# Patient Record
Sex: Male | Born: 1990 | Race: White | Hispanic: No | Marital: Single | Smoking: Never smoker
Health system: Southern US, Community
[De-identification: ages and names within clinical notes are randomized; demographics above are authoritative.]

## PROBLEM LIST (undated history)

## (undated) DIAGNOSIS — Q2381 Bicuspid aortic valve: Secondary | ICD-10-CM

## (undated) DIAGNOSIS — I77810 Thoracic aortic ectasia: Secondary | ICD-10-CM

## (undated) DIAGNOSIS — Q059 Spina bifida, unspecified: Secondary | ICD-10-CM

## (undated) HISTORY — DX: Bicuspid aortic valve: Q23.81

## (undated) HISTORY — DX: Thoracic aortic ectasia: I77.810

---
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
## 2009-02-14 ENCOUNTER — Emergency Department (HOSPITAL_COMMUNITY): Admission: EM | Admit: 2009-02-14 | Discharge: 2009-02-14 | Payer: Self-pay | Admitting: Emergency Medicine

## 2009-10-25 DIAGNOSIS — N319 Neuromuscular dysfunction of bladder, unspecified: Secondary | ICD-10-CM | POA: Insufficient documentation

## 2009-10-25 DIAGNOSIS — G911 Obstructive hydrocephalus: Secondary | ICD-10-CM | POA: Insufficient documentation

## 2009-10-25 DIAGNOSIS — Q051 Thoracic spina bifida with hydrocephalus: Secondary | ICD-10-CM | POA: Insufficient documentation

## 2009-10-25 DIAGNOSIS — K592 Neurogenic bowel, not elsewhere classified: Secondary | ICD-10-CM | POA: Insufficient documentation

## 2009-10-25 DIAGNOSIS — M4120 Other idiopathic scoliosis, site unspecified: Secondary | ICD-10-CM | POA: Insufficient documentation

## 2009-10-25 DIAGNOSIS — J309 Allergic rhinitis, unspecified: Secondary | ICD-10-CM | POA: Insufficient documentation

## 2010-08-17 ENCOUNTER — Ambulatory Visit: Payer: 59 | Attending: Family Medicine | Admitting: Physical Therapy

## 2010-08-17 DIAGNOSIS — R293 Abnormal posture: Secondary | ICD-10-CM | POA: Insufficient documentation

## 2010-08-17 DIAGNOSIS — IMO0001 Reserved for inherently not codable concepts without codable children: Secondary | ICD-10-CM | POA: Insufficient documentation

## 2010-08-17 DIAGNOSIS — R262 Difficulty in walking, not elsewhere classified: Secondary | ICD-10-CM | POA: Insufficient documentation

## 2010-09-24 LAB — BASIC METABOLIC PANEL
Calcium: 9.4 mg/dL (ref 8.4–10.5)
GFR calc Af Amer: >60 mL/min (ref >60)
GFR calc non Af Amer: >60 mL/min (ref >60)
Glucose, Bld: 89 mg/dL (ref 70–99)
Potassium: 3.5 mEq/L (ref 3.5–5.1)
Sodium: 138 mEq/L (ref 135–145)

## 2010-09-24 LAB — CBC
HCT: 45.4 % (ref 39.0–52.0)
Hemoglobin: 15.4 g/dL (ref 13.0–17.0)
RBC: 4.97 MIL/uL (ref 4.22–5.81)
RDW: 13.1 % (ref 11.5–15.5)

## 2010-09-24 LAB — URINALYSIS, ROUTINE W REFLEX MICROSCOPIC
Bilirubin Urine: NEGATIVE
Hgb urine dipstick: NEGATIVE
Nitrite: NEGATIVE
Specific Gravity, Urine: 1.017 (ref 1.005–1.030)
pH: 6.5 (ref 5.0–8.0)

## 2010-09-24 LAB — DIFFERENTIAL
Eosinophils Relative: 0 % (ref 0–5)
Lymphocytes Relative: 12 % (ref 12–46)
Lymphs Abs: 1.5 10*3/uL (ref 0.7–4.0)
Monocytes Absolute: 0.9 10*3/uL (ref 0.1–1.0)
Monocytes Relative: 7 % (ref 3–12)
Neutro Abs: 10.3 10*3/uL — ABNORMAL HIGH (ref 1.7–7.7)

## 2012-04-25 DIAGNOSIS — G919 Hydrocephalus, unspecified: Secondary | ICD-10-CM | POA: Insufficient documentation

## 2012-05-10 DIAGNOSIS — M216X9 Other acquired deformities of unspecified foot: Secondary | ICD-10-CM | POA: Insufficient documentation

## 2014-03-05 ENCOUNTER — Ambulatory Visit: Payer: 59 | Attending: Family Medicine | Admitting: Physical Therapy

## 2014-03-05 DIAGNOSIS — IMO0001 Reserved for inherently not codable concepts without codable children: Secondary | ICD-10-CM | POA: Diagnosis present

## 2014-03-05 DIAGNOSIS — M6281 Muscle weakness (generalized): Secondary | ICD-10-CM | POA: Diagnosis not present

## 2014-03-20 DIAGNOSIS — H5015 Alternating exotropia: Secondary | ICD-10-CM | POA: Insufficient documentation

## 2015-01-06 DIAGNOSIS — Z9889 Other specified postprocedural states: Secondary | ICD-10-CM | POA: Insufficient documentation

## 2019-02-18 DIAGNOSIS — N200 Calculus of kidney: Secondary | ICD-10-CM | POA: Insufficient documentation

## 2019-07-31 ENCOUNTER — Ambulatory Visit: Payer: Medicaid Other | Admitting: Physical Therapy

## 2019-09-02 ENCOUNTER — Other Ambulatory Visit: Payer: Self-pay

## 2019-09-02 ENCOUNTER — Ambulatory Visit: Payer: Medicaid Other | Attending: Family Medicine | Admitting: Physical Therapy

## 2019-09-02 DIAGNOSIS — R2689 Other abnormalities of gait and mobility: Secondary | ICD-10-CM | POA: Insufficient documentation

## 2019-09-02 DIAGNOSIS — M6281 Muscle weakness (generalized): Secondary | ICD-10-CM | POA: Insufficient documentation

## 2019-09-03 ENCOUNTER — Encounter: Payer: Self-pay | Admitting: Physical Therapy

## 2019-09-03 NOTE — Therapy (Signed)
[facility] [address] [place], [place], [address] Phone: [phone]   Fax:  [phone]  Physical Therapy Evaluation  Patient Details  Name: Samuel Becker MRN: [id number] Date of Birth: July 10, 1990 Referring Provider (PT): Suzanna Obey, MD   Encounter Date: 09/02/2019  PT End of Session - 09/03/19 1903    Visit Number  1    PT Start Time  8887    PT Stop Time  5797    PT Time Calculation (min)  72 min    Activity Tolerance  Patient tolerated treatment well    Behavior During Therapy  [facility] for tasks assessed/performed       History reviewed. No pertinent past medical history.  History reviewed. No pertinent surgical history.  There were no vitals filed for this visit.   Subjective Assessment - 09/03/19 1900    Subjective  Pt presents for manual wheelchair eval accompanied by his father - K5 ultralightweight manual wheelchair recommended ([employer])    Patient is accompained by:  Family member   father   Pertinent History  spina bifida    Patient Stated Goals  obtain new manual wheelchair    Currently in Pain?  No/denies         [facility] PT Assessment - 09/03/19 0001      Assessment   Medical Diagnosis  Spina Bifida    Referring Provider (PT)  Suzanna Obey, MD    Onset Date/Surgical Date  --   Congenital     Precautions   Precautions  Fall      Balance Screen   Has the patient fallen in the past 6 months  No    Has the patient had a decrease in activity level because of a fear of falling?   No    Is the patient reluctant to leave their home because of a fear of falling?   No                Objective measurements completed on examination: See above findings.         Mobility/Seating Evaluation    PATIENT INFORMATION: Name: Samuel Becker DOB: May 24, 1991  Sex: M Date seen: 09-02-19 Time: 1230  Address:  [address]                 [place], [place] [address] Physician: Suzanna Obey, MD This evaluation/justification form will serve as the LMN for the following suppliers: __________________________ Supplier: [employer] Contact Person: Deberah Pelton, PT Phone:  [phone]   Seating Therapist: Guido Sander, PT Phone:   [phone]   Phone: [phone]    Spouse/Parent/Caregiver name: Samuel Becker   Phone number: [phone] Insurance/Payer: Medicaid      Reason for Referral: manual wheelchair evaluation    Patient/Caregiver Goals: obtain new manual wheelchair for independence with mobility  Patient was seen for face-to-face evaluation for new manual wheelchair.  Also present was Deberah Pelton, ATP with [employer] to discuss recommendations and wheelchair options.  Further paperwork was completed and sent to vendor.  Patient appears to qualify for manual mobility device at this time per objective findings.   MEDICAL HISTORY: Diagnosis: Primary Diagnosis: Spina Bifida Onset: Congenital Diagnosis: ?????   _0 Progressive Disease Relevant past and future surgeries: ?????   Height: 5'4" Weight: 100# Explain recent changes or trends in weight: ?????   History including Falls: Pt reports no falls within past 6 months; pt is using manual wheelchair for independence with mobility  HOME ENVIRONMENT: _0 House  _1 Condo/town home  _2 Apartment  _3 Assisted Living    _4 Lives Alone _5  Lives with Others                                                                                          Hours with caregiver: 18+  _6 Home is accessible to patient           Stairs      _7 Yes _8  No     Ramp _9 Yes _10 No Comments:  pt lives in basement apartment of family home   COMMUNITY ADL: TRANSPORTATION: _11 Car    _12 Van    <GPQDIYMEBRAXENMM>_7<\/WKGSUPJSRPRXYVOP>_92 Public Transportation    _14 Adapted w/c Lift    _15 Ambulance    _16 Other:       _17 Sits in wheelchair during transport  Employment/School: previously volunteered at [employer]  Specific requirements pertaining to mobility ?????  Other: pt previously rode SCAT   prior to Covid - sits in wheelchair during transport when using SCAT      FUNCTIONAL/SENSORY PROCESSING SKILLS:  Handedness:   _18 Right     _19 Left    _20 NA  Comments:  pt uses bil. UE's to propel manual wheelchair  Functional Processing Skills for Wheeled Mobility _21 Processing Skills are adequate for safe wheelchair operation  Areas of concern than may interfere with safe operation of wheelchair Description of problem   _22  Attention to environment      _23 Judgment      _24  Hearing  _25  Vision or visual processing      _26 Motor Planning  _27  Fluctuations in Behavior  ?????    VERBAL COMMUNICATION: _28 WFL receptive _29  WFL expressive _30 Understandable  _31 Difficult to understand  _32 non-communicative _33  Uses an augmented communication device  CURRENT SEATING / MOBILITY: Current Mobility Base:  _34 None _35 Dependent _36 Manual _37 Scooter _38 Power  Type of Control: ?????  Manufacturer:  Leticia Penna TiLiteSize:  16 x 16 Age: 35 yrs  Current Condition of Mobility Base:  in disrepair   Current Wheelchair components:  ?????  Describe posture in present seating system:  ?????      SENSATION and SKIN ISSUES: Sensation _39 Intact  _40 Impaired _41 Absent  Level of sensation: impaired sensation in bil. feet per pt report Pressure Relief: Able to perform effective pressure relief :    _42 Yes  _43  No Method: ???? If not, Why?: ?????  Skin Issues/Skin Integrity Current Skin Issues  _44 Yes _45 No _46 Intact _47  Red area_48  Open Area  _49 Scar Tissue _50 At risk from prolonged sitting Where  ?????  History of Skin Issues  _51 Yes _52 No Where  ????? When  ?????  Hx of skin flap surgeries  _53 Yes _54 No Where  ????? When  ?????  Limited sitting tolerance _55 Yes _56 No Hours spent sitting in wheelchair daily: 15+  Complaint of Pain:  Please describe: None   Swelling/Edema: none   ADL STATUS (in reference to wheelchair use):  Indep Assist Unable Indep with Equip Not assessed Comments  Dressing ????? ????? ????? X ?????  performs from wheelchair  Eating X ????? ????? ????? ????? ?????  Toileting ????? X ????? ????? ????? has a Pristine unit; caregiver pumps system up & then unhooks it  Bathing ????? ????? ?????  X ????? has a roll in shower; has a rolling shower chair   Grooming/Hygiene ????? ????? ????? X ????? performs from wheelchair  Meal Prep ????? X ????? X ????? performs from wheelchair for light meal prep  IADLS ????? X ????? ????? ????? supervision for safety  Bowel Management: _0 Continent  _1 Incontinent  _2 Accidents Comments:  ?????  Bladder Management: _3 Continent  _4 Incontinent  _5 Accidents Comments:  ?????     WHEELCHAIR SKILLS: Manual w/c Propulsion: _6 UE or LE strength and endurance sufficient to participate in ADLs using manual wheelchair Arm : _7 left _8 right   _9 Both      Distance: 100'+  Foot:  _10 left _11 right   _12 Both  Operate Scooter: _13  Strength, hand grip, balance and transfer appropriate for use _14 Living environment is accessible for use of scooter  Operate Power w/c:  _15  Std. Joystick   _16  Alternative Controls Indep _17  Assist _18  Dependent/unable _19  N/A _20   _21 Safe          _22  Functional      Distance: ?????  Bed confined without wheelchair _23  Yes _24  No   STRENGTH/RANGE OF MOTION:  Active  Range of Motion Strength  Shoulder bil. shoulder flexion and abdct. WNL's Bil. shoulder flexors 4+/5;  Rt shoulder abdct = 3+/5:  Lt = 4/5  Elbow bil. elbow AROM WNL's 5/5 bil. elbow flexors & extensors  Wrist/Hand bil. wrist/hand AROM WNL's WNL's bil. wrist and hand musc.  Hip PROM WFL's - AROM is decreased due to weakness bil. hip flexors 2+/5  Knee Rt passive knee extension -30 degrees:  Lt knee passive knee ext. -38 degrees; min. active knee extension and flexion due to weakness bil. quads 2 - 2+/5  Ankle No active ankle ROM; passive dorsiflexion to neutral 1/5 bil. ankle musc.     MOBILITY/BALANCE:  _25  Patient is totally dependent for mobility  ?????    Balance Transfers  Ambulation  Sitting Balance: Standing Balance: _26  Independent _27  Independent/Modified Independent  _28  WFL     _29  WFL _30  Supervision _31  Supervision  _32  Uses UE for balance  _33  Supervision _34  Min Assist _35  Ambulates with Assist  ?????    _36  Min Assist _37  Min assist _38  Mod Assist _39  Ambulates with Device:      _40  RW  _41  StW  _42  Cane  _43  ?????  _44  Mod Assist _45  Mod assist _46  Max assist   _47  Max Assist _48  Max assist _49  Dependent _50  Indep. Short Distance Only  _51  Unable _52  Unable _53  Lift / Sling Required Distance (in feet)  ?????   _54  Sliding board _55  Unable to Ambulate (see explanation below)  Cardio Status:  _56 Intact  _57  Impaired   _58  NA     ?????  Respiratory Status:  _59 Intact   _60 Impaired   _61 NA     ?????  Orthotics/Prosthetics: pt wearing bil. AFO's  Comments (Address manual vs power w/c vs scooter): Pt requires a K5 ultra lightweight manual wheelchair for independence and safety with mobility and ADL's in his home environment.  Pt requires an ultra lightweight wheelchair for ease with propulsion on all surfaces and terrains.  An ultra lightweight manual wheelchair is also needed for axle adjustability for increased stability for postural control for Mr. Breighner due to his decreased trunk control and trunk musculature weakness.           Anterior / Posterior Obliquity Rotation-Pelvis ?????  PELVIS    _62  _63  _64   Neutral Posterior Anterior  _65  _66  _67   WFL Rt  elev Lt elev  _0  _1  _2   WFL Right Left                      Anterior    Anterior     _3  Fixed _4  Other _5  Partly Flexible _6  Flexible   _7  Fixed _8  Other _9  Partly Flexible  _10  Flexible  _11  Fixed _12  Other _13  Partly Flexible  _14  Flexible   TRUNK  _15  _16  _17   WFL ? Thoracic ? Lumbar  Kyphosis Lordosis  _18  _19  _20   WFL Convex Convex  Right Left _21 c-curve _22 s-curve _23 multiple  _24  Neutral _25  Left-anterior _26  Right-anterior     _27  Fixed _28  Flexible _29  Partly Flexible _30  Other  _31  Fixed _32  Flexible _33  Partly  Flexible _34  Other  _35  Fixed             _36  Flexible _37  Partly Flexible _38  Other    Position Windswept  LLE shorter than RLE  HIPS          _39            _40               _41    Neutral       Abduct        ADduct         _42           _43            _44   Neutral Right           Left      _45  Fixed _46  Subluxed _47  Partly Flexible _48  Dislocated _49  Flexible  _50  Fixed _51  Other _52  Partly Flexible  _53  Flexible                 Foot Positioning Knee Positioning  Rt foot is externally rotated - (pt in seated position)    _54  WFL  _55 Lt _56 Rt _57  WFL  _58 Lt _59 Rt    KNEES ROM concerns: ROM concerns:    & Dorsi-Flexed _60 Lt _61 Rt ?????    FEET Plantar Flexed _62 Lt _63 Rt      Inversion                 _64 Lt _65 Rt      Eversion                 _66 Lt _67 Rt     HEAD _68  Functional _69  Good Head Control  ?????  & _70  Flexed         _71  Extended _72  Adequate Head Control    NECK _73  Rotated  Lt  _74  Lat Flexed Lt _75  Rotated  Rt _76  Lat Flexed Rt _77  Limited Head Control     _78  Cervical Hyperextension _79  Absent  Head Control     SHOULDERS ELBOWS WRIST& HAND ?????      Left     Right    Left     Right    Left     Right   U/E _80 Functional           _81 Functional WNL's WNL's _82 Fisting             _83 Fisting      _84 elev   _85 dep      _86 elev   _87 dep       _88 pro -_89 retract     _90 pro  _91 retract _92 subluxed             _93 subluxed  Goals for Wheelchair Mobility  _0  Independence with mobility in the home with motor related ADLs (MRADLs)  _1  Independence with MRADLs in the community _2  Provide dependent mobility  _3  Provide recline     _4 Provide tilt   Goals for Seating system _5  Optimize pressure distribution _6  Provide support needed to facilitate function or safety _7  Provide corrective forces to assist with maintaining or improving posture _8  Accommodate client's posture:   current seated postures and positions are not flexible or will not tolerate corrective forces _9  Client to be independent with  relieving pressure in the wheelchair _10 Enhance physiological function such as breathing, swallowing, digestion  Simulation ideas/Equipment trials:????? State why other equipment was unsuccessful:?????   MOBILITY BASE RECOMMENDATIONS and JUSTIFICATION: MOBILITY COMPONENT JUSTIFICATION  Manufacturer: TiLiteModel: Aero X    Size: Width 14Seat Depth 15 _11 provide transport from point A to B      _12 promote Indep mobility  _13 is not a safe, functional ambulator _14 walker or cane inadequate _15 non-standard width/depth necessary to accommodate anatomical measurement _16  ?????  _17 Manual Mobility Base _18 non-functional ambulator    _19 Scooter/POV  _20 can safely operate  _21 can safely transfer   _22 has adequate trunk stability  _23 cannot functionally propel manual w/c  _24 Power Mobility Base  _25 non-ambulatory  _26 cannot functionally propel manual wheelchair  _27  cannot functionally and safely operate scooter/POV _28 can safely operate and willing to  _29 Stroller Base _30 infant/child  _31 unable to propel manual wheelchair _32 allows for growth _33 non-functional ambulator _34 non-functional UE _35 Indep mobility is not a goal at this time  _36 Tilt  _37 Forward _38 Backward _39 Powered tilt  _40 Manual tilt  _41 change position against gravitational force on head and shoulders  _42 change position for pressure relief/cannot weight shift _43 transfers  _44 management of tone _45 rest periods _46 control edema _47 facilitate postural control  _48  ?????  _49 Recline  _50 Power recline on power base _51 Manual recline on manual base  _52 accommodate femur to back angle  _53 bring to full recline for ADL care  _54 change position for pressure relief/cannot weight shift _55 rest periods _56 repositioning for transfers or clothing/diaper /catheter changes _57 head positioning  _58 Lighter weight required _59 self- propulsion  _60 lifting _61  ?????  _62 Heavy Duty required _63 user weight greater than 250# _64 extreme tone/ over active movement _65 broken frame on  previous chair _66  ?????  _67  Back  _68  Angle Adjustable _69  Custom molded Acta Relief by [place] - 14"x16"  _70 postural control _71 control of tone/spasticity _72 accommodation of range of motion _73 UE functional control _74 accommodation for seating system _75  ????? _76 provide lateral trunk support _77 accommodate deformity _78 provide posterior trunk support _79 provide lumbar/sacral support _80 support trunk in midline _81 Pressure relief over spinal processes  _82  Seat Cushion 14"x 14" Jay Fusion with air bladder _83 impaired sensation  _84 decubitus ulcers present _85 history of pressure ulceration _86 prevent pelvic extension _87 low maintenance  _88 stabilize pelvis  _89 accommodate obliquity _90 accommodate multiple deformity _91 neutralize lower extremity position _92 increase pressure distribution _93  at risk with prolonged sitting  _94  Pelvic/thigh support  _95  Lateral thigh guide _96  Distal medial pad  _97  Distal lateral pad _98  pelvis in neutral _99 accommodate pelvis _100  position upper legs _101  alignment _102  accommodate ROM _103  decr adduction _104 accommodate tone _105 removable for transfers _106 decr abduction  _107  Lateral trunk Supports _108  Lt     _109  Rt _110 decrease lateral trunk leaning _111 control tone _112 contour for increased contact _113 safety  _114 accommodate asymmetry _115  ?????  _116  Mounting hardware  _117 lateral trunk supports  _118 back   _119 seat _120 headrest      _121  thigh support _122 fixed   _123 swing away _124 attach seat platform/cushion to w/c frame _125 attach back cushion to w/c frame _126 mount postural  supports _0 mount headrest  _1 swing medial thigh support away _2 swing lateral supports away for transfers  _3  ?????    Armrests  _4 fixed _5 adjustable height _6 removable   _7 swing away  _8 flip back   _9 reclining _10 full length pads _11 desk    _12 pads tubular  _13 provide support with elbow at 90   _14 provide support for w/c tray _15 change of height/angles for variable activities  _16 remove for transfers _17 allow to come closer to table top _18 remove for access to tables _19  ?????  Hangers/ Leg rests  _20 60 _21 70 _22 90 _23 elevating _24 heavy duty  _25 articulating _26 fixed _27 lift off _28 swing away     _29 power _30 provide LE support  _31 accommodate to hamstring tightness _32 elevate legs during recline   _33 provide change in position for Legs _34 Maintain placement of feet on footplate _35 durability _36 enable transfers _37 decrease edema _38 Accommodate lower leg length _39  ?????  Foot support Footplate    <ALPFXTKWIOXBDZHG>_9<\/JMEQASTMHDQQIWLN>_98 Lt  _41  Rt  _42  Center mount _43 flip up     _44 depth/angle adjustable _45 Amputee adapter    _46  Lt     _47  Rt _48 provide foot support _49 accommodate to ankle ROM _50 transfers _51 Provide support for residual extremity _52  allow foot to go under wheelchair base _53  decrease tone  _54  Calf strap for lower leg positioning and for safety   _55  Ankle strap/heel loops _56 support foot on foot support _57 decrease extraneous movement _58 provide input to heel  _59 protect foot  Tires: _60 pneumatic  _61 flat free inserts  _62 solid  _63 decrease maintenance  _64 prevent frequent flats _65 increase shock absorbency _66 decrease pain from road shock _67 decrease spasms from road shock _68  ?????  _69  Headrest  _70 provide posterior head support _71 provide posterior neck support _72 provide lateral head support _73 provide anterior head support _74 support during tilt and recline _75 improve feeding   _76 improve respiration _77 placement of switches _78 safety  _79 accommodate ROM  _80 accommodate tone _81 improve visual orientation  _82  Anterior chest strap _83  Vest _84  Shoulder retractors  _85 decrease forward movement of shoulder _86 accommodation of TLSO _87 decrease forward movement of trunk _88 decrease shoulder elevation _89 added abdominal support _90 alignment _91 assistance with shoulder control  _92  ?????  Pelvic Positioner _93 Belt _94 SubASIS bar _95 Dual Pull _96 stabilize tone _97 decrease falling out of chair/ **will  not Decr potential for sliding due to pelvic tilting _98 prevent excessive rotation _99 pad for protection over boney prominence _100 prominence comfort _101 special pull angle to control rotation _102  ?????  Upper Extremity Support _103 L   _104  R _105 Arm trough    _106 hand support _107  tray       _108 full tray _109 swivel mount _110 decrease edema      _111 decrease subluxation   _112 control tone   _113 placement for AAC/Computer/EADL _114 decrease gravitational pull on shoulders _115 provide midline positioning _116 provide support to increase UE function _117 provide hand support in natural position _118 provide work surface   POWER WHEELCHAIR CONTROLS  _119 Proportional  _120 Non-Proportional Type ????? _121 Left  _122 Right _123 provides access for controlling wheelchair   _124 lacks motor control to operate proportional drive control <XQJJHERDEYCXKGYJ>_8<\/HUDJSHFWYOVZCHYI>_502 unable to understand proportional controls  Actuator Control Module  _126 Single  _127 Multiple   _128 Allow the client to operate the power seat function(s) through the joystick control   _129 Safety Reset Switches _130 Used to change modes and stop the wheelchair when driving in latch mode    _131 Upgraded Electronics   _132 programming for accurate control _133 progressive Disease/changing condition _134 non-proportional drive control needed _135 Needed in order to operate power seat functions through joystick control   _136 Display box _137 Allows user to see in which mode and drive the wheelchair is set  _138 necessary for alternate controls    _139 Digital interface electronics _140 Allows w/c to operate when  using alternative drive controls  <YIRSWNIOEVOJJKKX>_3<\/GHWEXHBZJIRCVELF>_8 ASL Head Array _1 Allows client to operate wheelchair  through switches placed in tri-panel headrest  _2 Sip and puff with tubing kit _3 needed to operate sip and puff drive controls  <BOFBPZWCHENIDPOE>_4<\/MPNTIRWERXVQMGQQ>_7 Upgraded tracking electronics _5 increase safety when driving <YPPJKDTOIZTIWPYK>_9<\/XIPJASNKNLZJQBHA>_1 correct tracking when on uneven surfaces  _7 [facility] for switches or joystick _8 Attaches switches to w/c  _9 Swing away for access or transfers _10 midline for optimal  placement _11 provides for consistent access  _12 Attendant controlled joystick plus mount _13 safety _14 long distance driving <PFXTKWIOXBDZHGDJ>_2<\/EQASTMHDQQIWLNLG>_92 operation of seat functions _16 compliance with transportation regulations _17  ?????    Rear wheel placement/Axle adjustability _18 None _19 semi adjustable _20 fully adjustable  _21 improved UE access to wheels _22 improved stability _23 changing angle in space for improvement of postural stability _24 1-arm drive access <JJHERDEYCXKGYJEH>_6<\/DJSHFWYOVZCHYIFO>_27 amputee pad placement _26  ?????  Wheel rims/ hand rims  _27 metal  _28 plastic coated _29 oblique projections _30 vertical projections _31 Provide ability to propel manual wheelchair  _32  Increase self-propulsion with hand weakness/decreased grasp  Push handles _33 extended  _34 angle adjustable  _35 standard _36 caregiver access _37 caregiver assist _38 allows "hooking" to enable increased ability to perform ADLs or maintain balance  One armed device  _39 Lt   _40 Rt _41 enable propulsion of manual wheelchair with one arm   _42  ?????   Brake/wheel lock extension _43  Lt   _44  Rt _45 increase indep in applying wheel locks   _46 Side guards _47 prevent clothing getting caught in wheel or becoming soiled _48  prevent skin tears/abrasions  Battery: ????? _49 to power wheelchair ?????  Other: Anti-tippers             6" x 1.5" casters x 2            Transit Tie Downs To prevent wheelchair from tipping backward during negotiation of curbs, inclines, etc.  For easier maneuverability of wheelchair on uneven surfaces/terrains  For safety - to allow wheelchair to be secured in vehicle during transit   ?????  The above equipment has a life- long use expectancy. Growth and changes in medical and/or functional conditions would be the exceptions. This is to certify that the therapist has no financial relationship with durable medical provider or manufacturer. The therapist will not receive remuneration of any kind for the equipment recommended in this evaluation.   Patient has mobility limitation  that significantly impairs safe, timely participation in one or more mobility related ADL's.  (bathing, toileting, feeding, dressing, grooming, moving from room to room)                                                             _50  Yes _51  No Will mobility device sufficiently improve ability to participate and/or be aided in participation of MRADL's?         _52  Yes _53  No Can limitation be compensated for with use of a cane or walker?                                                                                _54  Yes _55  No Does patient or caregiver demonstrate ability/potential ability &  willingness to safely use the mobility device?   _0  Yes _1  No Does patient's home environment support use of recommended mobility device?                                                    _2  Yes _3  No Does patient have sufficient upper extremity function necessary to functionally propel a manual wheelchair?    _4  Yes _5  No Does patient have sufficient strength and trunk stability to safely operate a POV (scooter)?                                  _6  Yes _7  No Does patient need additional features/benefits provided by a power wheelchair for MRADL's in the home?       _8  Yes _9  No Does the patient demonstrate the ability to safely use a power wheelchair?                                                              _10  Yes _11  No  Therapist Name Printed: Guido Sander, PT Date: 09-02-19  Therapist's Signature:   Date:   Supplier's Name Printed: Mamm[employer] Date: 09-02-19  Supplier's Signature:   Date:  Patient/Caregiver Signature:   Date:     This is to certify that I have read this evaluation and do agree with the content within:      Physician's Name Printed: Suzanna Obey, MD  28 Signature:  Date:     This is to certify that I, the above signed therapist have the following affiliations: _12  This DME provider _13  Manufacturer of recommended equipment _14  Patient's long term care  facility _15  None of the above                        Plan - 09/03/19 1903    Clinical Impression Statement  Pt is a 29 yr old gentleman with spina bifida, seen for manual wheelchair eval with Deberah Pelton, ATP with [employer] present for consult with eval.  A K5 ultra lightweight manual wheelchair (Aero X TiLite) is recommended.    Personal Factors and Comorbidities  Comorbidity 1    Comorbidities  spina bifida    Examination-Activity Limitations  Locomotion Level    Examination-Participation Restrictions  Community Activity;Interpersonal Relationship;Volunteer;Meal Prep;Laundry;Cleaning    Stability/Clinical Decision Making  Stable/Uncomplicated    Clinical Decision Making  Low    PT Frequency  One time visit    PT Treatment/Interventions  Other (comment)   w/c evaluation   Recommended Other Services  [employer] - manual wheelchair eval - Brandon Sisk, ATP    Consulted and Agree with Plan of Care  Patient;Family member/caregiver    Family Member Consulted  father       Patient will benefit from skilled therapeutic intervention in order to improve the following deficits and impairments:  Decreased mobility, Decreased balance, Decreased strength  Visit Diagnosis: Muscle weakness (generalized) - Plan: PT plan of care cert/re-cert  Other abnormalities of gait and mobility - Plan: PT plan of care  cert/re-cert     Problem List There are no problems to display for this patient.   Alda Lea, PT 09/03/2019, 7:16 PM  [facility] [address] [place], [place], [address] Phone: [phone]   Fax:  [phone]  Name: Samuel Becker MRN: [id number] Date of Birth: [birth year]

## 2021-05-26 ENCOUNTER — Emergency Department (HOSPITAL_BASED_OUTPATIENT_CLINIC_OR_DEPARTMENT_OTHER)
Admission: EM | Admit: 2021-05-26 | Discharge: 2021-05-27 | Disposition: A | Discharge status: Home / Self Care | Payer: Medicaid Other | Attending: Emergency Medicine | Admitting: Emergency Medicine

## 2021-05-26 ENCOUNTER — Emergency Department (HOSPITAL_BASED_OUTPATIENT_CLINIC_OR_DEPARTMENT_OTHER): Payer: Medicaid Other | Admitting: Radiology

## 2021-05-26 ENCOUNTER — Emergency Department (HOSPITAL_BASED_OUTPATIENT_CLINIC_OR_DEPARTMENT_OTHER): Payer: Medicaid Other

## 2021-05-26 ENCOUNTER — Other Ambulatory Visit: Payer: Self-pay

## 2021-05-26 ENCOUNTER — Encounter (HOSPITAL_BASED_OUTPATIENT_CLINIC_OR_DEPARTMENT_OTHER): Payer: Self-pay

## 2021-05-26 DIAGNOSIS — Z20822 Contact with and (suspected) exposure to covid-19: Secondary | ICD-10-CM | POA: Insufficient documentation

## 2021-05-26 DIAGNOSIS — R000 Tachycardia, unspecified: Secondary | ICD-10-CM | POA: Insufficient documentation

## 2021-05-26 DIAGNOSIS — R0602 Shortness of breath: Secondary | ICD-10-CM | POA: Insufficient documentation

## 2021-05-26 DIAGNOSIS — Z91040 Latex allergy status: Secondary | ICD-10-CM | POA: Diagnosis not present

## 2021-05-26 DIAGNOSIS — R109 Unspecified abdominal pain: Secondary | ICD-10-CM | POA: Diagnosis present

## 2021-05-26 HISTORY — DX: Spina bifida, unspecified: Q05.9

## 2021-05-26 LAB — CBC WITH DIFFERENTIAL/PLATELET
Abs Immature Granulocytes: 0.02 10*3/uL (ref 0.00–0.07)
Basophils Absolute: 0 10*3/uL (ref 0.0–0.1)
Basophils Relative: 0 %
Eosinophils Absolute: 0.1 10*3/uL (ref 0.0–0.5)
Eosinophils Relative: 1 %
HCT: 45.6 % (ref 39.0–52.0)
Hemoglobin: 15.3 g/dL (ref 13.0–17.0)
Immature Granulocytes: 0 %
Lymphocytes Relative: 13 %
Lymphs Abs: 0.8 10*3/uL (ref 0.7–4.0)
MCH: 30.5 pg (ref 26.0–34.0)
MCHC: 33.6 g/dL (ref 30.0–36.0)
MCV: 90.8 fL (ref 80.0–100.0)
Monocytes Absolute: 0.7 10*3/uL (ref 0.1–1.0)
Monocytes Relative: 12 %
Neutro Abs: 4.5 10*3/uL (ref 1.7–7.7)
Neutrophils Relative %: 74 %
Platelets: 165 10*3/uL (ref 150–400)
RBC: 5.02 MIL/uL (ref 4.22–5.81)
RDW: 11.9 % (ref 11.5–15.5)
WBC: 6.1 10*3/uL (ref 4.0–10.5)
nRBC: 0 % (ref 0.0–0.2)

## 2021-05-26 MED ORDER — IPRATROPIUM-ALBUTEROL 0.5-2.5 (3) MG/3ML IN SOLN
RESPIRATORY_TRACT | Status: AC
  Administered 2021-05-26: 3 mL via RESPIRATORY_TRACT
  Filled 2021-05-26: qty 3

## 2021-05-26 MED ORDER — SODIUM CHLORIDE 0.9 % IV BOLUS
1000.0000 mL | Freq: Once | INTRAVENOUS | Status: AC
  Administered 2021-05-27: 1000 mL via INTRAVENOUS

## 2021-05-26 MED ORDER — IPRATROPIUM-ALBUTEROL 0.5-2.5 (3) MG/3ML IN SOLN: 3.0000 mL | Freq: Once | RESPIRATORY_TRACT | Status: AC

## 2021-05-26 NOTE — ED Provider Notes (Signed)
[facility] EMERGENCY DEPT Provider Note   CSN: [id number] Arrival date & time: 05/26/21  2328     History Chief Complaint  Patient presents with   Respiratory Distress    Samuel Becker is a 30 y.o. male.  Patient is a 30 year old male with past medical history of spina bifida with recurrent urinary tract infections and performs home self caths.  Patient presenting today for evaluation of pain in his left side/flank for the past 3 days, then developed shortness of breath this evening after returning home from a movie with his parents.  He denies to me that he is having any fevers, chills, or cough.  He denies any ill contacts.  The history is provided by the patient.      Past Medical History:  Diagnosis Date   Spina bifida (HCC)     There are no problems to display for this patient.   No past surgical history on file.     No family history on file.     Home Medications Prior to Admission medications   Not on File    Allergies    Latex and Septra [sulfamethoxazole-trimethoprim]  Review of Systems   Review of Systems  All other systems reviewed and are negative.  Physical Exam Updated Vital Signs BP (!) 145/97 (BP Location: Left Arm)   Pulse (!) 130   Temp 99.7 F (37.6 C) (Oral)   Resp (!) 29   Ht 5\' 4"  (1.626 m)   Wt 4.99 kg   SpO2 99%   BMI 1.89 kg/m   Physical Exam Vitals and nursing note reviewed.  Constitutional:      General: He is not in acute distress.    Appearance: He is well-developed. He is not diaphoretic.  HENT:     Head: Normocephalic and atraumatic.  Cardiovascular:     Rate and Rhythm: Regular rhythm. Tachycardia present.     Heart sounds: No murmur heard.   No friction rub.  Pulmonary:     Effort: Pulmonary effort is normal. No respiratory distress.     Breath sounds: Normal breath sounds. No wheezing or rales.  Abdominal:     General: Bowel sounds are normal. There is no distension.     Palpations:  Abdomen is soft.     Tenderness: There is no abdominal tenderness.  Musculoskeletal:        General: Normal range of motion.     Cervical back: Normal range of motion and neck supple.  Skin:    General: Skin is warm and dry.  Neurological:     Mental Status: He is alert and oriented to person, place, and time.     Coordination: Coordination normal.    ED Results / Procedures / Treatments   Labs (all labs ordered are listed, but only abnormal results are displayed) Labs Reviewed  RESP PANEL BY RT-PCR (FLU A&B, COVID) [id number]  COMPREHENSIVE METABOLIC PANEL  CBC WITH DIFFERENTIAL/PLATELET  LACTIC ACID, PLASMA  LACTIC ACID, PLASMA  BRAIN NATRIURETIC PEPTIDE  TROPONIN I (HIGH SENSITIVITY)    EKG None  Radiology No results found.  Procedures Procedures   Medications Ordered in ED Medications  ipratropium-albuterol (DUONEB) 0.5-2.5 (3) MG/3ML nebulizer solution 3 mL (3 mLs Nebulization Given 05/26/21 2350)    ED Course  I have reviewed the triage vital signs and the nursing notes.  Pertinent labs & imaging results that were available during my care of the patient were reviewed by me and considered in my medical decision  making (see chart for details).    MDM Rules/Calculators/A&P  Patient with history of spina bifida presenting with complaints of left flank pain that started acutely this evening after returning home from a movie.  Patient in some discomfort and hyperventilating upon presentation and was placed on oxygen for comfort.  To my exam and work-up, I suspect this is musculoskeletal in nature.  He has no evidence of urinary tract infection, CT negative for renal calculus or other acute process, and laboratory studies are essentially unremarkable.  At this point, I feel as though patient can safely be discharged.  He is feeling better after receiving Toradol.  I highly suspect a musculoskeletal etiology.  Final Clinical Impression(s) / ED Diagnoses Final diagnoses:   None    Rx / DC Orders ED Discharge Orders     None        Geoffery Lyons, MD 05/27/21 [phone]

## 2021-05-26 NOTE — ED Notes (Signed)
RT placed pt on simple mask 6 Lpm for increased WOB and O2 sat of 93%. Pt respiratory status stable w/moderate respiratory distress at this time. Pt received Duo breathing treatment for SOB. Pt verbalized it helped his breathing. Pt sats on simple mask 98% w/some respiratory distress. RT will continue to monitor.

## 2021-05-26 NOTE — ED Triage Notes (Signed)
Two day history of shortness of breath that became worse app an hour PTA.

## 2021-05-27 ENCOUNTER — Emergency Department (HOSPITAL_BASED_OUTPATIENT_CLINIC_OR_DEPARTMENT_OTHER): Payer: Medicaid Other

## 2021-05-27 LAB — COMPREHENSIVE METABOLIC PANEL
ALT: 18 U/L (ref 0–44)
AST: 20 U/L (ref 15–41)
Albumin: 4.4 g/dL (ref 3.5–5.0)
Alkaline Phosphatase: 42 U/L (ref 38–126)
Anion gap: 10 (ref 5–15)
BUN: 11 mg/dL (ref 6–20)
CO2: 27 mmol/L (ref 22–32)
Calcium: 9.1 mg/dL (ref 8.9–10.3)
Chloride: 97 mmol/L — ABNORMAL LOW (ref 98–111)
Creatinine, Ser: 0.78 mg/dL (ref 0.61–1.24)
GFR, Estimated: >60 mL/min (ref >60)
Glucose, Bld: 102 mg/dL — ABNORMAL HIGH (ref 70–99)
Potassium: 3.7 mmol/L (ref 3.5–5.1)
Sodium: 134 mmol/L — ABNORMAL LOW (ref 135–145)
Total Bilirubin: 0.5 mg/dL (ref 0.3–1.2)
Total Protein: 6.8 g/dL (ref 6.5–8.1)

## 2021-05-27 LAB — URINALYSIS, ROUTINE W REFLEX MICROSCOPIC
Bilirubin Urine: NEGATIVE
Glucose, UA: NEGATIVE mg/dL
Ketones, ur: NEGATIVE mg/dL
Leukocytes,Ua: NEGATIVE
Nitrite: NEGATIVE
Protein, ur: NEGATIVE mg/dL
Specific Gravity, Urine: <1.005 — ABNORMAL LOW (ref 1.005–1.030)
pH: 5.5 (ref 5.0–8.0)

## 2021-05-27 LAB — RESP PANEL BY RT-PCR (FLU A&B, COVID) ARPGX2
Influenza A by PCR: NEGATIVE
Influenza B by PCR: NEGATIVE
SARS Coronavirus 2 by RT PCR: NEGATIVE

## 2021-05-27 LAB — LACTIC ACID, PLASMA: Lactic Acid, Venous: 1.8 mmol/L (ref 0.5–1.9)

## 2021-05-27 LAB — TROPONIN I (HIGH SENSITIVITY): Troponin I (High Sensitivity): 3 ng/L (ref <18)

## 2021-05-27 LAB — BRAIN NATRIURETIC PEPTIDE: B Natriuretic Peptide: 18.6 pg/mL (ref 0.0–100.0)

## 2021-05-27 MED ORDER — KETOROLAC TROMETHAMINE 30 MG/ML IJ SOLN
30.0000 mg | Freq: Once | INTRAMUSCULAR | Status: AC
  Administered 2021-05-27: 30 mg via INTRAVENOUS
  Filled 2021-05-27: qty 1

## 2021-05-27 NOTE — Discharge Instructions (Signed)
Take ibuprofen 400 mg every 6 hours as needed for pain.  Return to the emergency department if you develop worsening pain, difficulty breathing, high fever, bloody stool or vomit, or other new and concerning symptoms.

## 2021-05-28 ENCOUNTER — Other Ambulatory Visit: Payer: Self-pay

## 2021-05-28 ENCOUNTER — Encounter (HOSPITAL_BASED_OUTPATIENT_CLINIC_OR_DEPARTMENT_OTHER): Payer: Self-pay

## 2021-05-28 ENCOUNTER — Emergency Department (HOSPITAL_BASED_OUTPATIENT_CLINIC_OR_DEPARTMENT_OTHER)
Admission: EM | Admit: 2021-05-28 | Discharge: 2021-05-29 | Disposition: A | Discharge status: Home / Self Care | Payer: Medicaid Other | Attending: Emergency Medicine | Admitting: Emergency Medicine

## 2021-05-28 DIAGNOSIS — G8220 Paraplegia, unspecified: Secondary | ICD-10-CM | POA: Diagnosis not present

## 2021-05-28 DIAGNOSIS — R109 Unspecified abdominal pain: Secondary | ICD-10-CM | POA: Insufficient documentation

## 2021-05-28 DIAGNOSIS — K5900 Constipation, unspecified: Secondary | ICD-10-CM | POA: Diagnosis not present

## 2021-05-28 DIAGNOSIS — R0602 Shortness of breath: Secondary | ICD-10-CM | POA: Insufficient documentation

## 2021-05-28 DIAGNOSIS — R140 Abdominal distension (gaseous): Secondary | ICD-10-CM | POA: Insufficient documentation

## 2021-05-28 MED ORDER — IPRATROPIUM-ALBUTEROL 0.5-2.5 (3) MG/3ML IN SOLN
RESPIRATORY_TRACT | Status: AC
  Administered 2021-05-28: 3 mL via RESPIRATORY_TRACT
  Filled 2021-05-28: qty 3

## 2021-05-28 MED ORDER — IPRATROPIUM-ALBUTEROL 0.5-2.5 (3) MG/3ML IN SOLN: 3.0000 mL | Freq: Once | RESPIRATORY_TRACT | Status: AC

## 2021-05-28 NOTE — ED Triage Notes (Addendum)
Pt presents with ongoing SOB x2 days. Audible upper airway wheezing noted in triage. SOB worsens w/exertion. Pt seen here 2 days ago for the same. Had a tele visit w/PCP. Pt had some liquid diarrhea yesterday. No solid BM was Thursday. Pt has had 2 IBS pills and 2 enemas for abd distention and bloating. Pt is passing gas   Mom also reports mucus in his urine when he self cath

## 2021-05-28 NOTE — ED Notes (Signed)
RT gave pt duo nebulizer for wheezing and placed him on Simple mask for WOB and sats of 91% on RA. Pt states mask helps him breathe better. Pts respiratory status stable w/no distress noted w/mild increased WOB. RT will continue to monitor.

## 2021-05-29 ENCOUNTER — Emergency Department (HOSPITAL_BASED_OUTPATIENT_CLINIC_OR_DEPARTMENT_OTHER): Payer: Medicaid Other

## 2021-05-29 LAB — URINALYSIS, ROUTINE W REFLEX MICROSCOPIC
Bilirubin Urine: NEGATIVE
Glucose, UA: NEGATIVE mg/dL
Ketones, ur: 40 mg/dL — AB
Nitrite: NEGATIVE
Specific Gravity, Urine: 1.045 — ABNORMAL HIGH (ref 1.005–1.030)
pH: 6 (ref 5.0–8.0)

## 2021-05-29 LAB — CBC
HCT: 40.8 % (ref 39.0–52.0)
Hemoglobin: 13.7 g/dL (ref 13.0–17.0)
MCH: 30.6 pg (ref 26.0–34.0)
MCHC: 33.6 g/dL (ref 30.0–36.0)
MCV: 91.3 fL (ref 80.0–100.0)
Platelets: 169 10*3/uL (ref 150–400)
RBC: 4.47 MIL/uL (ref 4.22–5.81)
RDW: 12.1 % (ref 11.5–15.5)
WBC: 5.3 10*3/uL (ref 4.0–10.5)
nRBC: 0 % (ref 0.0–0.2)

## 2021-05-29 LAB — COMPREHENSIVE METABOLIC PANEL
ALT: 15 U/L (ref 0–44)
AST: 15 U/L (ref 15–41)
Albumin: 3.9 g/dL (ref 3.5–5.0)
Alkaline Phosphatase: 33 U/L — ABNORMAL LOW (ref 38–126)
Anion gap: 8 (ref 5–15)
BUN: 12 mg/dL (ref 6–20)
CO2: 27 mmol/L (ref 22–32)
Calcium: 9 mg/dL (ref 8.9–10.3)
Chloride: 104 mmol/L (ref 98–111)
Creatinine, Ser: 0.64 mg/dL (ref 0.61–1.24)
GFR, Estimated: >60 mL/min (ref >60)
Glucose, Bld: 84 mg/dL (ref 70–99)
Potassium: 3.8 mmol/L (ref 3.5–5.1)
Sodium: 139 mmol/L (ref 135–145)
Total Bilirubin: 0.4 mg/dL (ref 0.3–1.2)
Total Protein: 6.2 g/dL — ABNORMAL LOW (ref 6.5–8.1)

## 2021-05-29 LAB — LIPASE, BLOOD: Lipase: 24 U/L (ref 11–51)

## 2021-05-29 MED ORDER — BISACODYL 5 MG PO TBEC
5.0000 mg | DELAYED_RELEASE_TABLET | Freq: Once | ORAL | Status: AC
  Administered 2021-05-29: 5 mg via ORAL
  Filled 2021-05-29: qty 1

## 2021-05-29 MED ORDER — IOHEXOL 300 MG/ML  SOLN
100.0000 mL | Freq: Once | INTRAMUSCULAR | Status: AC | PRN
  Administered 2021-05-29: 70 mL via INTRAVENOUS

## 2021-05-29 NOTE — ED Provider Notes (Signed)
[facility] EMERGENCY DEPT Provider Note   CSN: [id number] Arrival date & time: 05/28/21  2309     History Chief Complaint  Patient presents with   Abdominal Pain   Constipation   Shortness of Breath    Samuel Becker is a 30 y.o. male.  The history is provided by the patient and a parent.  Abdominal Pain Associated symptoms: constipation and shortness of breath   Constipation Associated symptoms: abdominal pain   Shortness of Breath Associated symptoms: abdominal pain   He has history of spina bifida with paraplegia and comes in complaining of abdominal pain and distention which has been present throughout the day today.  At its worst, pain was rated at 10/10.  He tried giving himself an enema but was unsuccessful.  His father tried to assist with an enema and stated it felt like there was pressure preventing insertion of the enema tube, but he was successful in giving 2 enemas.  Patient does normally require an aggressive bowel regimen to prevent constipation.  He denies fever or chills.  There has been some nausea but no vomiting.   Past Medical History:  Diagnosis Date   Spina bifida (HCC)     There are no problems to display for this patient.   History reviewed. No pertinent surgical history.     History reviewed. No pertinent family history.  Social History   Tobacco Use   Smoking status: Never   Smokeless tobacco: Never    Home Medications Prior to Admission medications   Not on File    Allergies    Latex and Septra [sulfamethoxazole-trimethoprim]  Review of Systems   Review of Systems  Respiratory:  Positive for shortness of breath.   Gastrointestinal:  Positive for abdominal pain and constipation.  All other systems reviewed and are negative.  Physical Exam Updated Vital Signs BP 122/85   Pulse 94   Temp 97.9 F (36.6 C) (Oral)   Resp (!) 21   Wt 47.4 kg   SpO2 100%   BMI 17.94 kg/m   Physical Exam Vitals and  nursing note reviewed.  30 year old male, resting comfortably and in no acute distress. Vital signs are significant for borderline elevated respiratory rate. Oxygen saturation is 100%, which is normal. Head is normocephalic and atraumatic. PERRLA, EOMI. Oropharynx is clear. Neck is nontender and supple without adenopathy or JVD. Back is nontender and there is no CVA tenderness. Lungs are clear without rales, wheezes, or rhonchi. Chest is nontender. Heart has regular rate and rhythm without murmur. Abdomen is soft, slightly distended, nontender without masses or hepatosplenomegaly and peristalsis is hypoactive.. Rectal: No impaction, no stool present in the rectal ampulla. Extremities have no cyanosis or edema. Skin is warm and dry without rash. Neurologic: Mental status is normal, cranial nerves are intact, paraplegia present.  ED Results / Procedures / Treatments   Labs (all labs ordered are listed, but only abnormal results are displayed) Labs Reviewed  COMPREHENSIVE METABOLIC PANEL - Abnormal; Notable for the following components:      Result Value   Total Protein 6.2 (*)    Alkaline Phosphatase 33 (*)    All other components within normal limits  URINALYSIS, ROUTINE W REFLEX MICROSCOPIC - Abnormal; Notable for the following components:   Specific Gravity, Urine 1.045 (*)    Hgb urine dipstick TRACE (*)    Ketones, ur 40 (*)    Protein, ur TRACE (*)    Leukocytes,Ua SMALL (*)    All  other components within normal limits  URINE CULTURE  LIPASE, BLOOD  CBC   Radiology CT ABDOMEN PELVIS W CONTRAST  Result Date: 05/29/2021 CLINICAL DATA:  Concern for bowel obstruction. EXAM: CT ABDOMEN AND PELVIS WITH CONTRAST TECHNIQUE: Multidetector CT imaging of the abdomen and pelvis was performed using the standard protocol following bolus administration of intravenous contrast. CONTRAST:  70mL OMNIPAQUE IOHEXOL 300 MG/ML  SOLN COMPARISON:  CT abdomen pelvis dated 05/27/2021. FINDINGS:  Evaluation is very limited due to scoliosis and streak artifact caused by spinal hardware. Lower chest: The visualized lung bases are clear. There is cardiomegaly. No intra-abdominal free air or free fluid. Hepatobiliary: No focal liver abnormality is seen. No gallstones, gallbladder wall thickening, or biliary dilatation. Pancreas: Unremarkable. No pancreatic ductal dilatation or surrounding inflammatory changes. Spleen: Normal in size without focal abnormality. Adrenals/Urinary Tract: The kidneys are unremarkable. The urinary bladder is unremarkable. Stomach/Bowel: The stomach is distended. There is no bowel obstruction. The appendix is normal. Vascular/Lymphatic: The abdominal aorta and IVC unremarkable. No portal venous gas. There is no adenopathy. Reproductive: The prostate and seminal vesicles are grossly unremarkable. Other: Partially visualized VP shunt with tip in the pelvis. Musculoskeletal: Severe scoliosis and posterior fusion hardware. There is non fusion of the posterior elements consistent with spina bifida. No acute osseous pathology. Chronic subluxation of the right hip. IMPRESSION: No acute intra-abdominal or pelvic pathology. No bowel obstruction. Normal appendix. Electronically Signed   By: Elgie Collard M.D.   On: 05/29/2021 01:54    Procedures Procedures   Medications Ordered in ED Medications  iohexol (OMNIPAQUE) 300 MG/ML solution 100 mL (has no administration in time range)  ipratropium-albuterol (DUONEB) 0.5-2.5 (3) MG/3ML nebulizer solution 3 mL (3 mLs Nebulization Given 05/28/21 2338)    ED Course  I have reviewed the triage vital signs and the nursing notes.  Pertinent labs & imaging results that were available during my care of the patient were reviewed by me and considered in my medical decision making (see chart for details).   MDM Rules/Calculators/A&P                         Abdominal pain and distention of uncertain cause.  Patient was concerned that he may  have been constipated, no evidence of that on examination.  Old records are reviewed showing he had been in the ED 2 days ago with flank pain and had a renal stone protocol CT scan which was unremarkable.  Today's symptoms are distinctly different.  I am concerned about possible ileus or obstruction, so we will need to repeat CT scan.  CT scan today will need to be done with oral and IV contrast.  Labs did show a slight drop in hemoglobin compared with 2 days ago, but he has no evidence of any blood loss.  Urinalysis does have 21-50 WBCs and has been sent for culture.  CT scan showed no evidence of obstruction or ileus.  I reviewed the images and there is some significant gaseous distention of the colon.  No evidence of impaction.  He is given a dose of bisacodyl and is advised to continue his home bowel regimen.  Return precautions discussed.  Final Clinical Impression(s) / ED Diagnoses Final diagnoses:  Abdominal pain, unspecified abdominal location  Abdominal distension  Paraplegia [facility])    Rx / DC Orders ED Discharge Orders     None        Dione Booze, MD 05/29/21 [phone]

## 2021-05-29 NOTE — Discharge Instructions (Signed)
Your blood tests and CT scan did not show any sign of any serious problem.  Please continue to work on having regular bowel movements.  Consider using fiber supplements and/or polyethylene glycol (MiraLAX) as needed.  Continue to work with your specialists.  If you continue to have problems with your bowels, consider requesting a referral to a gastroenterologist.  Return to the emergency department if you have any new or concerning symptoms.

## 2021-05-31 LAB — URINE CULTURE: Culture: 90000 — AB

## 2021-06-01 ENCOUNTER — Telehealth: Payer: Self-pay | Admitting: *Deleted

## 2021-06-01 NOTE — Telephone Encounter (Signed)
Post ED Visit - Positive Culture Follow-up  Culture report reviewed by antimicrobial stewardship pharmacist: [facility] []  , Pharm.D. []  Enzo Bi, Pharm.D., BCPS AQ-ID []  , Pharm.D., BCPS []  Celedonio Miyamoto, Pharm.D., BCPS []  [place], Garvin Fila.D., BCPS, AAHIVP []  , Pharm.D., BCPS, AAHIVP []  Georgina Pillion, PharmD, BCPS []  , PharmD, BCPS []  [place], PharmD, BCPS []  [address], PharmD []  , PharmD, BCPS []  Estella Husk, PharmD  Pharmacy Team []  Lysle Pearl, PharmD []  , PharmD []  Phillips Climes, PharmD []  , Rph []  Agapito Games) , PharmD []  Verlan Friends, PharmD []  , PharmD []  Mervyn Gay, PharmD []  , PharmD []  Vinnie Level, PharmD []  [facility], PharmD []  , PharmD []  Len Childs, PharmD   Positive urine culture No antibiotics indicated at this time and no further patient follow-up is required at this time.  , PharmD  Greer Pickerel Talley 06/01/2021, 8:18 AM

## 2021-12-13 ENCOUNTER — Other Ambulatory Visit (HOSPITAL_BASED_OUTPATIENT_CLINIC_OR_DEPARTMENT_OTHER): Payer: Self-pay | Admitting: Family Medicine

## 2021-12-13 ENCOUNTER — Other Ambulatory Visit (HOSPITAL_BASED_OUTPATIENT_CLINIC_OR_DEPARTMENT_OTHER): Payer: Medicare Other

## 2021-12-13 ENCOUNTER — Ambulatory Visit (HOSPITAL_BASED_OUTPATIENT_CLINIC_OR_DEPARTMENT_OTHER)
Admission: RE | Admit: 2021-12-13 | Discharge: 2021-12-13 | Disposition: A | Discharge status: Home / Self Care | Payer: Medicare Other | Source: Ambulatory Visit | Attending: Family Medicine | Admitting: Family Medicine

## 2021-12-13 DIAGNOSIS — J209 Acute bronchitis, unspecified: Secondary | ICD-10-CM

## 2021-12-13 DIAGNOSIS — J069 Acute upper respiratory infection, unspecified: Secondary | ICD-10-CM | POA: Diagnosis present

## 2022-04-29 IMAGING — CT CT RENAL STONE PROTOCOL
3 of 4 series · 7 of 46 positions shown, 13 images · non-contrast
Comparison: None.

CLINICAL DATA: Flank pain, nephrolithiasis

EXAM:
CT ABDOMEN AND PELVIS WITHOUT CONTRAST
TECHNIQUE: Multidetector CT imaging of the abdomen and pelvis was performed
following the standard protocol without IV contrast.

[Series 4: lungs · axial · 0.71mm/px · z∈[+1181,+1216]mm · 3 of 15 slices shown, 7 images]
[im 4/15  soft-tissue]
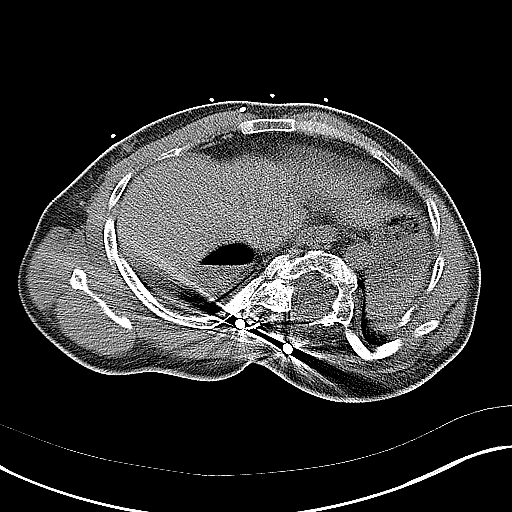
[im 4/15  lung]
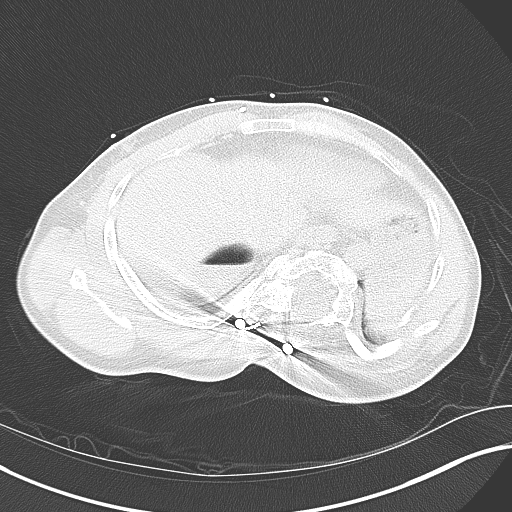
[im 4/15  bone]
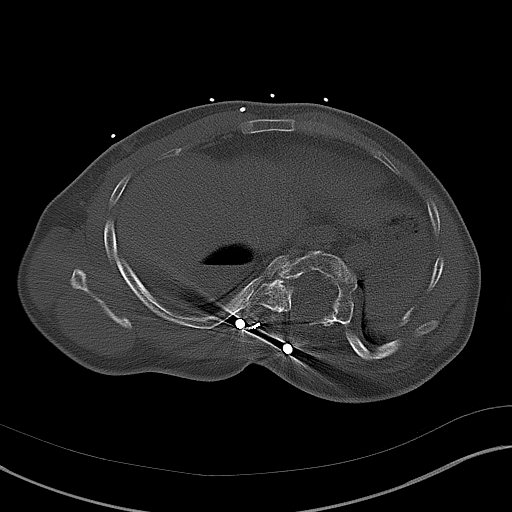
[im 8/15  soft-tissue]
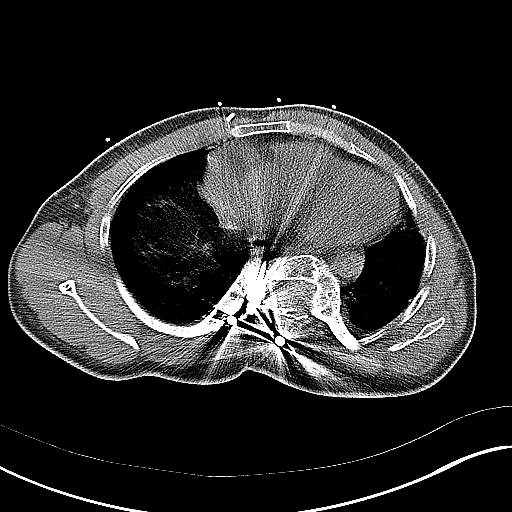
[im 8/15  lung]
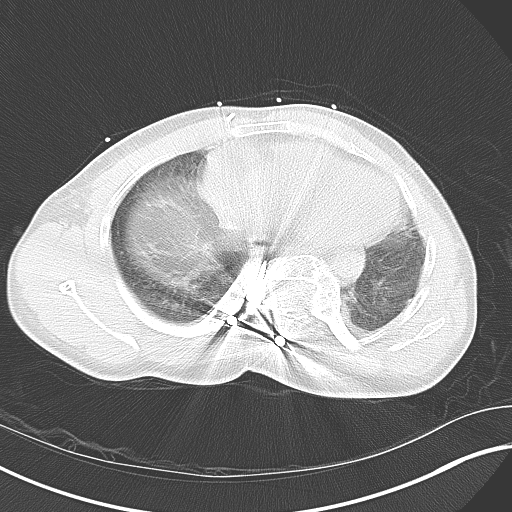
[im 11/15  soft-tissue]
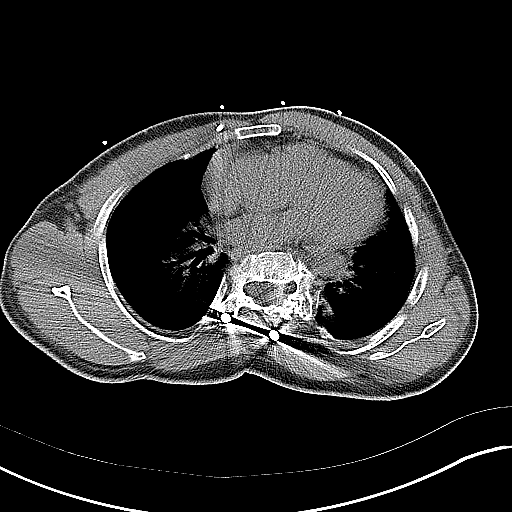
[im 11/15  lung]
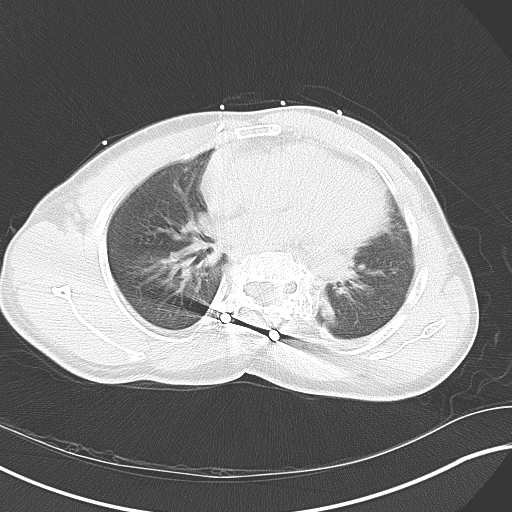

[Series 5: coronal · coronal · 0.71mm/px · 3 of 99 slices shown, 4 images]
[im 33/99  soft-tissue]
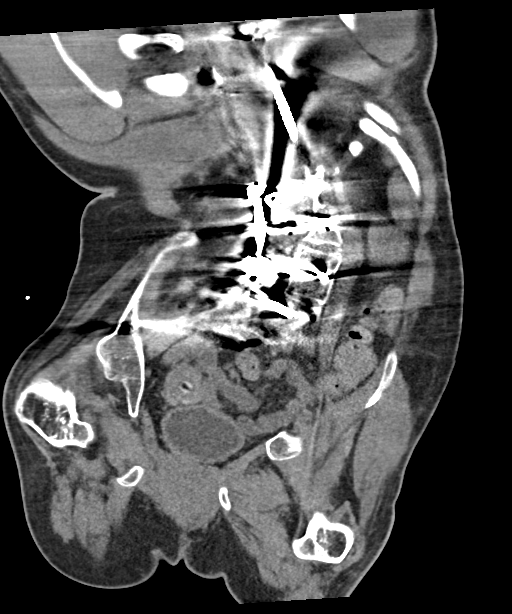
[im 44/99  soft-tissue]
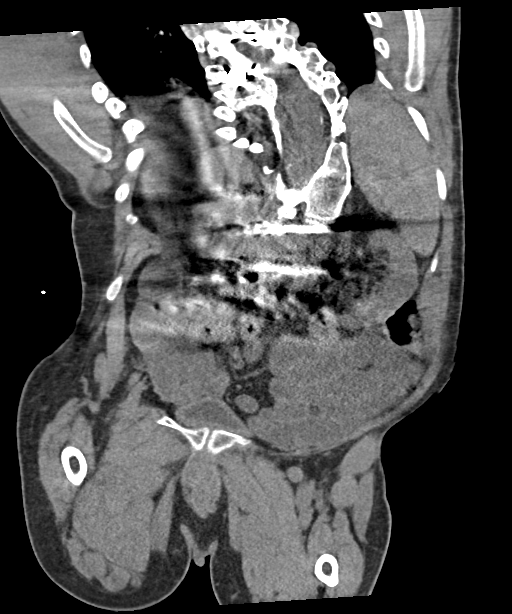
[im 44/99  bone]
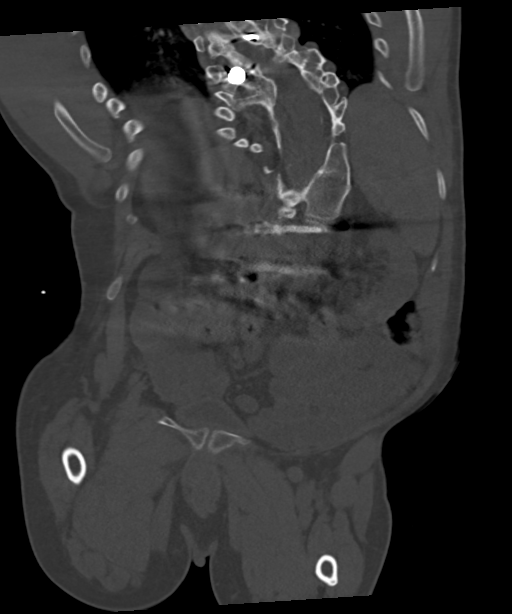
[im 55/99  soft-tissue]
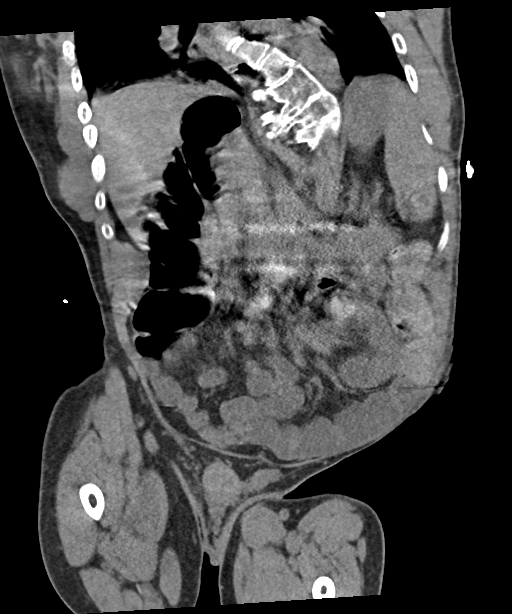

[Series 6: sagittal · sagittal · 0.64mm/px · 1 of 110 slices shown, 2 images]
[im 37/110  soft-tissue]
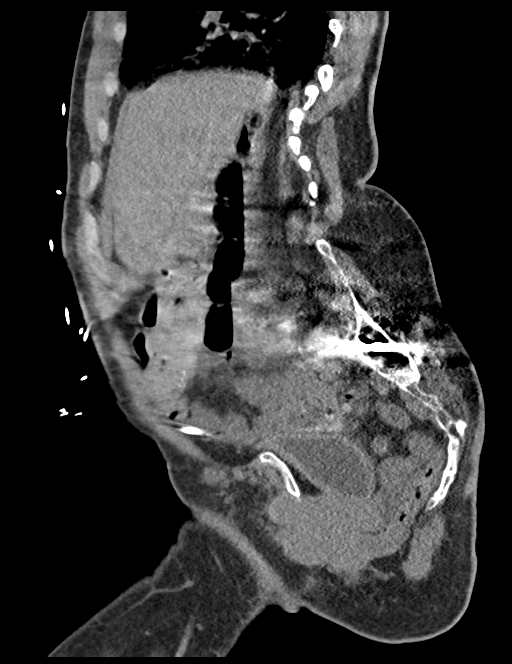
[im 37/110  bone]
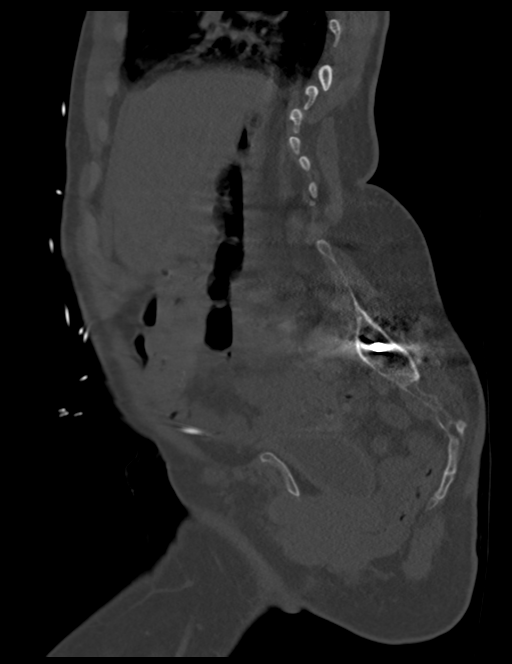

[7 of 46 positions shown; findings below may reference images not displayed]

FINDINGS: Lower chest: The visualized lung bases are clear. Mild cardiomegaly.

Hepatobiliary: No focal liver abnormality is seen. No gallstones,
gallbladder wall thickening, or biliary dilatation.

Pancreas: Unremarkable

Spleen: Unremarkable

Adrenals/Urinary Tract: The adrenal glands are unremarkable. The
kidneys are slightly obscured by streak artifact from extensive
spinal hardware, however, appear unremarkable. There is no
hydronephrosis. No intrarenal or ureteral calculi are 8 identified.
The bladder is unremarkable.

Stomach/Bowel: Ventriculoperitoneal shunt catheter tubing terminates
within the a right lower quadrant and no pericatheter loculated
fluid collection is identified. The stomach, small bowel, and large
bowel are unremarkable. The appendix is not clearly identified
though no secondary signs of appendicitis are seen within the right
lower quadrant.

Vascular/Lymphatic: The abdominal vasculature is partially obscured
by streak artifact from extensive spinal hardware. The visualized
suprarenal aorta and inferior vena cava and peripheral iliac
vasculature appears unremarkable on this noncontrast examination. No
pathologic adenopathy within the abdomen and pelvis.

Reproductive: Prostate is unremarkable.

Other: None

Musculoskeletal: Spina bifida is noted with marked thoracolumbar
scoliosis. Spinal fusion hardware is in place. The pelvis appears
dysmorphic. There is congenital dysplasia of the right hip.
IMPRESSION: No acute intra-abdominal pathology identified. No definite
radiographic explanation for the patient's reported symptoms.

Numerous congenital abnormalities as outlined above.

Mild cardiomegaly

## 2022-05-01 IMAGING — CT CT ABD-PELV W/ CM
2 of 4 series · 16 of 46 positions shown, 18 images · IV contrast (APPLIED)
Comparison: CT abdomen pelvis dated 05/27/2021.

CLINICAL DATA: Concern for bowel obstruction.

EXAM:
CT ABDOMEN AND PELVIS WITH CONTRAST
TECHNIQUE: Multidetector CT imaging of the abdomen and pelvis was performed
using the standard protocol following bolus administration of
intravenous contrast.
CONTRAST:  70mL OMNIPAQUE IOHEXOL 300 MG/ML  SOLN

[Series 2: abd pel w · axial · 0.72mm/px · z∈[+787,+1147]mm · 13 of 78 slices shown, 15 images]
[im 3/78  soft-tissue]
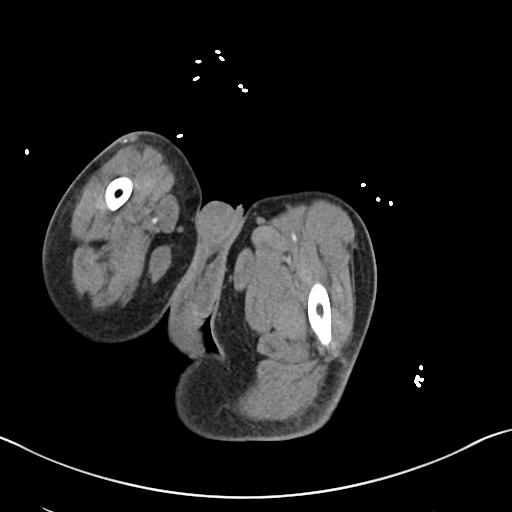
[im 3/78  bone]
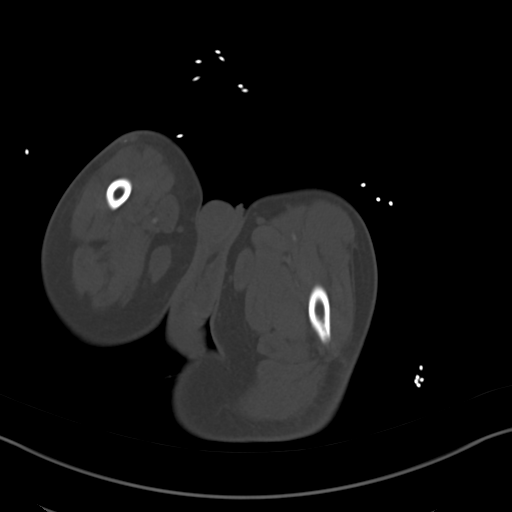
[im 9/78  soft-tissue]
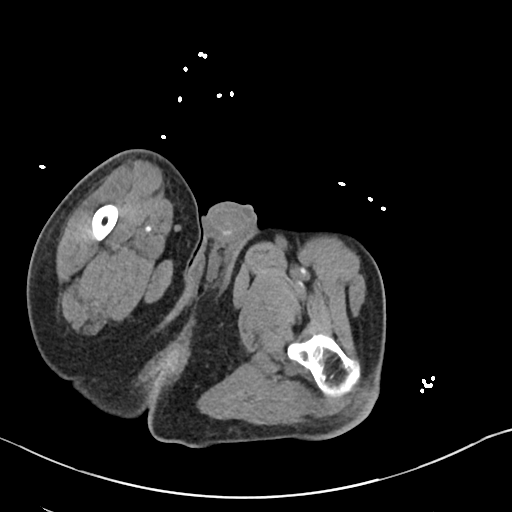
[im 15/78  soft-tissue]
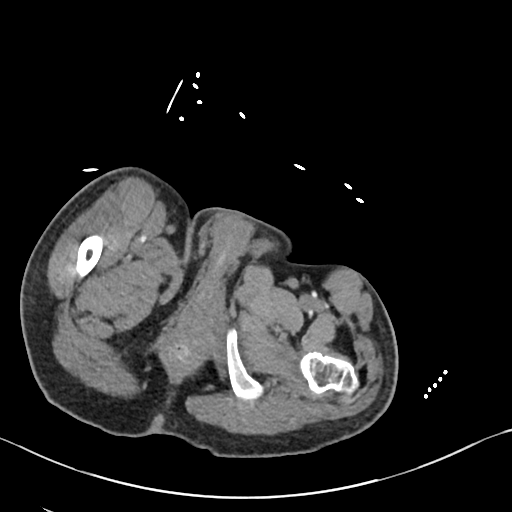
[im 21/78  soft-tissue]
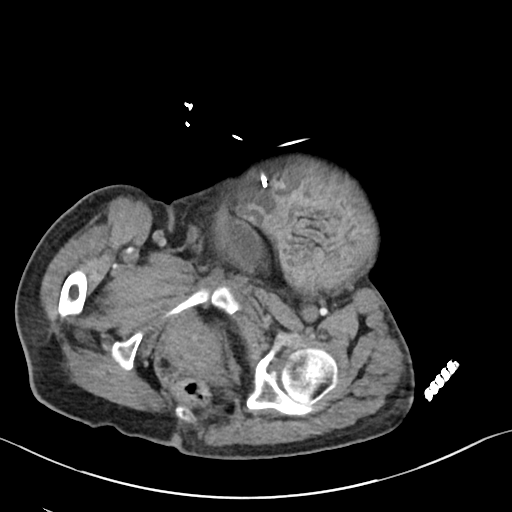
[im 27/78  soft-tissue]
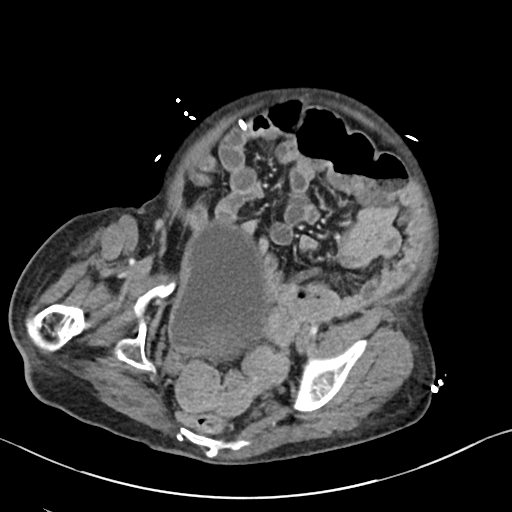
[im 33/78  soft-tissue]
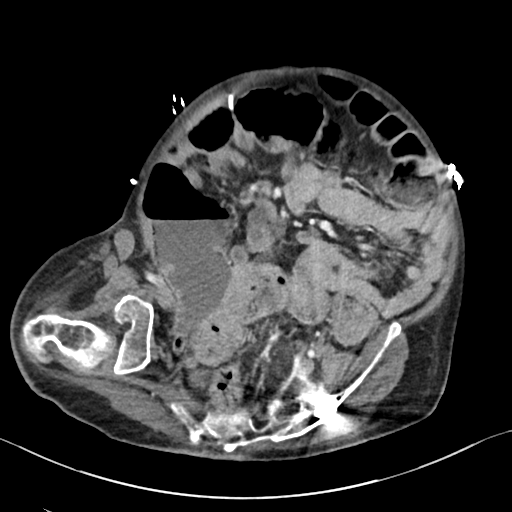
[im 39/78  soft-tissue]
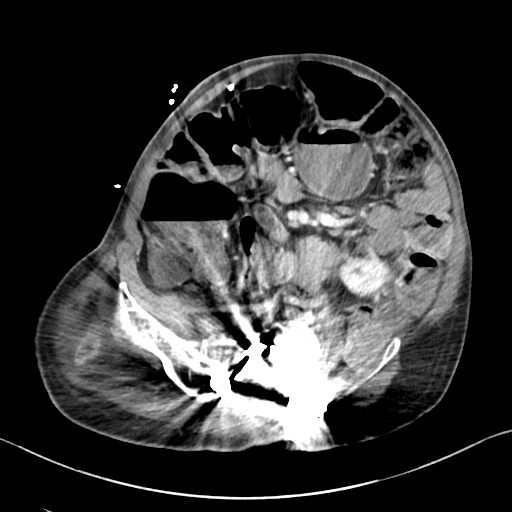
[im 45/78  soft-tissue]
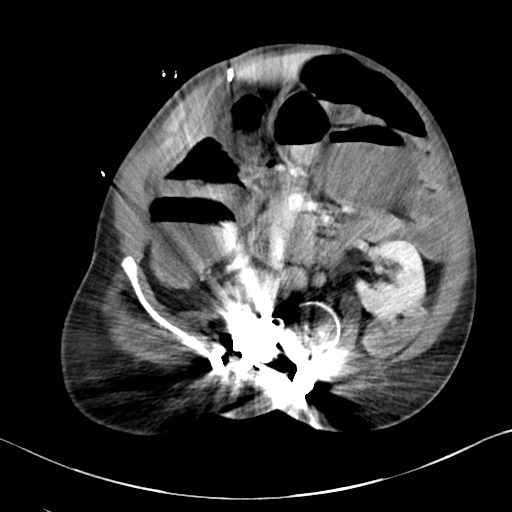
[im 51/78  soft-tissue]
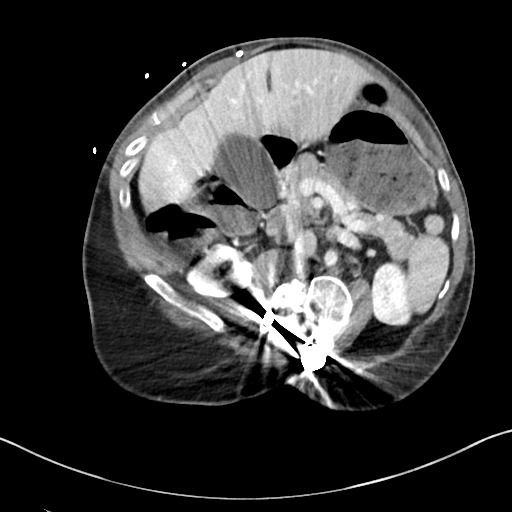
[im 51/78  bone]
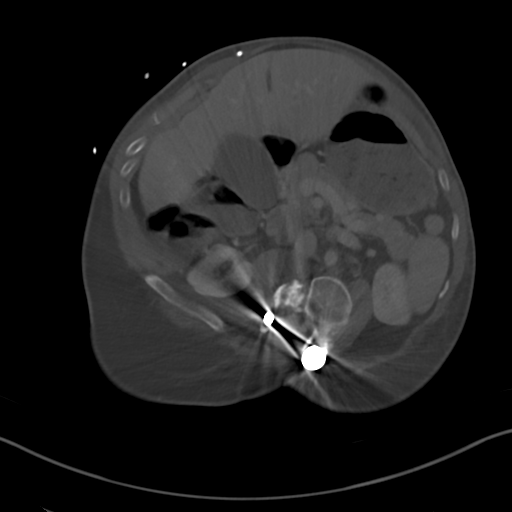
[im 57/78  soft-tissue]
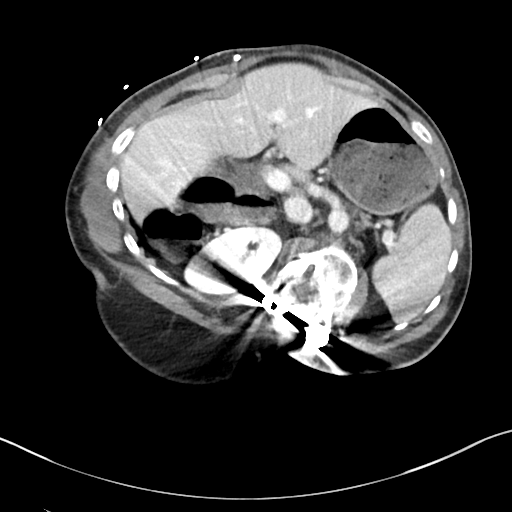
[im 63/78  soft-tissue]
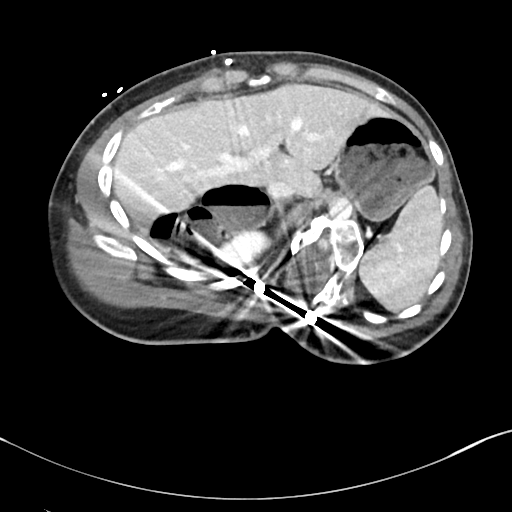
[im 69/78  soft-tissue]
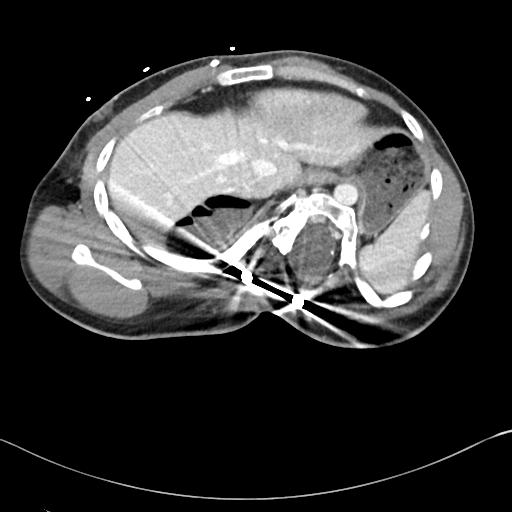
[im 75/78  soft-tissue]
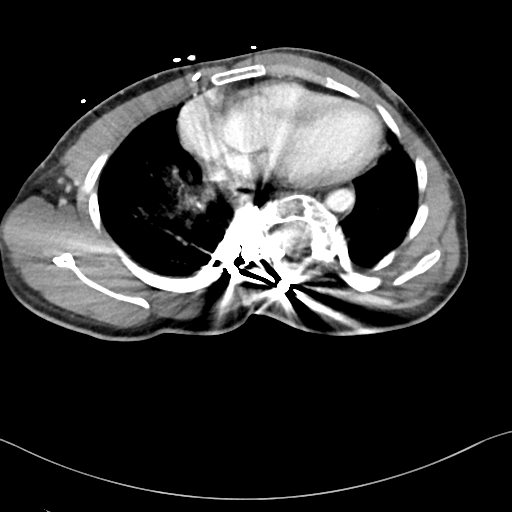

[Series 5: coronal · coronal · 0.75mm/px · 3 of 99 slices shown]
[im 33/99  soft-tissue]
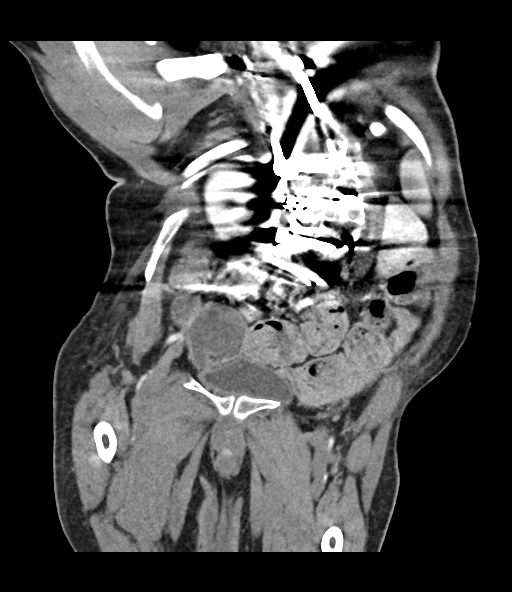
[im 44/99  soft-tissue]
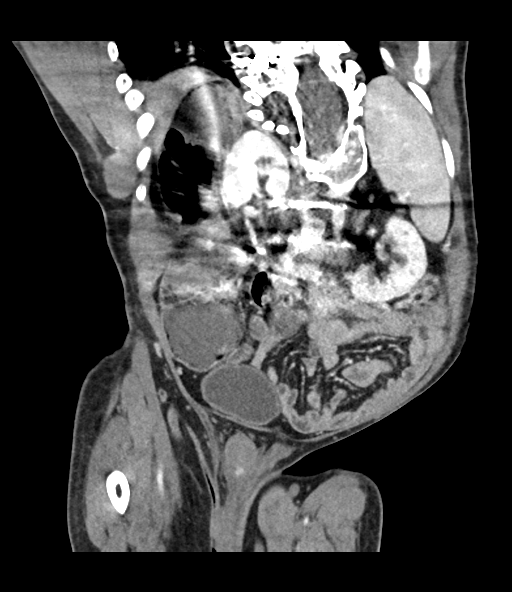
[im 55/99  soft-tissue]
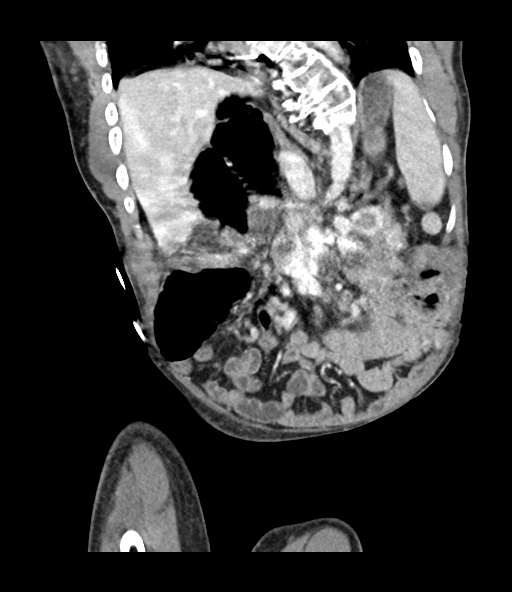

[16 of 46 positions shown; findings below may reference images not displayed]

FINDINGS: Evaluation is very limited due to scoliosis and streak artifact
caused by spinal hardware.

Lower chest: The visualized lung bases are clear. There is
cardiomegaly.

No intra-abdominal free air or free fluid.

Hepatobiliary: No focal liver abnormality is seen. No gallstones,
gallbladder wall thickening, or biliary dilatation.

Pancreas: Unremarkable. No pancreatic ductal dilatation or
surrounding inflammatory changes.

Spleen: Normal in size without focal abnormality.

Adrenals/Urinary Tract: The kidneys are unremarkable. The urinary
bladder is unremarkable.

Stomach/Bowel: The stomach is distended. There is no bowel
obstruction. The appendix is normal.

Vascular/Lymphatic: The abdominal aorta and IVC unremarkable. No
portal venous gas. There is no adenopathy.

Reproductive: The prostate and seminal vesicles are grossly
unremarkable.

Other: Partially visualized VP shunt with tip in the pelvis.

Musculoskeletal: Severe scoliosis and posterior fusion hardware.
There is non fusion of the posterior elements consistent with spina
bifida. No acute osseous pathology. Chronic subluxation of the right
hip.
IMPRESSION: No acute intra-abdominal or pelvic pathology. No bowel obstruction.
Normal appendix.

## 2022-09-20 ENCOUNTER — Inpatient Hospital Stay (HOSPITAL_BASED_OUTPATIENT_CLINIC_OR_DEPARTMENT_OTHER)
Admission: EM | Admit: 2022-09-20 | Discharge: 2022-09-26 | DRG: 871 | Disposition: A | Discharge status: Home / Self Care | Payer: Medicare Other | Attending: Internal Medicine | Admitting: Internal Medicine

## 2022-09-20 ENCOUNTER — Encounter (HOSPITAL_BASED_OUTPATIENT_CLINIC_OR_DEPARTMENT_OTHER): Payer: Self-pay

## 2022-09-20 ENCOUNTER — Emergency Department (HOSPITAL_BASED_OUTPATIENT_CLINIC_OR_DEPARTMENT_OTHER): Payer: Medicare Other

## 2022-09-20 ENCOUNTER — Other Ambulatory Visit: Payer: Self-pay

## 2022-09-20 DIAGNOSIS — N319 Neuromuscular dysfunction of bladder, unspecified: Secondary | ICD-10-CM | POA: Diagnosis present

## 2022-09-20 DIAGNOSIS — Z882 Allergy status to sulfonamides status: Secondary | ICD-10-CM

## 2022-09-20 DIAGNOSIS — J9601 Acute respiratory failure with hypoxia: Secondary | ICD-10-CM | POA: Diagnosis not present

## 2022-09-20 DIAGNOSIS — J189 Pneumonia, unspecified organism: Principal | ICD-10-CM | POA: Diagnosis present

## 2022-09-20 DIAGNOSIS — E8729 Other acidosis: Secondary | ICD-10-CM | POA: Diagnosis present

## 2022-09-20 DIAGNOSIS — K219 Gastro-esophageal reflux disease without esophagitis: Secondary | ICD-10-CM | POA: Diagnosis present

## 2022-09-20 DIAGNOSIS — A419 Sepsis, unspecified organism: Principal | ICD-10-CM | POA: Diagnosis present

## 2022-09-20 DIAGNOSIS — Z91040 Latex allergy status: Secondary | ICD-10-CM

## 2022-09-20 DIAGNOSIS — Q059 Spina bifida, unspecified: Secondary | ICD-10-CM

## 2022-09-20 DIAGNOSIS — R6520 Severe sepsis without septic shock: Secondary | ICD-10-CM | POA: Diagnosis present

## 2022-09-20 DIAGNOSIS — Z1152 Encounter for screening for COVID-19: Secondary | ICD-10-CM

## 2022-09-20 DIAGNOSIS — Z885 Allergy status to narcotic agent status: Secondary | ICD-10-CM

## 2022-09-20 DIAGNOSIS — J9602 Acute respiratory failure with hypercapnia: Secondary | ICD-10-CM | POA: Diagnosis present

## 2022-09-20 DIAGNOSIS — K5909 Other constipation: Secondary | ICD-10-CM | POA: Diagnosis present

## 2022-09-20 DIAGNOSIS — Z888 Allergy status to other drugs, medicaments and biological substances status: Secondary | ICD-10-CM

## 2022-09-20 LAB — COMPREHENSIVE METABOLIC PANEL
ALT: 13 U/L (ref 0–44)
AST: 21 U/L (ref 15–41)
Albumin: 4.8 g/dL (ref 3.5–5.0)
Alkaline Phosphatase: 60 U/L (ref 38–126)
Anion gap: 13 (ref 5–15)
BUN: 12 mg/dL (ref 6–20)
CO2: 27 mmol/L (ref 22–32)
Calcium: 9.9 mg/dL (ref 8.9–10.3)
Chloride: 100 mmol/L (ref 98–111)
Creatinine, Ser: 1.2 mg/dL (ref 0.61–1.24)
GFR, Estimated: >60 mL/min (ref >60)
Glucose, Bld: 215 mg/dL — ABNORMAL HIGH (ref 70–99)
Potassium: 4.2 mmol/L (ref 3.5–5.1)
Sodium: 140 mmol/L (ref 135–145)
Total Bilirubin: 0.3 mg/dL (ref 0.3–1.2)
Total Protein: 7.7 g/dL (ref 6.5–8.1)

## 2022-09-20 LAB — CBC WITH DIFFERENTIAL/PLATELET
Abs Immature Granulocytes: 0.07 10*3/uL (ref 0.00–0.07)
Basophils Absolute: 0.1 10*3/uL (ref 0.0–0.1)
Basophils Relative: 1 %
Eosinophils Absolute: 0.2 10*3/uL (ref 0.0–0.5)
Eosinophils Relative: 1 %
HCT: 53.9 % — ABNORMAL HIGH (ref 39.0–52.0)
Hemoglobin: 17.2 g/dL — ABNORMAL HIGH (ref 13.0–17.0)
Immature Granulocytes: 0 %
Lymphocytes Relative: 11 %
Lymphs Abs: 2.1 10*3/uL (ref 0.7–4.0)
MCH: 30.4 pg (ref 26.0–34.0)
MCHC: 31.9 g/dL (ref 30.0–36.0)
MCV: 95.4 fL (ref 80.0–100.0)
Monocytes Absolute: 1.4 10*3/uL — ABNORMAL HIGH (ref 0.1–1.0)
Monocytes Relative: 8 %
Neutro Abs: 15.3 10*3/uL — ABNORMAL HIGH (ref 1.7–7.7)
Neutrophils Relative %: 79 %
Platelets: 307 10*3/uL (ref 150–400)
RBC: 5.65 MIL/uL (ref 4.22–5.81)
RDW: 12.6 % (ref 11.5–15.5)
WBC: 19.2 10*3/uL — ABNORMAL HIGH (ref 4.0–10.5)
nRBC: 0 % (ref 0.0–0.2)

## 2022-09-20 LAB — I-STAT ARTERIAL BLOOD GAS, ED
Acid-base deficit: 1 mmol/L (ref 0.0–2.0)
Bicarbonate: 28.1 mmol/L — ABNORMAL HIGH (ref 20.0–28.0)
Calcium, Ion: 1.25 mmol/L (ref 1.15–1.40)
HCT: 50 % (ref 39.0–52.0)
Hemoglobin: 17 g/dL (ref 13.0–17.0)
O2 Saturation: 78 %
Patient temperature: 100.3
Potassium: 3.7 mmol/L (ref 3.5–5.1)
Sodium: 138 mmol/L (ref 135–145)
TCO2: 30 mmol/L (ref 22–32)
pCO2 arterial: 69.2 mmHg (ref 32–48)
pH, Arterial: 7.223 — ABNORMAL LOW (ref 7.35–7.45)
pO2, Arterial: 55 mmHg — ABNORMAL LOW (ref 83–108)

## 2022-09-20 LAB — LACTIC ACID, PLASMA: Lactic Acid, Venous: 3.5 mmol/L (ref 0.5–1.9)

## 2022-09-20 LAB — RESP PANEL BY RT-PCR (RSV, FLU A&B, COVID)  RVPGX2
Influenza A by PCR: NEGATIVE
Influenza B by PCR: NEGATIVE
Resp Syncytial Virus by PCR: NEGATIVE
SARS Coronavirus 2 by RT PCR: NEGATIVE

## 2022-09-20 MED ORDER — LACTATED RINGERS IV BOLUS: 500.0000 mL | Freq: Once | INTRAVENOUS | Status: DC

## 2022-09-20 MED ORDER — ALBUTEROL SULFATE (2.5 MG/3ML) 0.083% IN NEBU
2.5000 mg | INHALATION_SOLUTION | Freq: Once | RESPIRATORY_TRACT | Status: AC
  Administered 2022-09-20: 2.5 mg via RESPIRATORY_TRACT
  Filled 2022-09-20: qty 3

## 2022-09-20 MED ORDER — LACTATED RINGERS IV BOLUS
1000.0000 mL | Freq: Once | INTRAVENOUS | Status: AC
  Administered 2022-09-20: 1000 mL via INTRAVENOUS

## 2022-09-20 MED ORDER — LACTATED RINGERS IV SOLN: INTRAVENOUS | Status: AC

## 2022-09-20 MED ORDER — SODIUM CHLORIDE 0.9 % IV SOLN: INTRAVENOUS | Status: DC | PRN

## 2022-09-20 MED ORDER — SODIUM CHLORIDE 0.9 % IV SOLN
2.0000 g | Freq: Once | INTRAVENOUS | Status: AC
  Administered 2022-09-20: 2 g via INTRAVENOUS
  Filled 2022-09-20: qty 20

## 2022-09-20 MED ORDER — SODIUM CHLORIDE 0.9 % IV SOLN
500.0000 mg | Freq: Once | INTRAVENOUS | Status: AC
  Administered 2022-09-20: 500 mg via INTRAVENOUS
  Filled 2022-09-20: qty 5

## 2022-09-20 MED ORDER — IPRATROPIUM-ALBUTEROL 0.5-2.5 (3) MG/3ML IN SOLN
3.0000 mL | Freq: Once | RESPIRATORY_TRACT | Status: AC
  Administered 2022-09-20: 3 mL via RESPIRATORY_TRACT
  Filled 2022-09-20: qty 3

## 2022-09-20 MED ORDER — LACTATED RINGERS IV BOLUS (SEPSIS)
500.0000 mL | Freq: Once | INTRAVENOUS | Status: AC
  Administered 2022-09-20: 500 mL via INTRAVENOUS

## 2022-09-20 NOTE — ED Triage Notes (Signed)
Pt BIB d/t concerns for SOB ongoing for a few hours. No cough/fevers. No resp hx. Hx of spina bifida   RT evaluated pt in triage.

## 2022-09-20 NOTE — Progress Notes (Signed)
32 year old male with history of spina bifida, presented to [place] ER with complaint of shortness of breath and cough. On presentation patient hypoxic at 88%, temperature 100.3, RR in the 30s, BP normal range. CXR shows right basilar opacity suggesting atelectasis or infiltrate.  WBC 19.2, LA 3.5 ABG: 7.22/69.2/55/30  Patient placed on BiPAP, code sepsis called.  Patient given Rocephin and azithromycin.  And sepsis bolus.

## 2022-09-20 NOTE — ED Provider Notes (Signed)
[place] Provider Note   CSN: [id number] Arrival date & time: 09/20/22  1948     History  Chief Complaint  Patient presents with   Shortness of Breath    Samuel Becker is a 32 y.o. male.   Shortness of Breath Patient with history of spina bifida.  Self caths.  Last few hours has shortness of breath.  Is had cough.  Dyspneic and hypoxic upon arrival.  No known sick contacts.  Patient wants most of the history to come from his father who is with him.    Past Medical History:  Diagnosis Date   Spina bifida   History reviewed. No pertinent surgical history.   Home Medications Prior to Admission medications   Not on File      Allergies    Latex and Septra [sulfamethoxazole-trimethoprim]    Review of Systems   Review of Systems  Respiratory:  Positive for shortness of breath.     Physical Exam Updated Vital Signs BP 130/85   Pulse (!) 123   Temp 100.3 F (37.9 C) (Rectal)   Resp (!) 35   SpO2 100%  Physical Exam Vitals and nursing note reviewed.  HENT:     Head: Normocephalic.  Cardiovascular:     Rate and Rhythm: Regular rhythm. Tachycardia present.  Pulmonary:     Comments: Diffuse harsh breath sounds with tachypnea. Abdominal:     Tenderness: There is no abdominal tenderness.  Musculoskeletal:     Cervical back: Neck supple.     Comments: History of spina bifida with underdeveloped lower extremities.  Skin:    General: Skin is warm.  Neurological:     Mental Status: He is alert.     ED Results / Procedures / Treatments   Labs (all labs ordered are listed, but only abnormal results are displayed) Labs Reviewed  COMPREHENSIVE METABOLIC PANEL - Abnormal; Notable for the following components:      Result Value   Glucose, Bld 215 (*)    All other components within normal limits  LACTIC ACID, PLASMA - Abnormal; Notable for the following components:   Lactic Acid, Venous 3.5 (*)    All other  components within normal limits  CBC WITH DIFFERENTIAL/PLATELET - Abnormal; Notable for the following components:   WBC 19.2 (*)    Hemoglobin 17.2 (*)    HCT 53.9 (*)    Neutro Abs 15.3 (*)    Monocytes Absolute 1.4 (*)    All other components within normal limits  I-STAT ARTERIAL BLOOD GAS, ED - Abnormal; Notable for the following components:   pH, Arterial 7.223 (*)    pCO2 arterial 69.2 (*)    pO2, Arterial 55 (*)    Bicarbonate 28.1 (*)    All other components within normal limits  RESP PANEL BY RT-PCR (RSV, FLU A&B, COVID)  RVPGX2  CULTURE, BLOOD (ROUTINE X 2)  CULTURE, BLOOD (ROUTINE X 2)  LACTIC ACID, PLASMA    EKG EKG Interpretation  Date/Time:  Wednesday September 20 2022 20:17:26 EDT Ventricular Rate:  156 PR Interval:  131 QRS Duration: 73 QT Interval:  255 QTC Calculation: 411 R Axis:   150 Text Interpretation: Sinus tachycardia LAE, consider biatrial enlargement Probable RVH w/ secondary repol abnormality Nonspecific T abnormalities, lateral leads Artifact in lead(s) II III aVF V3 V4 V5 V6 Confirmed by Davonna Belling [phone]) on 09/20/2022 10:48:55 PM  Radiology DG Chest Portable 1 View  Result Date: 09/20/2022 CLINICAL DATA:  Shortness  of breath EXAM: PORTABLE CHEST 1 VIEW COMPARISON:  Chest radiograph dated December 13, 2021. FINDINGS: The heart is normal in size. Severe levoscoliosis with Harrington rods, unchanged. Right basilar opacity suggesting atelectasis or infiltrate. 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 IMPRESSION: Right basilar opacity suggesting atelectasis or infiltrate. Follow-up examination to resolution is recommended. Advanced scoliosis with thoracic spine hardware, unchanged. Electronically Signed   By: Keane Police D.O.   On: 09/20/2022 21:20    Procedures Procedures    Medications Ordered in ED Medications  0.9 %  sodium chloride infusion ( Intravenous New Bag/Given 09/20/22 2044)  lactated ringers bolus 500 mL (has no  administration in time range)  lactated ringers infusion (has no administration in time range)  lactated ringers bolus 500 mL (500 mLs Intravenous New Bag/Given 09/20/22 2259)  albuterol (PROVENTIL) (2.5 MG/3ML) 0.083% nebulizer solution 2.5 mg (2.5 mg Nebulization Given 09/20/22 2010)  ipratropium-albuterol (DUONEB) 0.5-2.5 (3) MG/3ML nebulizer solution 3 mL (3 mLs Nebulization Given 09/20/22 2009)  cefTRIAXone (ROCEPHIN) 2 g in sodium chloride 0.9 % 100 mL IVPB (0 g Intravenous Stopped 09/20/22 2117)  azithromycin (ZITHROMAX) 500 mg in sodium chloride 0.9 % 250 mL IVPB (0 mg Intravenous Stopped 09/20/22 2300)  lactated ringers bolus 1,000 mL (1,000 mLs Intravenous New Bag/Given 09/20/22 2126)    ED Course/ Medical Decision Making/ A&P                             Medical Decision Making Amount and/or Complexity of Data Reviewed Labs: ordered. Radiology: ordered.  Risk Prescription drug management. Decision regarding hospitalization.   Patient with shortness of breath and cough.  Temperature is 100.3 rectally.  Tachycardic and tachypneic.  Initially hypoxic.  Differential diagnosis includes shortness of breath, pneumonia, pneumothorax.  Started empirically on pneumonia antibiotics.  Chest x-ray did have little delay getting done.  However when it was done did showed pneumonia.  Lactic acid mildly elevated at 3.5.  Will give fluid bolus due to suspicion of sepsis due to tachycardia elevated white count and elevated lactic acid.  ABG done and showed hypoxia and hypercapnia.  Started on BiPAP after the ABG.   CRITICAL CARE Performed by: Davonna Belling Total critical care time: 30 minutes Critical care time was exclusive of separately billable procedures and treating other patients. Critical care was necessary to treat or prevent imminent or life-threatening deterioration. Critical care was time spent personally by me on the following activities: development of treatment plan with patient and/or  surrogate as well as nursing, discussions with consultants, evaluation of patient's response to treatment, examination of patient, obtaining history from patient or surrogate, ordering and performing treatments and interventions, ordering and review of laboratory studies, ordering and review of radiographic studies, pulse oximetry and re-evaluation of patient's condition.  Patient has had fluid bolus and heart rate has come down.  Now about 115.  Mother states heart rate normally runs about 110.  Clinically appears better and feeling better.  For now will plan on potential stepdown admission but will continue to monitor and if significantly improves will discuss with hospitalist for change of bed.        Final Clinical Impression(s) / ED Diagnoses Final diagnoses:  Community acquired pneumonia of right lung, unspecified part of lung    Rx / DC Orders ED Discharge Orders     None         Davonna Belling, MD 09/20/22 2322

## 2022-09-20 NOTE — ED Notes (Signed)
Report given to Caryl Pina, RN at [facility] cone ICU, carelink here to take pt to [facility] via stretcher.

## 2022-09-20 NOTE — Sepsis Progress Note (Signed)
Elink monitoring for the code sepsis protocol.  

## 2022-09-21 DIAGNOSIS — Z1152 Encounter for screening for COVID-19: Secondary | ICD-10-CM | POA: Diagnosis not present

## 2022-09-21 DIAGNOSIS — J9602 Acute respiratory failure with hypercapnia: Secondary | ICD-10-CM | POA: Diagnosis present

## 2022-09-21 DIAGNOSIS — J189 Pneumonia, unspecified organism: Secondary | ICD-10-CM | POA: Diagnosis present

## 2022-09-21 DIAGNOSIS — Z888 Allergy status to other drugs, medicaments and biological substances status: Secondary | ICD-10-CM | POA: Diagnosis not present

## 2022-09-21 DIAGNOSIS — Z885 Allergy status to narcotic agent status: Secondary | ICD-10-CM | POA: Diagnosis not present

## 2022-09-21 DIAGNOSIS — K219 Gastro-esophageal reflux disease without esophagitis: Secondary | ICD-10-CM | POA: Diagnosis present

## 2022-09-21 DIAGNOSIS — A419 Sepsis, unspecified organism: Secondary | ICD-10-CM | POA: Diagnosis present

## 2022-09-21 DIAGNOSIS — Z882 Allergy status to sulfonamides status: Secondary | ICD-10-CM | POA: Diagnosis not present

## 2022-09-21 DIAGNOSIS — E8729 Other acidosis: Secondary | ICD-10-CM | POA: Diagnosis present

## 2022-09-21 DIAGNOSIS — N319 Neuromuscular dysfunction of bladder, unspecified: Secondary | ICD-10-CM | POA: Diagnosis present

## 2022-09-21 DIAGNOSIS — Z91040 Latex allergy status: Secondary | ICD-10-CM | POA: Diagnosis not present

## 2022-09-21 DIAGNOSIS — J9601 Acute respiratory failure with hypoxia: Secondary | ICD-10-CM

## 2022-09-21 DIAGNOSIS — K5909 Other constipation: Secondary | ICD-10-CM | POA: Diagnosis present

## 2022-09-21 DIAGNOSIS — Q059 Spina bifida, unspecified: Secondary | ICD-10-CM | POA: Diagnosis not present

## 2022-09-21 DIAGNOSIS — R6520 Severe sepsis without septic shock: Secondary | ICD-10-CM | POA: Diagnosis present

## 2022-09-21 LAB — COMPREHENSIVE METABOLIC PANEL
ALT: 12 U/L (ref 0–44)
AST: 13 U/L — ABNORMAL LOW (ref 15–41)
Albumin: 3.5 g/dL (ref 3.5–5.0)
Alkaline Phosphatase: 37 U/L — ABNORMAL LOW (ref 38–126)
Anion gap: 8 (ref 5–15)
BUN: 12 mg/dL (ref 6–20)
CO2: 27 mmol/L (ref 22–32)
Calcium: 8.2 mg/dL — ABNORMAL LOW (ref 8.9–10.3)
Chloride: 104 mmol/L (ref 98–111)
Creatinine, Ser: 0.76 mg/dL (ref 0.61–1.24)
GFR, Estimated: >60 mL/min (ref >60)
Glucose, Bld: 105 mg/dL — ABNORMAL HIGH (ref 70–99)
Potassium: 3.9 mmol/L (ref 3.5–5.1)
Sodium: 139 mmol/L (ref 135–145)
Total Bilirubin: 0.4 mg/dL (ref 0.3–1.2)
Total Protein: 5.7 g/dL — ABNORMAL LOW (ref 6.5–8.1)

## 2022-09-21 LAB — CBC WITH DIFFERENTIAL/PLATELET
Abs Immature Granulocytes: 0.07 10*3/uL (ref 0.00–0.07)
Basophils Absolute: 0.1 10*3/uL (ref 0.0–0.1)
Basophils Relative: 0 %
Eosinophils Absolute: 0.1 10*3/uL (ref 0.0–0.5)
Eosinophils Relative: 1 %
HCT: 42.6 % (ref 39.0–52.0)
Hemoglobin: 13.7 g/dL (ref 13.0–17.0)
Immature Granulocytes: 1 %
Lymphocytes Relative: 12 %
Lymphs Abs: 1.5 10*3/uL (ref 0.7–4.0)
MCH: 30.6 pg (ref 26.0–34.0)
MCHC: 32.2 g/dL (ref 30.0–36.0)
MCV: 95.3 fL (ref 80.0–100.0)
Monocytes Absolute: 1.5 10*3/uL — ABNORMAL HIGH (ref 0.1–1.0)
Monocytes Relative: 11 %
Neutro Abs: 10.1 10*3/uL — ABNORMAL HIGH (ref 1.7–7.7)
Neutrophils Relative %: 75 %
Platelets: 164 10*3/uL (ref 150–400)
RBC: 4.47 MIL/uL (ref 4.22–5.81)
RDW: 12.6 % (ref 11.5–15.5)
WBC: 13.3 10*3/uL — ABNORMAL HIGH (ref 4.0–10.5)
nRBC: 0 % (ref 0.0–0.2)

## 2022-09-21 LAB — BLOOD GAS, VENOUS
Acid-Base Excess: 4 mmol/L — ABNORMAL HIGH (ref 0.0–2.0)
Bicarbonate: 31.6 mmol/L — ABNORMAL HIGH (ref 20.0–28.0)
Drawn by: 35529
O2 Saturation: 49.8 %
Patient temperature: 36.7
pCO2, Ven: 59 mmHg (ref 44–60)
pH, Ven: 7.33 (ref 7.25–7.43)
pO2, Ven: <31 mmHg — CL (ref 32–45)

## 2022-09-21 LAB — STREP PNEUMONIAE URINARY ANTIGEN: Strep Pneumo Urinary Antigen: NEGATIVE

## 2022-09-21 LAB — PROCALCITONIN: Procalcitonin: 0.13 ng/mL

## 2022-09-21 LAB — MRSA NEXT GEN BY PCR, NASAL: MRSA by PCR Next Gen: NOT DETECTED

## 2022-09-21 LAB — URINALYSIS, W/ REFLEX TO CULTURE (INFECTION SUSPECTED)
Bilirubin Urine: NEGATIVE
Glucose, UA: NEGATIVE mg/dL
Hgb urine dipstick: NEGATIVE
Ketones, ur: 5 mg/dL — AB
Nitrite: POSITIVE — AB
Protein, ur: 30 mg/dL — AB
Specific Gravity, Urine: 1.023 (ref 1.005–1.030)
WBC, UA: >50 WBC/hpf (ref 0–5)
pH: 5 (ref 5.0–8.0)

## 2022-09-21 LAB — LACTIC ACID, PLASMA
Lactic Acid, Venous: 1.2 mmol/L (ref 0.5–1.9)
Lactic Acid, Venous: 1.2 mmol/L (ref 0.5–1.9)

## 2022-09-21 LAB — HIV ANTIBODY (ROUTINE TESTING W REFLEX): HIV Screen 4th Generation wRfx: NONREACTIVE

## 2022-09-21 LAB — MAGNESIUM: Magnesium: 1.6 mg/dL — ABNORMAL LOW (ref 1.7–2.4)

## 2022-09-21 LAB — PHOSPHORUS: Phosphorus: 4.3 mg/dL (ref 2.5–4.6)

## 2022-09-21 MED ORDER — ALBUTEROL SULFATE (2.5 MG/3ML) 0.083% IN NEBU: 2.5000 mg | INHALATION_SOLUTION | Freq: Four times a day (QID) | RESPIRATORY_TRACT | Status: DC

## 2022-09-21 MED ORDER — SODIUM CHLORIDE 0.9 % IV SOLN
2.0000 g | INTRAVENOUS | Status: DC
  Administered 2022-09-21 – 2022-09-25 (×5): 2 g via INTRAVENOUS
  Filled 2022-09-21 (×5): qty 20

## 2022-09-21 MED ORDER — IPRATROPIUM-ALBUTEROL 0.5-2.5 (3) MG/3ML IN SOLN
3.0000 mL | Freq: Four times a day (QID) | RESPIRATORY_TRACT | Status: DC
  Administered 2022-09-21 – 2022-09-26 (×22): 3 mL via RESPIRATORY_TRACT
  Filled 2022-09-21 (×21): qty 3

## 2022-09-21 MED ORDER — ENOXAPARIN SODIUM 40 MG/0.4ML IJ SOSY
40.0000 mg | PREFILLED_SYRINGE | INTRAMUSCULAR | Status: DC
  Administered 2022-09-21 – 2022-09-26 (×6): 40 mg via SUBCUTANEOUS
  Filled 2022-09-21 (×6): qty 0.4

## 2022-09-21 MED ORDER — SODIUM CHLORIDE 0.9 % IV SOLN
500.0000 mg | INTRAVENOUS | Status: DC
  Administered 2022-09-21 – 2022-09-22 (×2): 500 mg via INTRAVENOUS
  Filled 2022-09-21 (×2): qty 5

## 2022-09-21 MED ORDER — POLYETHYLENE GLYCOL 3350 17 G PO PACK
17.0000 g | PACK | Freq: Every day | ORAL | Status: DC | PRN
  Administered 2022-09-25: 17 g via ORAL
  Filled 2022-09-21: qty 1

## 2022-09-21 MED ORDER — PANTOPRAZOLE SODIUM 40 MG PO TBEC
40.0000 mg | DELAYED_RELEASE_TABLET | Freq: Every day | ORAL | Status: DC
  Administered 2022-09-21 – 2022-09-22 (×2): 40 mg via ORAL
  Filled 2022-09-21 (×2): qty 1

## 2022-09-21 MED ORDER — ALBUTEROL SULFATE (2.5 MG/3ML) 0.083% IN NEBU
2.5000 mg | INHALATION_SOLUTION | Freq: Four times a day (QID) | RESPIRATORY_TRACT | Status: DC | PRN
  Administered 2022-09-22 – 2022-09-23 (×2): 2.5 mg via RESPIRATORY_TRACT
  Filled 2022-09-21 (×2): qty 3

## 2022-09-21 MED ORDER — CHLORHEXIDINE GLUCONATE CLOTH 2 % EX PADS
6.0000 | MEDICATED_PAD | Freq: Every day | CUTANEOUS | Status: DC
  Administered 2022-09-21 – 2022-09-23 (×3): 6 via TOPICAL

## 2022-09-21 MED ORDER — IPRATROPIUM-ALBUTEROL 0.5-2.5 (3) MG/3ML IN SOLN: 3.0000 mL | Freq: Four times a day (QID) | RESPIRATORY_TRACT | Status: DC

## 2022-09-21 MED ORDER — ORAL CARE MOUTH RINSE: 15.0000 mL | OROMUCOSAL | Status: DC | PRN

## 2022-09-21 MED ORDER — ACETAMINOPHEN 325 MG PO TABS
650.0000 mg | ORAL_TABLET | Freq: Four times a day (QID) | ORAL | Status: DC | PRN
  Administered 2022-09-22: 650 mg via ORAL
  Filled 2022-09-21: qty 2

## 2022-09-21 MED ORDER — GUAIFENESIN ER 600 MG PO TB12
600.0000 mg | ORAL_TABLET | Freq: Two times a day (BID) | ORAL | Status: AC
  Administered 2022-09-21 – 2022-09-23 (×6): 600 mg via ORAL
  Filled 2022-09-21 (×6): qty 1

## 2022-09-21 MED ORDER — PROCHLORPERAZINE EDISYLATE 10 MG/2ML IJ SOLN: 5.0000 mg | Freq: Four times a day (QID) | INTRAMUSCULAR | Status: DC | PRN

## 2022-09-21 MED ORDER — MAGNESIUM SULFATE 2 GM/50ML IV SOLN
2.0000 g | Freq: Once | INTRAVENOUS | Status: AC
  Administered 2022-09-21: 2 g via INTRAVENOUS
  Filled 2022-09-21: qty 50

## 2022-09-21 MED ORDER — ORAL CARE MOUTH RINSE
15.0000 mL | OROMUCOSAL | Status: DC
  Administered 2022-09-21 – 2022-09-22 (×6): 15 mL via OROMUCOSAL

## 2022-09-21 MED ORDER — MELATONIN 5 MG PO TABS: 5.0000 mg | ORAL_TABLET | Freq: Every evening | ORAL | Status: DC | PRN

## 2022-09-21 MED ORDER — OXYBUTYNIN CHLORIDE ER 5 MG PO TB24
15.0000 mg | ORAL_TABLET | Freq: Every day | ORAL | Status: DC
  Administered 2022-09-21 – 2022-09-25 (×5): 15 mg via ORAL
  Filled 2022-09-21 (×5): qty 3

## 2022-09-21 NOTE — Evaluation (Signed)
Clinical/Bedside Swallow Evaluation Patient Details  Name: MARCOANTONIO GESSLER MRN: [id number] Date of Birth: 20-Jan-1991  Today's Date: 09/21/2022 Time: SLP Start Time (ACUTE ONLY): 0945 SLP Stop Time (ACUTE ONLY): 1038 SLP Time Calculation (min) (ACUTE ONLY): 53 min  Past Medical History:  Past Medical History:  Diagnosis Date   Spina bifida    Past Surgical History: History reviewed. No pertinent surgical history. HPI:  32 year old male with history of spina bifida with thoracic scoliosis admitted to [facility] from [place] with pneumonia.   Chest x-ray showed right lower lobe atelectasis versus pneumonia.  Patient has past medical history including constipation, neurogenic bladder requiring self cath, seasonal allergies, ADD, hydrocephalus status post VP shunt placement, GERD.  He required BiPAP overnight and is now gone from 6 L nasal cannula weaned to 4 L.  Swallow evaluation ordered.  Patient denies significant issues with dysphagia prior to admission.    Assessment / Plan / Recommendation  Clinical Impression  Patient presents with functional oropharyngeal swallow based on clinical evaluation.  Swallow appeared timely without evidence of retention across all boluses.  He is noted to have slight increase in dyspnea with intake, respiratory rate increasing to low 20s.  SLP observed patient self-feeding graham crackers entire container of applesauce and at least 6 ounces of water.  Slight audible "squeak" sound noted after consumption of water x 2 evaluation.  Patient reports this has been ongoing for the next few weeks but denies issues with reflux unless he consumes foods that cause symptoms.  He endorses odynophagia for few days prior to admission, does have history of allergies -this may indicate issues with reflux or exacerbation of allergies prior to admission.  Patient denies having any reflux or regurgitation send denies prior to admission.  Recommend regular thin diet with  strict precautions.  Spoke to patient and his mother at length about findings and recommendations including his impaired phonation and cough strength at this time increasing his aspiration pneumonia risk. If patient aspirated SLP suspects more related to PES and/or esophageal issues given his thoracic scoliosis spina bifada.  Will follow up briefly to ensure p.o. tolerance.  Given this is patient's first pneumonia, anticipate instrumental swallowing evaluation may not be needed at this point.  Given history of patient's reflux and current medical issue potentially exacerbating GERD, recommend consider short-term PPI if MD agrees. SLP Visit Diagnosis: Dysphagia, unspecified (R13.10)    Aspiration Risk  Mild aspiration risk    Diet Recommendation Regular;Thin liquid   Liquid Administration via: Straw Medication Administration: Other (Comment) (As tolerated, start with liquids) Supervision: Patient able to self feed Compensations: Slow rate;Small sips/bites;Other (Comment) (Try to consume small frequent meals, start intake with liquid) Postural Changes: Seated upright at 90 degrees;Remain upright for at least 30 minutes after po intake    Other  Recommendations Oral Care Recommendations: Oral care BID    Recommendations for follow up therapy are one component of a multi-disciplinary discharge planning process, led by the attending physician.  Recommendations may be updated based on patient status, additional functional criteria and insurance authorization.  Follow up Recommendations        Assistance Recommended at Discharge    Functional Status Assessment Patient has had a recent decline in their functional status and demonstrates the ability to make significant improvements in function in a reasonable and predictable amount of time.  Frequency and Duration min 1 x/week  1 week       Prognosis Prognosis for improved oropharyngeal function: Good  Swallow Study   General Date of  Onset: 09/21/22 HPI: 32 year old male with history of spina bifida with thoracic scoliosis admitted to [facility] from [place] with pneumonia.   Chest x-ray showed right lower lobe atelectasis versus pneumonia.  Patient has past medical history including constipation, neurogenic bladder requiring self cath, seasonal allergies, ADD, hydrocephalus status post VP shunt placement, GERD.  He required BiPAP overnight and is now gone from 6 L nasal cannula weaned to 4 L.  Swallow evaluation ordered.  Patient denies significant issues with dysphagia prior to admission. Type of Study: Bedside Swallow Evaluation Diet Prior to this Study: NPO Temperature Spikes Noted: No Respiratory Status: Nasal cannula (4) History of Recent Intubation: No Behavior/Cognition: Alert;Cooperative;Pleasant mood Oral Cavity Assessment: Within Functional Limits Oral Care Completed by SLP: No Oral Cavity - Dentition: Adequate natural dentition Vision: Functional for self-feeding Self-Feeding Abilities: Able to feed self Patient Positioning: Upright in bed Baseline Vocal Quality: Low vocal intensity;Other (comment) (Patient only able to verbalize 1-2 breathy words, reports this is not baseline.) Volitional Cough: Weak    Oral/Motor/Sensory Function Overall Oral Motor/Sensory Function: Within functional limits   Ice Chips Ice chips: Within functional limits Presentation: Spoon   Thin Liquid Thin Liquid: Impaired Presentation: Self Fed;Straw Pharyngeal  Phase Impairments: Other (comments);Change in Vital Signs Other Comments: Patient noted to demonstrate inhalation post swallow, 3 ounce Yale water challenge administered with patient passing noted potential PES dysfunction characterized by subtle squeak noted.    Nectar Thick Nectar Thick Liquid: Not tested   Honey Thick Honey Thick Liquid: Not tested   Puree Puree: Within functional limits Presentation: Self Fed;Spoon   Solid     Solid: Within functional  limits Presentation: Self Fredirick Lathe 09/21/2022,11:31 AM Kathleen Lime, MS [place] Office [phone]

## 2022-09-21 NOTE — Progress Notes (Signed)
Breakdown noted over nose. Mepilex applied prior to BiPAP placement.

## 2022-09-21 NOTE — Progress Notes (Signed)
Patient admitted after midnight, please see H&P.  Initially on Bipap but has been weaned to [facility].  Continue to monitor in the SDU.  PPI ordered for acid reflux. Eulogio Bear DO

## 2022-09-21 NOTE — H&P (Addendum)
History and Physical  Samuel Becker [id number] DOB: Mar 23, 1991 DOA: 09/20/2022  Referring physician: Accepted by Dr. Claria Dice [facility], hospitalist service. PCP: Katherina Mires, MD  Outpatient Specialists: Neurology, urology. Patient coming from: Home through [facility] ED  Chief Complaint: Shortness of breath.  HPI: Samuel Becker is a 32 y.o. male with medical history significant for spina bifida, neurogenic bladder, who initially presented to droppage ED with complaints of sudden onset shortness of breath that started on the day of presentation.  Denies aspirating.  Associated with a dry cough.  In the ED, workup was negative for COVID-19, influenza AMB.  Chest x-ray showed right lower lobe infiltrates consistent with pneumonia.  Lab studies were remarkable for leukocytosis.  Vital signs were notable for elevated temperature 100.3, tachycardia 107, tachypnea 24.  An ABG showed acute respiratory acidosis.  The patient was placed on BiPAP.  The patient was admitted for sepsis secondary to community-acquired pneumonia.  Accepted by Dr. Claria Dice and transferred to [facility] stepdown unit as inpatient status.  ED Course: Tmax 100.3.  BP 105/87, pulse 100, respiratory rate 30, saturation 99% on BiPAP.  Lab studies remarkable for ABG 7.223/69.2/55.  WBC 19.2, hemoglobin 17.2.  Lactic acid 3.5, repeat 1.2, repeat 1.2.  Magnesium 1.6.  Review of Systems: Review of systems as noted in the HPI. All other systems reviewed and are negative.   Past Medical History:  Diagnosis Date   Spina bifida    History reviewed. No pertinent surgical history.  Social History:  reports that he has never smoked. He has never used smokeless tobacco. No history on file for alcohol use and drug use.   Allergies  Allergen Reactions   Latex    Septra [Sulfamethoxazole-Trimethoprim]     Family history: None reported.  Home medications: Oxybutynin  Physical Exam: BP 130/85   Pulse (!)  118   Temp 100.3 F (37.9 C) (Rectal)   Resp (!) 31   SpO2 100%   General: 32 y.o. year-old male well developed well nourished in no acute distress.  Alert and oriented x3. Cardiovascular: Regular rate and rhythm with no rubs or gallops.  No thyromegaly or JVD noted.  No lower extremity edema. 2/4 pulses in all 4 extremities. Respiratory: Diffuse rales bilaterally.  No wheezing noted. Good inspiratory effort. Abdomen: Soft nontender nondistended with normal bowel sounds x4 quadrants. Muskuloskeletal: No cyanosis, clubbing or edema noted bilaterally Neuro: CN II-XII intact, strength, sensation, reflexes Skin: No ulcerative lesions noted or rashes Psychiatry: Judgement and insight appear normal. Mood is appropriate for condition and setting          Labs on Admission:  Basic Metabolic Panel: Recent Labs  Lab 09/20/22 2012 09/20/22 2031  NA 140 138  K 4.2 3.7  CL 100  --   CO2 27  --   GLUCOSE 215*  --   BUN 12  --   CREATININE 1.20  --   CALCIUM 9.9  --    Liver Function Tests: Recent Labs  Lab 09/20/22 2012  AST 21  ALT 13  ALKPHOS 60  BILITOT 0.3  PROT 7.7  ALBUMIN 4.8   No results for input(s): "LIPASE", "AMYLASE" in the last 168 hours. No results for input(s): "AMMONIA" in the last 168 hours. CBC: Recent Labs  Lab 09/20/22 2012 09/20/22 2031  WBC 19.2*  --   NEUTROABS 15.3*  --   HGB 17.2* 17.0  HCT 53.9* 50.0  MCV 95.4  --   PLT 307  --  Cardiac Enzymes: No results for input(s): "CKTOTAL", "CKMB", "CKMBINDEX", "TROPONINI" in the last 168 hours.  BNP (last 3 results) No results for input(s): "BNP" in the last 8760 hours.  ProBNP (last 3 results) No results for input(s): "PROBNP" in the last 8760 hours.  CBG: No results for input(s): "GLUCAP" in the last 168 hours.  Radiological Exams on Admission: DG Chest Portable 1 View  Result Date: 09/20/2022 CLINICAL DATA:  Shortness of breath EXAM: PORTABLE CHEST 1 VIEW COMPARISON:  Chest radiograph  dated December 13, 2021. FINDINGS: The heart is normal in size. Severe levoscoliosis with Harrington rods, unchanged. Right basilar opacity suggesting atelectasis or infiltrate. 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 IMPRESSION: Right basilar opacity suggesting atelectasis or infiltrate. Follow-up examination to resolution is recommended. Advanced scoliosis with thoracic spine hardware, unchanged. Electronically Signed   By: Keane Police D.O.   On: 09/20/2022 21:20    EKG: I independently viewed the EKG done and my findings are as followed: Sinus tachycardia rate of 156.  Nonspecific ST-T changes.  QTc 411.  Assessment/Plan Present on Admission:  Acute respiratory failure with hypoxia  Principal Problem:   Acute respiratory failure with hypoxia  Acute hypoxic and hypercarbic respiratory failure secondary to right lower lobe community-acquired pneumonia Not on oxygen supplementation at baseline Was placed on BiPAP on presentation ABG with findings as stated above Repeat VBG with improvement of pCO2 and pH. pCO2 still slightly elevated Continue BiPAP nightly and wean off as tolerated Obtain MRSA screening test if positive add IV vancomycin  Sepsis secondary to right lower lobe community-acquired pneumonia Rule out aspiration Speech therapist evaluation Aspiration precautions Started on Rocephin and IV azithromycin in the ED, continue Obtain baseline procalcitonin Urine antigen strep pneumonia, Legionella pneumo urine antigen Follow-up peripheral blood cultures x 2 and urine culture Incentive spirometer Bronchodilators  Maintain MAP greater than 65.  Hypomagnesemia Magnesium 1.6 Repleted intravenously Recheck and replete electrolytes as indicated.  Spina bifida Appears stable  Neurogenic bladder Resume home oxybutynin Self caths  Chronic constipation Water enemas    DVT prophylaxis: Subcu Lovenox daily  Code Status: Full code  Family Communication:  Updated his father and stepmother at bedside  Disposition Plan: Admitted to stepdown unit  Consults called: None  Admission status: Inpatient status.   Status is: Inpatient The patient requires at least 2 midnights for further evaluation and treatment of present condition.   Kayleen Memos MD [facility] Pager [phone]  If 7PM-7AM, please contact night-coverage [email or web address] Password [email or web address]  09/21/2022, 12:20 AM

## 2022-09-21 NOTE — Progress Notes (Signed)
Patient admitted into room 1226.

## 2022-09-21 NOTE — Progress Notes (Signed)
  Transition of Care [facility]) Screening Note   Patient Details  Name: ALONZA ERRICO Date of Birth: [birth year]   Transition of Care [facility]) CM/SW Contact:    Roseanne Kaufman, RN Phone Number: 09/21/2022, 3:23 PM  Per chart review patient resides with parents, PMH spina bifida and self caths at home Q4 hours while awake.  Transition of Care Department [facility]) has reviewed patient and no TOC needs have been identified at this time. We will continue to monitor patient advancement through interdisciplinary progression rounds. If new patient transition needs arise, please place a TOC consult.

## 2022-09-21 NOTE — Sepsis Progress Note (Signed)
Notified bedside nurse of need to draw repeat lactic acid.  Transferred to the floor without repeat lactic being collected.

## 2022-09-21 NOTE — Progress Notes (Signed)
Patient self caths at home q 4 during awake hours. Family stated they brought his supplies. Explained to family members at bedside that patient would need to use hospital supplied cath kit and have service provided by staff during stay due to increased risk of catheter associated infection. Family and patient verbalized understanding

## 2022-09-22 DIAGNOSIS — J9601 Acute respiratory failure with hypoxia: Secondary | ICD-10-CM | POA: Diagnosis not present

## 2022-09-22 LAB — BASIC METABOLIC PANEL
Anion gap: 6 (ref 5–15)
BUN: 10 mg/dL (ref 6–20)
CO2: 29 mmol/L (ref 22–32)
Calcium: 8.4 mg/dL — ABNORMAL LOW (ref 8.9–10.3)
Chloride: 104 mmol/L (ref 98–111)
Creatinine, Ser: 0.67 mg/dL (ref 0.61–1.24)
GFR, Estimated: >60 mL/min (ref >60)
Glucose, Bld: 97 mg/dL (ref 70–99)
Potassium: 4.4 mmol/L (ref 3.5–5.1)
Sodium: 139 mmol/L (ref 135–145)

## 2022-09-22 LAB — CBC
HCT: 42 % (ref 39.0–52.0)
Hemoglobin: 13.1 g/dL (ref 13.0–17.0)
MCH: 30.5 pg (ref 26.0–34.0)
MCHC: 31.2 g/dL (ref 30.0–36.0)
MCV: 97.7 fL (ref 80.0–100.0)
Platelets: 146 10*3/uL — ABNORMAL LOW (ref 150–400)
RBC: 4.3 MIL/uL (ref 4.22–5.81)
RDW: 12.7 % (ref 11.5–15.5)
WBC: 8.6 10*3/uL (ref 4.0–10.5)
nRBC: 0 % (ref 0.0–0.2)

## 2022-09-22 LAB — URINE CULTURE: Culture: NO GROWTH

## 2022-09-22 LAB — MAGNESIUM: Magnesium: 2.3 mg/dL (ref 1.7–2.4)

## 2022-09-22 MED ORDER — POLYVINYL ALCOHOL 1.4 % OP SOLN
2.0000 [drp] | OPHTHALMIC | Status: DC | PRN
  Filled 2022-09-22 (×2): qty 15

## 2022-09-22 MED ORDER — LIP MEDEX EX OINT
TOPICAL_OINTMENT | CUTANEOUS | Status: DC | PRN
  Filled 2022-09-22 (×2): qty 7

## 2022-09-22 NOTE — Progress Notes (Signed)
Patient with desaturation episode post-cpap. Respiratory notified, duoneb given early.

## 2022-09-22 NOTE — Plan of Care (Signed)

## 2022-09-22 NOTE — Progress Notes (Signed)
PROGRESS NOTE    Samuel KindlerBenjamin C Becker  QMV:[id number] DOB: 06/08/1991 DOA: 09/20/2022 PCP: Moshe CiproMatthews, Stephanie, NP    Brief Narrative:  Samuel Becker is a 32 y.o. male with medical history significant for spina bifida, neurogenic bladder, who initially presented to [facility] with complaints of sudden onset shortness of breath that started on the day of presentation.  Denies aspirating.  Associated with a dry cough.   In the ED, workup was negative for COVID-19, influenza AMB.  Chest x-ray showed right lower lobe infiltrates consistent with pneumonia.  Lab studies were remarkable for leukocytosis.  Vital signs were notable for elevated temperature 100.3, tachycardia 107, tachypnea 24.  An ABG showed acute respiratory acidosis.  The patient was placed on BiPAP.  The patient was admitted for sepsis secondary to community-acquired pneumonia.    Assessment and Plan: Acute hypoxic and hypercarbic respiratory failure secondary to right lower lobe community-acquired pneumonia Not on oxygen supplementation at baseline Was placed on BiPAP on presentation- wean to [place] and wean to off   Sepsis secondary to right lower lobe community-acquired pneumonia -SLP-- treat acid reflux, regular diet Aspiration precautions Started on Rocephin and IV azithromycin in the ED, continue Urine antigen strep pneumonia negative -pulmonary toilet  Hypomagnesemia -replete   Spina bifida Appears stable -high risk for recurrent PNA   Neurogenic bladder Resume home oxybutynin Self caths   Chronic constipation Water enemas (use soap suds while here)   DVT prophylaxis: enoxaparin (LOVENOX) injection 40 mg Start: 09/21/22 1000    Code Status: Full Code Family Communication: at bedside  Disposition Plan:  Level of care: Stepdown Status is: Inpatient Remains inpatient appropriate because: needs IV abx and weaned off O2    Consultants:  none   Subjective: Off bipap-- has episode of low pulse off  when came off  Objective: Vitals:   09/22/22 0300 09/22/22 0401 09/22/22 0500 09/22/22 0700  BP:  (!) 143/103 130/79 (!) 162/100  Pulse:  90 100 94  Resp: (!) 27 15 18 20   Temp:  98.4 F (36.9 C)    TempSrc:  Axillary    SpO2:  97% 96% 100%  Weight:      Height:        Intake/Output Summary (Last 24 hours) at 09/22/2022 0804 Last data filed at 09/22/2022 0634 Gross per 24 hour  Intake 450.95 ml  Output 2300 ml  Net -1849.05 ml   Filed Weights   09/21/22 0841  Weight: 42.3 kg    Examination:   General: Appearance:    Thin male in no acute distress     Lungs:     On [place], diminished, respirations unlabored  Heart:    Normal heart rate.   MS:   All extremities are intact.    Neurologic:   Awake, alert       Data Reviewed: I have personally reviewed following labs and imaging studies  CBC: Recent Labs  Lab 09/20/22 2012 09/20/22 2031 09/21/22 0310 09/22/22 0231  WBC 19.2*  --  13.3* 8.6  NEUTROABS 15.3*  --  10.1*  --   HGB 17.2* 17.0 13.7 13.1  HCT 53.9* 50.0 42.6 42.0  MCV 95.4  --  95.3 97.7  PLT 307  --  164 146*   Basic Metabolic Panel: Recent Labs  Lab 09/20/22 2012 09/20/22 2031 09/21/22 0310 09/22/22 0231  NA 140 138 139 139  K 4.2 3.7 3.9 4.4  CL 100  --  104 104  CO2 27  --  27 29  GLUCOSE 215*  --  105* 97  BUN 12  --  12 10  CREATININE 1.20  --  0.76 0.67  CALCIUM 9.9  --  8.2* 8.4*  MG  --   --  1.6*  --   PHOS  --   --  4.3  --    GFR: Estimated Creatinine Clearance: 79.3 mL/min (by C-G formula based on SCr of 0.67 mg/dL). Liver Function Tests: Recent Labs  Lab 09/20/22 2012 09/21/22 0310  AST 21 13*  ALT 13 12  ALKPHOS 60 37*  BILITOT 0.3 0.4  PROT 7.7 5.7*  ALBUMIN 4.8 3.5   No results for input(s): "LIPASE", "AMYLASE" in the last 168 hours. No results for input(s): "AMMONIA" in the last 168 hours. Coagulation Profile: No results for input(s): "INR", "PROTIME" in the last 168 hours. Cardiac Enzymes: No results for  input(s): "CKTOTAL", "CKMB", "CKMBINDEX", "TROPONINI" in the last 168 hours. BNP (last 3 results) No results for input(s): "PROBNP" in the last 8760 hours. HbA1C: No results for input(s): "HGBA1C" in the last 72 hours. CBG: No results for input(s): "GLUCAP" in the last 168 hours. Lipid Profile: No results for input(s): "CHOL", "HDL", "LDLCALC", "TRIG", "CHOLHDL", "LDLDIRECT" in the last 72 hours. Thyroid Function Tests: No results for input(s): "TSH", "T4TOTAL", "FREET4", "T3FREE", "THYROIDAB" in the last 72 hours. Anemia Panel: No results for input(s): "VITAMINB12", "FOLATE", "FERRITIN", "TIBC", "IRON", "RETICCTPCT" in the last 72 hours. Sepsis Labs: Recent Labs  Lab 09/20/22 2030 09/21/22 0123 09/21/22 0310 09/21/22 0503  PROCALCITON  --   --   --  0.13  LATICACIDVEN 3.5* 1.2 1.2  --     Recent Results (from the past 240 hour(s))  Resp panel by RT-PCR (RSV, Flu A&B, Covid) Anterior Nasal Swab     Status: None   Collection Time: 09/20/22  8:12 PM   Specimen: Anterior Nasal Swab  Result Value Ref Range Status   SARS Coronavirus 2 by RT PCR NEGATIVE NEGATIVE Final    Comment: (NOTE) SARS-CoV-2 target nucleic acids are NOT DETECTED.  The SARS-CoV-2 RNA is generally detectable in upper respiratory specimens during the acute phase of infection. The lowest concentration of SARS-CoV-2 viral copies this assay can detect is 138 copies/mL. A negative result does not preclude SARS-Cov-2 infection and should not be used as the sole basis for treatment or other patient management decisions. A negative result may occur with  improper specimen collection/handling, submission of specimen other than nasopharyngeal swab, presence of viral mutation(s) within the areas targeted by this assay, and inadequate number of viral copies(<138 copies/mL). A negative result must be combined with clinical observations, patient history, and epidemiological information. The expected result is  Negative.  Fact Sheet for Patients:  BloggerCourse.com  Fact Sheet for Healthcare Providers:  SeriousBroker.it  This test is no t yet approved or cleared by the [place] FDA and  has been authorized for detection and/or diagnosis of SARS-CoV-2 by FDA under an Emergency Use Authorization (EUA). This EUA will remain  in effect (meaning this test can be used) for the duration of the COVID-19 declaration under Section 564(b)(1) of the Act, 21 U.S.C.section 360bbb-3(b)(1), unless the authorization is terminated  or revoked sooner.       Influenza A by PCR NEGATIVE NEGATIVE Final   Influenza B by PCR NEGATIVE NEGATIVE Final    Comment: (NOTE) The Xpert Xpress SARS-CoV-2/FLU/RSV plus assay is intended as an aid in the diagnosis of influenza from Nasopharyngeal swab specimens and should  not be used as a sole basis for treatment. Nasal washings and aspirates are unacceptable for Xpert Xpress SARS-CoV-2/FLU/RSV testing.  Fact Sheet for Patients: BloggerCourse.comhttps://www.fda.gov/media/152166/download  Fact Sheet for Healthcare Providers: SeriousBroker.ithttps://www.fda.gov/media/152162/download  This test is not yet approved or cleared by the [place] FDA and has been authorized for detection and/or diagnosis of SARS-CoV-2 by FDA under an Emergency Use Authorization (EUA). This EUA will remain in effect (meaning this test can be used) for the duration of the COVID-19 declaration under Section 564(b)(1) of the Act, 21 U.S.C. section 360bbb-3(b)(1), unless the authorization is terminated or revoked.     Resp Syncytial Virus by PCR NEGATIVE NEGATIVE Final    Comment: (NOTE) Fact Sheet for Patients: BloggerCourse.comhttps://www.fda.gov/media/152166/download  Fact Sheet for Healthcare Providers: SeriousBroker.ithttps://www.fda.gov/media/152162/download  This test is not yet approved or cleared by the [place] FDA and has been authorized for detection and/or diagnosis of  SARS-CoV-2 by FDA under an Emergency Use Authorization (EUA). This EUA will remain in effect (meaning this test can be used) for the duration of the COVID-19 declaration under Section 564(b)(1) of the Act, 21 U.S.C. section 360bbb-3(b)(1), unless the authorization is terminated or revoked.  Performed at [facility], [address], [place], [place] [address]   MRSA Next Gen by PCR, Nasal     Status: None   Collection Time: 09/21/22 12:06 AM   Specimen: Nasal Mucosa; Nasal Swab  Result Value Ref Range Status   MRSA by PCR Next Gen NOT DETECTED NOT DETECTED Final    Comment: (NOTE) The GeneXpert MRSA Assay (FDA approved for NASAL specimens only), is one component of a comprehensive MRSA colonization surveillance program. It is not intended to diagnose MRSA infection nor to guide or monitor treatment for MRSA infections. Test performance is not FDA approved in patients less than 33 years old. Performed at [facility], [address] [place], [place] [address]   Urine Culture     Status: None   Collection Time: 09/21/22  6:30 AM   Specimen: Urine, Random  Result Value Ref Range Status   Specimen Description   Final    URINE, RANDOM Performed at [facility], [address] [place], [place] [address]    Special Requests   Final    NONE Reflexed from [id number] Performed at [facility], [address] [place], [place] [address]    Culture   Final    NO GROWTH Performed at [facility], [address] [place], [place] [address]    Report Status 09/22/2022 FINAL  Final         Radiology Studies: DG Chest Portable 1 View  Result Date: 09/20/2022 CLINICAL DATA:  Shortness of breath EXAM: PORTABLE CHEST 1 VIEW COMPARISON:  Chest radiograph dated December 13, 2021. FINDINGS: The heart is normal in size. Severe levoscoliosis with Harrington rods, unchanged. Right basilar opacity suggesting atelectasis  or infiltrate. 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 IMPRESSION: Right basilar opacity suggesting atelectasis or infiltrate. Follow-up examination to resolution is recommended. Advanced scoliosis with thoracic spine hardware, unchanged. Electronically Signed   By: Larose HiresImran  Ahmed D.O.   On: 09/20/2022 21:20        Scheduled Meds:  Chlorhexidine Gluconate Cloth  6 each Topical Daily   enoxaparin (LOVENOX) injection  40 mg Subcutaneous Q24H   guaiFENesin  600 mg Oral BID   ipratropium-albuterol  3 mL Nebulization QID   mouth rinse  15 mL Mouth  Rinse 4 times per day   oxybutynin  15 mg Oral QHS   pantoprazole  40 mg Oral Daily   Continuous Infusions:  sodium chloride 10 mL/hr at 09/22/22 0634   azithromycin Stopped (09/21/22 2057)   cefTRIAXone (ROCEPHIN)  IV Stopped (09/21/22 2028)     LOS: 1 day    Time spent: 45 minutes spent on chart review, discussion with nursing staff, consultants, updating family and interview/physical exam; more than 50% of that time was spent in counseling and/or coordination of care.    Joseph Art, DO [facility] Available via Epic secure chat 7am-7pm After these hours, please refer to coverage provider listed on [email or web address] 09/22/2022, 8:04 AM

## 2022-09-22 NOTE — Progress Notes (Signed)
SLP Cancellation Note  Patient Details Name: Samuel Becker MRN: [id number] DOB: [birth year]   Cancelled treatment:       Reason Eval/Treat Not Completed: Other (comment) (pt being transferred to floor when SLP attempted visit, will continue efforts; RN, Enid Derry, reports pt tolerating po well and is taking small sips) Rolena Infante, MS [facility] SLP Acute Rehab Services Office [phone]   Chales Abrahams 09/22/2022, 5:18 PM

## 2022-09-23 DIAGNOSIS — J9601 Acute respiratory failure with hypoxia: Secondary | ICD-10-CM | POA: Diagnosis not present

## 2022-09-23 LAB — BASIC METABOLIC PANEL
Anion gap: 7 (ref 5–15)
BUN: 7 mg/dL (ref 6–20)
CO2: 26 mmol/L (ref 22–32)
Calcium: 8.3 mg/dL — ABNORMAL LOW (ref 8.9–10.3)
Chloride: 103 mmol/L (ref 98–111)
Creatinine, Ser: 0.59 mg/dL — ABNORMAL LOW (ref 0.61–1.24)
GFR, Estimated: >60 mL/min (ref >60)
Glucose, Bld: 94 mg/dL (ref 70–99)
Potassium: 4.2 mmol/L (ref 3.5–5.1)
Sodium: 136 mmol/L (ref 135–145)

## 2022-09-23 LAB — CBC
HCT: 40.9 % (ref 39.0–52.0)
Hemoglobin: 12.7 g/dL — ABNORMAL LOW (ref 13.0–17.0)
MCH: 30.3 pg (ref 26.0–34.0)
MCHC: 31.1 g/dL (ref 30.0–36.0)
MCV: 97.6 fL (ref 80.0–100.0)
Platelets: 161 10*3/uL (ref 150–400)
RBC: 4.19 MIL/uL — ABNORMAL LOW (ref 4.22–5.81)
RDW: 12.6 % (ref 11.5–15.5)
WBC: 7.6 10*3/uL (ref 4.0–10.5)
nRBC: 0 % (ref 0.0–0.2)

## 2022-09-23 MED ORDER — KETOTIFEN FUMARATE 0.035 % OP SOLN
1.0000 [drp] | Freq: Two times a day (BID) | OPHTHALMIC | Status: DC
  Administered 2022-09-23 – 2022-09-26 (×6): 1 [drp] via OPHTHALMIC
  Filled 2022-09-23: qty 5

## 2022-09-23 MED ORDER — AZITHROMYCIN 250 MG PO TABS
500.0000 mg | ORAL_TABLET | Freq: Every day | ORAL | Status: DC
  Administered 2022-09-23 – 2022-09-25 (×3): 500 mg via ORAL
  Filled 2022-09-23 (×3): qty 2

## 2022-09-23 MED ORDER — SIMETHICONE 80 MG PO CHEW
80.0000 mg | CHEWABLE_TABLET | Freq: Four times a day (QID) | ORAL | Status: DC | PRN
  Administered 2022-09-25 (×2): 80 mg via ORAL
  Filled 2022-09-23 (×2): qty 1

## 2022-09-23 MED ORDER — PANTOPRAZOLE SODIUM 40 MG PO TBEC
40.0000 mg | DELAYED_RELEASE_TABLET | Freq: Two times a day (BID) | ORAL | Status: DC
  Administered 2022-09-23 – 2022-09-26 (×6): 40 mg via ORAL
  Filled 2022-09-23 (×6): qty 1

## 2022-09-23 MED ORDER — PREDNISONE 20 MG PO TABS
40.0000 mg | ORAL_TABLET | Freq: Every day | ORAL | Status: DC
  Administered 2022-09-23 – 2022-09-26 (×4): 40 mg via ORAL
  Filled 2022-09-23 (×4): qty 2

## 2022-09-23 NOTE — Progress Notes (Signed)
PROGRESS NOTE    [name]  IBB:[id number] DOB: [birth year] DOA: 09/20/2022 PCP: Moshe Cipro, NP    Brief Narrative:  Samuel Becker is a 32 y.o. male with medical history significant for spina bifida, neurogenic bladder, who initially presented to [facility] with complaints of sudden onset shortness of breath that started on the day of presentation.  Denies aspirating.  Associated with a dry cough.   In the ED, workup was negative for COVID-19, influenza AMB.  Chest x-ray showed right lower lobe infiltrates consistent with pneumonia.  Lab studies were remarkable for leukocytosis.  Vital signs were notable for elevated temperature 100.3, tachycardia 107, tachypnea 24.  An ABG showed acute respiratory acidosis.  The patient was placed on BiPAP.  The patient was admitted for sepsis secondary to community-acquired pneumonia.    Assessment and Plan: Acute hypoxic and hypercarbic respiratory failure secondary to right lower lobe community-acquired pneumonia Not on oxygen supplementation at baseline Was placed on BiPAP on presentation- wean to [place] and wean to off -still wheezing, add steroids  GERD -IV protonix   Sepsis secondary to right lower lobe community-acquired pneumonia -SLP-- treat acid reflux, regular diet Aspiration precautions Started on Rocephin and IV azithromycin in the ED, continue Urine antigen strep pneumonia negative -pulmonary toilet  Hypomagnesemia -replete   Spina bifida Appears stable -high risk for recurrent PNA   Neurogenic bladder Resume home oxybutynin Self caths   Chronic constipation Water enemas (use soap suds while here)   DVT prophylaxis: enoxaparin (LOVENOX) injection 40 mg Start: 09/21/22 1000    Code Status: Full Code Family Communication: at bedside  Disposition Plan:  Level of care: Progressive Status is: Inpatient Remains inpatient appropriate because: needs IV abx and weaned off O2    Consultants:   none   Subjective: Off bipap-- has episode of low pulse off when came off  Objective: Vitals:   09/22/22 2125 09/23/22 0027 09/23/22 0454 09/23/22 0906  BP:  (!) 143/100 122/87 119/89  Pulse:  (!) 102 89 100  Resp: 16 20 19    Temp:  99 F (37.2 C) 98.6 F (37 C) 98.5 F (36.9 C)  TempSrc:  Oral Oral Oral  SpO2:  98% 99% 98%  Weight:      Height:        Intake/Output Summary (Last 24 hours) at 09/23/2022 1056 Last data filed at 09/23/2022 0535 Gross per 24 hour  Intake 507.64 ml  Output 1775 ml  Net -1267.36 ml   Filed Weights   09/21/22 0841  Weight: 42.3 kg    Examination:    General: Appearance:    Thin male in no acute distress, still with wet sounding voice  Skin:   Lungs:     respirations unlabored  Heart:    Tachycardic. .   MS:   All extremities are intact.   Neurologic:   Awake, alert, oriented x 3. No apparent focal neurological           defect.          Data Reviewed: I have personally reviewed following labs and imaging studies  CBC: Recent Labs  Lab 09/20/22 2012 09/20/22 2031 09/21/22 0310 09/22/22 0231 09/23/22 0617  WBC 19.2*  --  13.3* 8.6 7.6  NEUTROABS 15.3*  --  10.1*  --   --   HGB 17.2* 17.0 13.7 13.1 12.7*  HCT 53.9* 50.0 42.6 42.0 40.9  MCV 95.4  --  95.3 97.7 97.6  PLT 307  --  164  146* 161   Basic Metabolic Panel: Recent Labs  Lab 09/20/22 2012 09/20/22 2031 09/21/22 0310 09/22/22 0226 09/22/22 0231 09/23/22 0617  NA 140 138 139  --  139 136  K 4.2 3.7 3.9  --  4.4 4.2  CL 100  --  104  --  104 103  CO2 27  --  27  --  29 26  GLUCOSE 215*  --  105*  --  97 94  BUN 12  --  12  --  10 7  CREATININE 1.20  --  0.76  --  0.67 0.59*  CALCIUM 9.9  --  8.2*  --  8.4* 8.3*  MG  --   --  1.6* 2.3  --   --   PHOS  --   --  4.3  --   --   --    GFR: Estimated Creatinine Clearance: 79.3 mL/min (A) (by C-G formula based on SCr of 0.59 mg/dL (L)). Liver Function Tests: Recent Labs  Lab 09/20/22 2012 09/21/22 0310   AST 21 13*  ALT 13 12  ALKPHOS 60 37*  BILITOT 0.3 0.4  PROT 7.7 5.7*  ALBUMIN 4.8 3.5   No results for input(s): "LIPASE", "AMYLASE" in the last 168 hours. No results for input(s): "AMMONIA" in the last 168 hours. Coagulation Profile: No results for input(s): "INR", "PROTIME" in the last 168 hours. Cardiac Enzymes: No results for input(s): "CKTOTAL", "CKMB", "CKMBINDEX", "TROPONINI" in the last 168 hours. BNP (last 3 results) No results for input(s): "PROBNP" in the last 8760 hours. HbA1C: No results for input(s): "HGBA1C" in the last 72 hours. CBG: No results for input(s): "GLUCAP" in the last 168 hours. Lipid Profile: No results for input(s): "CHOL", "HDL", "LDLCALC", "TRIG", "CHOLHDL", "LDLDIRECT" in the last 72 hours. Thyroid Function Tests: No results for input(s): "TSH", "T4TOTAL", "FREET4", "T3FREE", "THYROIDAB" in the last 72 hours. Anemia Panel: No results for input(s): "VITAMINB12", "FOLATE", "FERRITIN", "TIBC", "IRON", "RETICCTPCT" in the last 72 hours. Sepsis Labs: Recent Labs  Lab 09/20/22 2030 09/21/22 0123 09/21/22 0310 09/21/22 0503  PROCALCITON  --   --   --  0.13  LATICACIDVEN 3.5* 1.2 1.2  --     Recent Results (from the past 240 hour(s))  Culture, blood (routine x 2)     Status: None (Preliminary result)   Collection Time: 09/20/22  8:05 PM   Specimen: BLOOD  Result Value Ref Range Status   Specimen Description   Final    BLOOD LEFT ANTECUBITAL Performed at [facility], [address] [place], [place] [address]    Special Requests   Final    Blood Culture adequate volume BOTTLES DRAWN AEROBIC AND ANAEROBIC Performed at [facility], [address] [place], [place] [address]    Culture   Final    NO GROWTH 2 DAYS Performed at [facility], [address], [place], [place] [address]    Report Status PENDING  Incomplete  Resp panel by RT-PCR (RSV, Flu A&B, Covid) Anterior Nasal Swab     Status:  None   Collection Time: 09/20/22  8:12 PM   Specimen: Anterior Nasal Swab  Result Value Ref Range Status   SARS Coronavirus 2 by RT PCR NEGATIVE NEGATIVE Final    Comment: (NOTE) SARS-CoV-2 target nucleic acids are NOT DETECTED.  The SARS-CoV-2 RNA is generally detectable in upper respiratory specimens during the acute phase of infection. The lowest concentration of SARS-CoV-2 viral copies this assay can detect  is 138 copies/mL. A negative result does not preclude SARS-Cov-2 infection and should not be used as the sole basis for treatment or other patient management decisions. A negative result may occur with  improper specimen collection/handling, submission of specimen other than nasopharyngeal swab, presence of viral mutation(s) within the areas targeted by this assay, and inadequate number of viral copies(<138 copies/mL). A negative result must be combined with clinical observations, patient history, and epidemiological information. The expected result is Negative.  Fact Sheet for Patients:  BloggerCourse.com  Fact Sheet for Healthcare Providers:  SeriousBroker.it  This test is no t yet approved or cleared by the [place] FDA and  has been authorized for detection and/or diagnosis of SARS-CoV-2 by FDA under an Emergency Use Authorization (EUA). This EUA will remain  in effect (meaning this test can be used) for the duration of the COVID-19 declaration under Section 564(b)(1) of the Act, 21 U.S.C.section 360bbb-3(b)(1), unless the authorization is terminated  or revoked sooner.       Influenza A by PCR NEGATIVE NEGATIVE Final   Influenza B by PCR NEGATIVE NEGATIVE Final    Comment: (NOTE) The Xpert Xpress SARS-CoV-2/FLU/RSV plus assay is intended as an aid in the diagnosis of influenza from Nasopharyngeal swab specimens and should not be used as a sole basis for treatment. Nasal washings and aspirates are unacceptable  for Xpert Xpress SARS-CoV-2/FLU/RSV testing.  Fact Sheet for Patients: BloggerCourse.com  Fact Sheet for Healthcare Providers: SeriousBroker.it  This test is not yet approved or cleared by the [place] FDA and has been authorized for detection and/or diagnosis of SARS-CoV-2 by FDA under an Emergency Use Authorization (EUA). This EUA will remain in effect (meaning this test can be used) for the duration of the COVID-19 declaration under Section 564(b)(1) of the Act, 21 U.S.C. section 360bbb-3(b)(1), unless the authorization is terminated or revoked.     Resp Syncytial Virus by PCR NEGATIVE NEGATIVE Final    Comment: (NOTE) Fact Sheet for Patients: BloggerCourse.com  Fact Sheet for Healthcare Providers: SeriousBroker.it  This test is not yet approved or cleared by the [place] FDA and has been authorized for detection and/or diagnosis of SARS-CoV-2 by FDA under an Emergency Use Authorization (EUA). This EUA will remain in effect (meaning this test can be used) for the duration of the COVID-19 declaration under Section 564(b)(1) of the Act, 21 U.S.C. section 360bbb-3(b)(1), unless the authorization is terminated or revoked.  Performed at [facility], [address], [place], [place] [address]   Culture, blood (routine x 2)     Status: None (Preliminary result)   Collection Time: 09/20/22  8:17 PM   Specimen: BLOOD  Result Value Ref Range Status   Specimen Description   Final    BLOOD BLOOD RIGHT FOREARM Performed at [facility], [address] [place], [place] [address]    Special Requests   Final    Blood Culture adequate volume BOTTLES DRAWN AEROBIC AND ANAEROBIC Performed at [facility], [address] [place], [place] [address]    Culture   Final    NO GROWTH 2 DAYS Performed at [facility], [address], [place], [place] [address]    Report Status PENDING  Incomplete  MRSA Next Gen by PCR, Nasal     Status: None   Collection Time: 09/21/22 12:06 AM   Specimen: Nasal Mucosa; Nasal Swab  Result Value Ref Range Status   MRSA by PCR Next Gen NOT DETECTED NOT DETECTED  Final    Comment: (NOTE) The GeneXpert MRSA Assay (FDA approved for NASAL specimens only), is one component of a comprehensive MRSA colonization surveillance program. It is not intended to diagnose MRSA infection nor to guide or monitor treatment for MRSA infections. Test performance is not FDA approved in patients less than 33 years old. Performed at [facility], [address] [place], [place] [address]   Urine Culture     Status: None   Collection Time: 09/21/22  6:30 AM   Specimen: Urine, Random  Result Value Ref Range Status   Specimen Description   Final    URINE, RANDOM Performed at [facility], [address] [place], [place] [address]    Special Requests   Final    NONE Reflexed from [id number] Performed at [facility], [address] [place], [place] [address]    Culture   Final    NO GROWTH Performed at [facility], [address] [place], [place] [address]    Report Status 09/22/2022 FINAL  Final         Radiology Studies: No results found.      Scheduled Meds:  azithromycin  500 mg Oral QHS   Chlorhexidine Gluconate Cloth  6 each Topical Daily   enoxaparin (LOVENOX) injection  40 mg Subcutaneous Q24H   guaiFENesin  600 mg Oral BID   ipratropium-albuterol  3 mL Nebulization QID   oxybutynin  15 mg Oral QHS   pantoprazole  40 mg Oral BID   predniSONE  40 mg Oral Q breakfast   Continuous Infusions:  sodium chloride Stopped (09/22/22 0936)   cefTRIAXone (ROCEPHIN)  IV 2 g (09/22/22 2034)     LOS: 2 days    Time spent: 45 minutes spent on chart review, discussion with nursing staff, consultants,  updating family and interview/physical exam; more than 50% of that time was spent in counseling and/or coordination of care.    Joseph ArtJessica U Jalik Gellatly, DO [facility] Available via Epic secure chat 7am-7pm After these hours, please refer to coverage provider listed on [email or web address] 09/23/2022, 10:56 AM

## 2022-09-23 NOTE — Evaluation (Signed)
Physical Therapy Evaluation Patient Details Name: Samuel Becker MRN: [id number] DOB: [birth year] Today's Date: 09/23/2022  History of Present Illness  32 yo male admitted with acute respiratory failure 2* Pna, sepsis. Hx of spina bifida, neurogenic bladder  Clinical Impression  On eval, pt required Min A for bed mobility and Min guard A for scoot transfer bed>recliner. HR 120 bpm, O2 95% on 3L, dyspnea 2/4 with activity. Cues for pursed lip breathing and rest breaks as needed. Pt has WC and bil AFOs in his room. Encouraged pt to sit as tolerated but no longer than 3 hours at a time. Encouraged use of respiratory exercises devices. Will plan to follow pt during hospital stay. Do not anticipate any f/u PT needs at this time but will update recommendations as needed.      Recommendations for follow up therapy are one component of a multi-disciplinary discharge planning process, led by the attending physician.  Recommendations may be updated based on patient status, additional functional criteria and insurance authorization.  Follow Up Recommendations       Assistance Recommended at Discharge PRN  Patient can return home with the following  Assist for transportation;Help with stairs or ramp for entrance;Assistance with cooking/housework    Equipment Recommendations None recommended by PT  Recommendations for Other Services       Functional Status Assessment Patient has had a recent decline in their functional status and demonstrates the ability to make significant improvements in function in a reasonable and predictable amount of time.     Precautions / Restrictions Precautions Precautions: Fall Precaution Comments: monitor HR, O2 Required Braces or Orthoses:  (has bil AFOs that he wears at baseline) Restrictions Weight Bearing Restrictions: No RLE Weight Bearing: Non weight bearing LLE Weight Bearing: Non weight bearing      Mobility  Bed Mobility Overal bed mobility: Needs  Assistance Bed Mobility: Supine to Sit     Supine to sit: HOB elevated, Min assist     General bed mobility comments: Pt used bedrail. Increased time. Assist to scoot to EOB enough to perform transfer. Dyspnea 2/4.    Transfers Overall transfer level: Needs assistance   Transfers: Bed to chair/wheelchair/BSC            Lateral/Scoot Transfers: Min guard General transfer comment: Min guard A for safety. No physical assistance provided. Pt performed scoot transfer towards R side. Increased time. Dyspnea 2/4. Cues for pursed lip breathing.    Ambulation/Gait               General Gait Details: nonambulatory  Stairs            Wheelchair Mobility    Modified Rankin (Stroke Patients Only)       Balance Overall balance assessment: Needs assistance Sitting-balance support: Bilateral upper extremity supported, Feet unsupported Sitting balance-Leahy Scale: Good Sitting balance - Comments: tends to lean laterally once positioned in recliner-propped pillows as necessary.                                     Pertinent Vitals/Pain Pain Assessment Pain Assessment: No/denies pain    Home Living Family/patient expects to be discharged to:: Private residence Living Arrangements: Parent Available Help at Discharge: Family Type of Home: House Home Access: Level entry       Home Layout: Able to live on main level with bedroom/bathroom Home Equipment: Wheelchair - manual;Wheelchair - power (shower/toilet chair)  Prior Function               Mobility Comments: mod I with scoot tranfers, WC mobility       Hand Dominance        Extremity/Trunk Assessment   Upper Extremity Assessment Upper Extremity Assessment: Overall WFL for tasks assessed    Lower Extremity Assessment Lower Extremity Assessment:  (no funtional use of LEs 2* spina bifida)       Communication   Communication: No difficulties  Cognition Arousal/Alertness:  Awake/alert Behavior During Therapy: WFL for tasks assessed/performed Overall Cognitive Status: Within Functional Limits for tasks assessed                                 General Comments: family tends to answer for him        General Comments      Exercises     Assessment/Plan    PT Assessment Patient needs continued PT services  PT Problem List Decreased activity tolerance;Decreased mobility;Decreased knowledge of use of DME       PT Treatment Interventions Functional mobility training;DME instruction;Patient/family education;Therapeutic activities    PT Goals (Current goals can be found in the Care Plan section)  Acute Rehab PT Goals Patient Stated Goal: to get better and go home PT Goal Formulation: With patient/family Time For Goal Achievement: 10/07/22 Potential to Achieve Goals: Good    Frequency Min 3X/week     Co-evaluation               AM-PAC PT "6 Clicks" Mobility  Outcome Measure Help needed turning from your back to your side while in a flat bed without using bedrails?: A Little Help needed moving from lying on your back to sitting on the side of a flat bed without using bedrails?: A Little Help needed moving to and from a bed to a chair (including a wheelchair)?: A Little Help needed standing up from a chair using your arms (e.g., wheelchair or bedside chair)?: Total Help needed to walk in hospital room?: Total Help needed climbing 3-5 steps with a railing? : Total 6 Click Score: 12    End of Session Equipment Utilized During Treatment: Oxygen Activity Tolerance: Patient tolerated treatment well Patient left: in chair;with call bell/phone within reach;with family/visitor present Nurse Communication: Mobility status      Time: 4920-1007 PT Time Calculation (min) (ACUTE ONLY): 26 min   Charges:   PT Evaluation $PT Eval Low Complexity: 1 Low PT Treatments $Therapeutic Activity: 8-22 mins           Faye Ramsay,  PT Acute Rehabilitation  Office: [phone]

## 2022-09-24 ENCOUNTER — Inpatient Hospital Stay (HOSPITAL_COMMUNITY): Payer: Medicare Other

## 2022-09-24 DIAGNOSIS — J9601 Acute respiratory failure with hypoxia: Secondary | ICD-10-CM | POA: Diagnosis not present

## 2022-09-24 MED ORDER — LORATADINE 10 MG PO TABS
10.0000 mg | ORAL_TABLET | Freq: Every day | ORAL | Status: DC
  Administered 2022-09-24 – 2022-09-26 (×3): 10 mg via ORAL
  Filled 2022-09-24 (×3): qty 1

## 2022-09-24 MED ORDER — FUROSEMIDE 10 MG/ML IJ SOLN
20.0000 mg | Freq: Once | INTRAMUSCULAR | Status: AC
  Administered 2022-09-24: 20 mg via INTRAVENOUS
  Filled 2022-09-24: qty 2

## 2022-09-24 NOTE — Progress Notes (Signed)
Patient was asleep in cpap and tolerating well. Family member removed cpap oxygen saturation decreased 86 % on room air RR 22, 3 liters oxygen applied and saturation increased to 96 %, RR 18. Patient reports feeling some better now that o2 has been applied at 3 lt. Respirations are now equal and non labored. Will attempt weaning later in the day.

## 2022-09-24 NOTE — Progress Notes (Signed)
PROGRESS NOTE    Samuel Becker  ZOX:[id number] DOB: 1990-07-13 DOA: 09/20/2022 PCP: Moshe Cipro, NP    Brief Narrative:  Samuel Becker is a 32 y.o. male with medical history significant for spina bifida, neurogenic bladder, who initially presented to droppage ED with complaints of sudden onset shortness of breath that started on the day of presentation.  Denies aspirating.  Associated with a dry cough.   In the ED, workup was negative for COVID-19, influenza AMB.  Chest x-ray showed right lower lobe infiltrates consistent with pneumonia.  Lab studies were remarkable for leukocytosis.  Vital signs were notable for elevated temperature 100.3, tachycardia 107, tachypnea 24.  An ABG showed acute respiratory acidosis.  The patient was placed on BiPAP.  The patient was admitted for sepsis secondary to community-acquired pneumonia.    Assessment and Plan: Acute hypoxic and hypercarbic respiratory failure secondary to right lower lobe community-acquired pneumonia Not on oxygen supplementation at baseline Was placed on BiPAP on presentation- wean to [place] and wean to off -still wheezing, added steroids -repeat x ray suggestive of fluid-- will do low dose lasix and monitor  GERD -IV protonix   Sepsis secondary to right lower lobe community-acquired pneumonia -SLP-- treat acid reflux, regular diet Aspiration precautions Started on Rocephin and IV azithromycin in the ED, continue Urine antigen strep pneumonia negative -pulmonary toilet  Hypomagnesemia -replete   Spina bifida Appears stable -high risk for recurrent PNA   Neurogenic bladder Resume home oxybutynin Self caths   Chronic constipation Water enemas (use soap suds while here)   DVT prophylaxis: enoxaparin (LOVENOX) injection 40 mg Start: 09/21/22 1000    Code Status: Full Code Family Communication: at bedside  Disposition Plan:  Level of care: Progressive Status is: Inpatient Remains inpatient  appropriate because: needs IV abx and weaned off O2    Consultants:  none   Subjective: O2 needs increased to 3L with movement  Objective: Vitals:   09/23/22 2000 09/24/22 0410 09/24/22 0752 09/24/22 0846  BP: 121/83 (!) 137/98  114/85  Pulse: (!) 110 97  95  Resp: 20 20  20   Temp: 98.3 F (36.8 C) 98 F (36.7 C)    TempSrc: Oral Oral    SpO2: 100% 100% 91% 99%  Weight:      Height:        Intake/Output Summary (Last 24 hours) at 09/24/2022 1043 Last data filed at 09/24/2022 0900 Gross per 24 hour  Intake 240 ml  Output 1150 ml  Net -910 ml   Filed Weights   09/21/22 0841  Weight: 42.3 kg    Examination:    General: Appearance:    Thin male in no acute distress, voice sounding stronger     Lungs:     respirations unlabored  Heart:    Normal heart rate. .   MS:   All extremities are intact.   Neurologic:   Awake, alert         Data Reviewed: I have personally reviewed following labs and imaging studies  CBC: Recent Labs  Lab 09/20/22 2012 09/20/22 2031 09/21/22 0310 09/22/22 0231 09/23/22 0617  WBC 19.2*  --  13.3* 8.6 7.6  NEUTROABS 15.3*  --  10.1*  --   --   HGB 17.2* 17.0 13.7 13.1 12.7*  HCT 53.9* 50.0 42.6 42.0 40.9  MCV 95.4  --  95.3 97.7 97.6  PLT 307  --  164 146* 161   Basic Metabolic Panel: Recent Labs  Lab 09/20/22 2012  09/20/22 2031 09/21/22 0310 09/22/22 0226 09/22/22 0231 09/23/22 0617  NA 140 138 139  --  139 136  K 4.2 3.7 3.9  --  4.4 4.2  CL 100  --  104  --  104 103  CO2 27  --  27  --  29 26  GLUCOSE 215*  --  105*  --  97 94  BUN 12  --  12  --  10 7  CREATININE 1.20  --  0.76  --  0.67 0.59*  CALCIUM 9.9  --  8.2*  --  8.4* 8.3*  MG  --   --  1.6* 2.3  --   --   PHOS  --   --  4.3  --   --   --    GFR: Estimated Creatinine Clearance: 79.3 mL/min (A) (by C-G formula based on SCr of 0.59 mg/dL (L)). Liver Function Tests: Recent Labs  Lab 09/20/22 2012 09/21/22 0310  AST 21 13*  ALT 13 12  ALKPHOS 60 37*   BILITOT 0.3 0.4  PROT 7.7 5.7*  ALBUMIN 4.8 3.5   No results for input(s): "LIPASE", "AMYLASE" in the last 168 hours. No results for input(s): "AMMONIA" in the last 168 hours. Coagulation Profile: No results for input(s): "INR", "PROTIME" in the last 168 hours. Cardiac Enzymes: No results for input(s): "CKTOTAL", "CKMB", "CKMBINDEX", "TROPONINI" in the last 168 hours. BNP (last 3 results) No results for input(s): "PROBNP" in the last 8760 hours. HbA1C: No results for input(s): "HGBA1C" in the last 72 hours. CBG: No results for input(s): "GLUCAP" in the last 168 hours. Lipid Profile: No results for input(s): "CHOL", "HDL", "LDLCALC", "TRIG", "CHOLHDL", "LDLDIRECT" in the last 72 hours. Thyroid Function Tests: No results for input(s): "TSH", "T4TOTAL", "FREET4", "T3FREE", "THYROIDAB" in the last 72 hours. Anemia Panel: No results for input(s): "VITAMINB12", "FOLATE", "FERRITIN", "TIBC", "IRON", "RETICCTPCT" in the last 72 hours. Sepsis Labs: Recent Labs  Lab 09/20/22 2030 09/21/22 0123 09/21/22 0310 09/21/22 0503  PROCALCITON  --   --   --  0.13  LATICACIDVEN 3.5* 1.2 1.2  --     Recent Results (from the past 240 hour(s))  Culture, blood (routine x 2)     Status: None (Preliminary result)   Collection Time: 09/20/22  8:05 PM   Specimen: BLOOD  Result Value Ref Range Status   Specimen Description   Final    BLOOD LEFT ANTECUBITAL Performed at [facility], [address], [place], [place] [address]    Special Requests   Final    Blood Culture adequate volume BOTTLES DRAWN AEROBIC AND ANAEROBIC Performed at [facility], [address], [place], [place] [address]    Culture   Final    NO GROWTH 3 DAYS Performed at [facility], [address] [place], [place] [address]    Report Status PENDING  Incomplete  Resp panel by RT-PCR (RSV, Flu A&B, Covid) Anterior Nasal Swab     Status: None   Collection Time: 09/20/22  8:12 PM    Specimen: Anterior Nasal Swab  Result Value Ref Range Status   SARS Coronavirus 2 by RT PCR NEGATIVE NEGATIVE Final    Comment: (NOTE) SARS-CoV-2 target nucleic acids are NOT DETECTED.  The SARS-CoV-2 RNA is generally detectable in upper respiratory specimens during the acute phase of infection. The lowest concentration of SARS-CoV-2 viral copies this assay can detect is 138 copies/mL. A negative result does not preclude SARS-Cov-2 infection and should  not be used as the sole basis for treatment or other patient management decisions. A negative result may occur with  improper specimen collection/handling, submission of specimen other than nasopharyngeal swab, presence of viral mutation(s) within the areas targeted by this assay, and inadequate number of viral copies(<138 copies/mL). A negative result must be combined with clinical observations, patient history, and epidemiological information. The expected result is Negative.  Fact Sheet for Patients:  BloggerCourse.com  Fact Sheet for Healthcare Providers:  SeriousBroker.it  This test is no t yet approved or cleared by the [place] FDA and  has been authorized for detection and/or diagnosis of SARS-CoV-2 by FDA under an Emergency Use Authorization (EUA). This EUA will remain  in effect (meaning this test can be used) for the duration of the COVID-19 declaration under Section 564(b)(1) of the Act, 21 U.S.C.section 360bbb-3(b)(1), unless the authorization is terminated  or revoked sooner.       Influenza A by PCR NEGATIVE NEGATIVE Final   Influenza B by PCR NEGATIVE NEGATIVE Final    Comment: (NOTE) The Xpert Xpress SARS-CoV-2/FLU/RSV plus assay is intended as an aid in the diagnosis of influenza from Nasopharyngeal swab specimens and should not be used as a sole basis for treatment. Nasal washings and aspirates are unacceptable for Xpert Xpress  SARS-CoV-2/FLU/RSV testing.  Fact Sheet for Patients: BloggerCourse.com  Fact Sheet for Healthcare Providers: SeriousBroker.it  This test is not yet approved or cleared by the [place] FDA and has been authorized for detection and/or diagnosis of SARS-CoV-2 by FDA under an Emergency Use Authorization (EUA). This EUA will remain in effect (meaning this test can be used) for the duration of the COVID-19 declaration under Section 564(b)(1) of the Act, 21 U.S.C. section 360bbb-3(b)(1), unless the authorization is terminated or revoked.     Resp Syncytial Virus by PCR NEGATIVE NEGATIVE Final    Comment: (NOTE) Fact Sheet for Patients: BloggerCourse.com  Fact Sheet for Healthcare Providers: SeriousBroker.it  This test is not yet approved or cleared by the [place] FDA and has been authorized for detection and/or diagnosis of SARS-CoV-2 by FDA under an Emergency Use Authorization (EUA). This EUA will remain in effect (meaning this test can be used) for the duration of the COVID-19 declaration under Section 564(b)(1) of the Act, 21 U.S.C. section 360bbb-3(b)(1), unless the authorization is terminated or revoked.  Performed at [facility], [address] [place], [place] [address]   Culture, blood (routine x 2)     Status: None (Preliminary result)   Collection Time: 09/20/22  8:17 PM   Specimen: BLOOD  Result Value Ref Range Status   Specimen Description   Final    BLOOD BLOOD RIGHT FOREARM Performed at [facility], [address] [place], [place] [address]    Special Requests   Final    Blood Culture adequate volume BOTTLES DRAWN AEROBIC AND ANAEROBIC Performed at [facility], [address], [place], [place] [address]    Culture   Final    NO GROWTH 3 DAYS Performed at [facility], [address] [place], [place] [address]    Report Status PENDING  Incomplete  MRSA Next Gen by PCR, Nasal     Status: None   Collection Time: 09/21/22 12:06 AM   Specimen: Nasal Mucosa; Nasal Swab  Result Value Ref Range Status   MRSA by PCR Next Gen NOT DETECTED NOT DETECTED Final    Comment: (NOTE) The GeneXpert MRSA Assay (FDA approved for  NASAL specimens only), is one component of a comprehensive MRSA colonization surveillance program. It is not intended to diagnose MRSA infection nor to guide or monitor treatment for MRSA infections. Test performance is not FDA approved in patients less than 65 years old. Performed at [facility], [address] [place], [place] [address]   Urine Culture     Status: None   Collection Time: 09/21/22  6:30 AM   Specimen: Urine, Random  Result Value Ref Range Status   Specimen Description   Final    URINE, RANDOM Performed at [facility], [address] [place], [place] [address]    Special Requests   Final    NONE Reflexed from [id number] Performed at [facility], [address] [place], [place] [address]    Culture   Final    NO GROWTH Performed at [facility], [address], [place], [place] [address]    Report Status 09/22/2022 FINAL  Final         Radiology Studies: DG CHEST PORT 1 VIEW  Result Date: 09/24/2022 CLINICAL DATA:  32 year old male with history of pneumonia. EXAM: PORTABLE CHEST - 1 VIEW COMPARISON:  09/20/2022 FINDINGS: Interval increased obscuration of the cardiomediastinal silhouette. Low lung volumes. Diffuse hazy pulmonary opacities. No definite evidence of significant pleural effusion or pneumothorax. 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 Similar appearance of thoracolumbar fixation rods with severe thoracolumbar scoliosis and ankylosis. No acute osseous abnormality. IMPRESSION: Low lung volumes  with worsening diffuse hazy pulmonary opacities, which could represent pulmonary edema or multifocal pneumonia. Electronically Signed   By: Marliss Coots M.D.   On: 09/24/2022 08:15        Scheduled Meds:  azithromycin  500 mg Oral QHS   Chlorhexidine Gluconate Cloth  6 each Topical Daily   enoxaparin (LOVENOX) injection  40 mg Subcutaneous Q24H   furosemide  20 mg Intravenous Once   ipratropium-albuterol  3 mL Nebulization QID   ketotifen  1 drop Both Eyes BID   loratadine  10 mg Oral Daily   oxybutynin  15 mg Oral QHS   pantoprazole  40 mg Oral BID   predniSONE  40 mg Oral Q breakfast   Continuous Infusions:  sodium chloride Stopped (09/22/22 0936)   cefTRIAXone (ROCEPHIN)  IV Stopped (09/23/22 2057)     LOS: 3 days    Time spent: 45 minutes spent on chart review, discussion with nursing staff, consultants, updating family and interview/physical exam; more than 50% of that time was spent in counseling and/or coordination of care.    Joseph Art, DO [facility] Available via Epic secure chat 7am-7pm After these hours, please refer to coverage provider listed on [email or web address] 09/24/2022, 10:43 AM

## 2022-09-24 NOTE — Progress Notes (Signed)
Attempted to wean oxygen. Patient is now on 3 liters oxygen saturation 94 %. Respirations are equal and non-labored.

## 2022-09-24 NOTE — Progress Notes (Signed)
The patient has been in/out catheterization x 6 today. Patients family provider supply 14 f catheters from home. Pt complained of discomfort with 16 F.

## 2022-09-25 ENCOUNTER — Inpatient Hospital Stay (HOSPITAL_COMMUNITY): Payer: Medicare Other

## 2022-09-25 DIAGNOSIS — J9601 Acute respiratory failure with hypoxia: Secondary | ICD-10-CM | POA: Diagnosis not present

## 2022-09-25 LAB — RESPIRATORY PANEL BY PCR

## 2022-09-25 LAB — BASIC METABOLIC PANEL
Anion gap: 9 (ref 5–15)
BUN: 13 mg/dL (ref 6–20)
CO2: 30 mmol/L (ref 22–32)
Calcium: 9.1 mg/dL (ref 8.9–10.3)
Chloride: 99 mmol/L (ref 98–111)
Creatinine, Ser: 0.71 mg/dL (ref 0.61–1.24)
GFR, Estimated: >60 mL/min (ref >60)
Glucose, Bld: 109 mg/dL — ABNORMAL HIGH (ref 70–99)
Potassium: 3.8 mmol/L (ref 3.5–5.1)
Sodium: 138 mmol/L (ref 135–145)

## 2022-09-25 LAB — CBC
HCT: 42.8 % (ref 39.0–52.0)
Hemoglobin: 13.4 g/dL (ref 13.0–17.0)
MCH: 30.2 pg (ref 26.0–34.0)
MCHC: 31.3 g/dL (ref 30.0–36.0)
MCV: 96.6 fL (ref 80.0–100.0)
Platelets: 225 10*3/uL (ref 150–400)
RBC: 4.43 MIL/uL (ref 4.22–5.81)
RDW: 12.6 % (ref 11.5–15.5)
WBC: 8.6 10*3/uL (ref 4.0–10.5)
nRBC: 0 % (ref 0.0–0.2)

## 2022-09-25 LAB — LEGIONELLA PNEUMOPHILA SEROGP 1 UR AG: L. pneumophila Serogp 1 Ur Ag: NEGATIVE

## 2022-09-25 MED ORDER — ORAL CARE MOUTH RINSE: 15.0000 mL | OROMUCOSAL | Status: DC | PRN

## 2022-09-25 MED ORDER — GUAIFENESIN ER 600 MG PO TB12
600.0000 mg | ORAL_TABLET | Freq: Two times a day (BID) | ORAL | Status: DC
  Administered 2022-09-25 – 2022-09-26 (×3): 600 mg via ORAL
  Filled 2022-09-25 (×3): qty 1

## 2022-09-25 NOTE — Plan of Care (Signed)
  Problem: Clinical Measurements: Goal: Respiratory complications will improve Outcome: Progressing   

## 2022-09-25 NOTE — Progress Notes (Signed)
PROGRESS NOTE    Samuel Becker  Samuel Becker DOB: 08-14-1990 DOA: 09/20/2022 PCP: Moshe Cipro, NP    Brief Narrative:  Samuel Becker is a 32 y.o. male with medical history significant for spina bifida, neurogenic bladder, who initially presented to droppage ED with complaints of sudden onset shortness of breath that started on the day of presentation.  Denies aspirating.  Associated with a dry cough.   In the ED, workup was negative for COVID-19, influenza AMB.  Chest x-ray showed right lower lobe infiltrates consistent with pneumonia.  Lab studies were remarkable for leukocytosis.  Vital signs were notable for elevated temperature 100.3, tachycardia 107, tachypnea 24.  An ABG showed acute respiratory acidosis.  The patient was placed on BiPAP.  The patient was admitted for sepsis secondary to community-acquired pneumonia.  Slow to improve  Assessment and Plan: Acute hypoxic and hypercarbic respiratory failure secondary to right lower lobe community-acquired pneumonia Not on oxygen supplementation at baseline Was placed on BiPAP on presentation- wean to [place] and wean to off -still wheezing, added steroids -repeat x ray  4/7 suggestive of fluid-- will do low dose lasix and monitor -repeat x ray 4/8 -NP swab to r/o virus  GERD -IV protonix   Sepsis secondary to right lower lobe community-acquired pneumonia -SLP-- treat acid reflux, regular diet Aspiration precautions Started on Rocephin and IV azithromycin in the ED, continue Urine antigen strep pneumonia negative -pulmonary toilet -wean O2  Hypomagnesemia -replete   Spina bifida Appears stable -high risk for recurrent PNA   Neurogenic bladder Resume home oxybutynin Self caths   Chronic constipation Water enemas (use soap suds while here)   DVT prophylaxis: enoxaparin (LOVENOX) injection 40 mg Start: 09/21/22 1000    Code Status: Full Code Family Communication: at bedside  Disposition Plan:   Level of care: Med-Surg Status is: Inpatient Remains inpatient appropriate because: needs IV abx and weaned off O2    Consultants:  none   Subjective: Voice still sounding wet  Objective: Vitals:   09/24/22 1253 09/24/22 1619 09/24/22 2114 09/25/22 0420  BP:   116/85 114/88  Pulse:   (!) 106 81  Resp:   16 20  Temp:   97.9 F (36.6 C) (!) 97.5 F (36.4 C)  TempSrc:   Oral Oral  SpO2: 95% 98% 99% 100%  Weight:      Height:        Intake/Output Summary (Last 24 hours) at 09/25/2022 0923 Last data filed at 09/25/2022 0439 Gross per 24 hour  Intake 927.12 ml  Output 2925 ml  Net -1997.88 ml   Filed Weights   09/21/22 0841  Weight: 42.3 kg    Examination:     General: Appearance:    Thin male in no acute distress     Lungs:     respirations unlabored, diminished at bases  Heart:    Normal heart rate.   MS:   All extremities are intact.   Neurologic:   Awake, alert      Data Reviewed: I have personally reviewed following labs and imaging studies  CBC: Recent Labs  Lab 09/20/22 2012 09/20/22 2031 09/21/22 0310 09/22/22 0231 09/23/22 0617 09/25/22 0423  WBC 19.2*  --  13.3* 8.6 7.6 8.6  NEUTROABS 15.3*  --  10.1*  --   --   --   HGB 17.2* 17.0 13.7 13.1 12.7* 13.4  HCT 53.9* 50.0 42.6 42.0 40.9 42.8  MCV 95.4  --  95.3 97.7 97.6 96.6  PLT  307  --  164 146* 161 225   Basic Metabolic Panel: Recent Labs  Lab 09/20/22 2012 09/20/22 2031 09/21/22 0310 09/22/22 0226 09/22/22 0231 09/23/22 0617 09/25/22 0423  NA 140 138 139  --  139 136 138  K 4.2 3.7 3.9  --  4.4 4.2 3.8  CL 100  --  104  --  104 103 99  CO2 27  --  27  --  29 26 30   GLUCOSE 215*  --  105*  --  97 94 109*  BUN 12  --  12  --  10 7 13   CREATININE 1.20  --  0.76  --  0.67 0.59* 0.71  CALCIUM 9.9  --  8.2*  --  8.4* 8.3* 9.1  MG  --   --  1.6* 2.3  --   --   --   PHOS  --   --  4.3  --   --   --   --    GFR: Estimated Creatinine Clearance: 79.3 mL/min (by C-G formula based on  SCr of 0.71 mg/dL). Liver Function Tests: Recent Labs  Lab 09/20/22 2012 09/21/22 0310  AST 21 13*  ALT 13 12  ALKPHOS 60 37*  BILITOT 0.3 0.4  PROT 7.7 5.7*  ALBUMIN 4.8 3.5   No results for input(s): "LIPASE", "AMYLASE" in the last 168 hours. No results for input(s): "AMMONIA" in the last 168 hours. Coagulation Profile: No results for input(s): "INR", "PROTIME" in the last 168 hours. Cardiac Enzymes: No results for input(s): "CKTOTAL", "CKMB", "CKMBINDEX", "TROPONINI" in the last 168 hours. BNP (last 3 results) No results for input(s): "PROBNP" in the last 8760 hours. HbA1C: No results for input(s): "HGBA1C" in the last 72 hours. CBG: No results for input(s): "GLUCAP" in the last 168 hours. Lipid Profile: No results for input(s): "CHOL", "HDL", "LDLCALC", "TRIG", "CHOLHDL", "LDLDIRECT" in the last 72 hours. Thyroid Function Tests: No results for input(s): "TSH", "T4TOTAL", "FREET4", "T3FREE", "THYROIDAB" in the last 72 hours. Anemia Panel: No results for input(s): "VITAMINB12", "FOLATE", "FERRITIN", "TIBC", "IRON", "RETICCTPCT" in the last 72 hours. Sepsis Labs: Recent Labs  Lab 09/20/22 2030 09/21/22 0123 09/21/22 0310 09/21/22 0503  PROCALCITON  --   --   --  0.13  LATICACIDVEN 3.5* 1.2 1.2  --     Recent Results (from the past 240 hour(s))  Culture, blood (routine x 2)     Status: None (Preliminary result)   Collection Time: 09/20/22  8:05 PM   Specimen: BLOOD  Result Value Ref Range Status   Specimen Description   Final    BLOOD LEFT ANTECUBITAL Performed at [facility], [address], [place], [place] [address]    Special Requests   Final    Blood Culture adequate volume BOTTLES DRAWN AEROBIC AND ANAEROBIC Performed at [facility], [address], [place], [place] [address]    Culture   Final    NO GROWTH 4 DAYS Performed at [facility], [address], [place], [place] [address]    Report Status  PENDING  Incomplete  Resp panel by RT-PCR (RSV, Flu A&B, Covid) Anterior Nasal Swab     Status: None   Collection Time: 09/20/22  8:12 PM   Specimen: Anterior Nasal Swab  Result Value Ref Range Status   SARS Coronavirus 2 by RT PCR NEGATIVE NEGATIVE Final    Comment: (NOTE) SARS-CoV-2 target nucleic acids are NOT DETECTED.  The SARS-CoV-2 RNA is generally detectable in  upper respiratory specimens during the acute phase of infection. The lowest concentration of SARS-CoV-2 viral copies this assay can detect is 138 copies/mL. A negative result does not preclude SARS-Cov-2 infection and should not be used as the sole basis for treatment or other patient management decisions. A negative result may occur with  improper specimen collection/handling, submission of specimen other than nasopharyngeal swab, presence of viral mutation(s) within the areas targeted by this assay, and inadequate number of viral copies(<138 copies/mL). A negative result must be combined with clinical observations, patient history, and epidemiological information. The expected result is Negative.  Fact Sheet for Patients:  BloggerCourse.com  Fact Sheet for Healthcare Providers:  SeriousBroker.it  This test is no t yet approved or cleared by the [place] FDA and  has been authorized for detection and/or diagnosis of SARS-CoV-2 by FDA under an Emergency Use Authorization (EUA). This EUA will remain  in effect (meaning this test can be used) for the duration of the COVID-19 declaration under Section 564(b)(1) of the Act, 21 U.S.C.section 360bbb-3(b)(1), unless the authorization is terminated  or revoked sooner.       Influenza A by PCR NEGATIVE NEGATIVE Final   Influenza B by PCR NEGATIVE NEGATIVE Final    Comment: (NOTE) The Xpert Xpress SARS-CoV-2/FLU/RSV plus assay is intended as an aid in the diagnosis of influenza from Nasopharyngeal swab specimens  and should not be used as a sole basis for treatment. Nasal washings and aspirates are unacceptable for Xpert Xpress SARS-CoV-2/FLU/RSV testing.  Fact Sheet for Patients: BloggerCourse.com  Fact Sheet for Healthcare Providers: SeriousBroker.it  This test is not yet approved or cleared by the [place] FDA and has been authorized for detection and/or diagnosis of SARS-CoV-2 by FDA under an Emergency Use Authorization (EUA). This EUA will remain in effect (meaning this test can be used) for the duration of the COVID-19 declaration under Section 564(b)(1) of the Act, 21 U.S.C. section 360bbb-3(b)(1), unless the authorization is terminated or revoked.     Resp Syncytial Virus by PCR NEGATIVE NEGATIVE Final    Comment: (NOTE) Fact Sheet for Patients: BloggerCourse.com  Fact Sheet for Healthcare Providers: SeriousBroker.it  This test is not yet approved or cleared by the [place] FDA and has been authorized for detection and/or diagnosis of SARS-CoV-2 by FDA under an Emergency Use Authorization (EUA). This EUA will remain in effect (meaning this test can be used) for the duration of the COVID-19 declaration under Section 564(b)(1) of the Act, 21 U.S.C. section 360bbb-3(b)(1), unless the authorization is terminated or revoked.  Performed at [facility], [address], [place], [place] [address]   Culture, blood (routine x 2)     Status: None (Preliminary result)   Collection Time: 09/20/22  8:17 PM   Specimen: BLOOD  Result Value Ref Range Status   Specimen Description   Final    BLOOD BLOOD RIGHT FOREARM Performed at [facility], [address] [place], [place] [address]    Special Requests   Final    Blood Culture adequate volume BOTTLES DRAWN AEROBIC AND ANAEROBIC Performed at [facility], [address], [place], [place] [address]    Culture   Final    NO GROWTH 4 DAYS Performed at [facility], [address] [place], [place] [address]    Report Status PENDING  Incomplete  MRSA Next Gen by PCR, Nasal     Status: None   Collection Time: 09/21/22 12:06 AM   Specimen: Nasal  Mucosa; Nasal Swab  Result Value Ref Range Status   MRSA by PCR Next Gen NOT DETECTED NOT DETECTED Final    Comment: (NOTE) The GeneXpert MRSA Assay (FDA approved for NASAL specimens only), is one component of a comprehensive MRSA colonization surveillance program. It is not intended to diagnose MRSA infection nor to guide or monitor treatment for MRSA infections. Test performance is not FDA approved in patients less than 33 years old. Performed at [facility], [address] [place], [place] [address]   Urine Culture     Status: None   Collection Time: 09/21/22  6:30 AM   Specimen: Urine, Random  Result Value Ref Range Status   Specimen Description   Final    URINE, RANDOM Performed at [facility], [address] [place], [place] [address]    Special Requests   Final    NONE Reflexed from [id number] Performed at [facility], [address] [place], [place] [address]    Culture   Final    NO GROWTH Performed at [facility], [address] [place], [place] [address]    Report Status 09/22/2022 FINAL  Final         Radiology Studies: DG CHEST PORT 1 VIEW  Result Date: 09/24/2022 CLINICAL DATA:  32 year old male with history of pneumonia. EXAM: PORTABLE CHEST - 1 VIEW COMPARISON:  09/20/2022 FINDINGS: Interval increased obscuration of the cardiomediastinal silhouette. Low lung volumes. Diffuse hazy pulmonary opacities. No definite evidence of significant pleural effusion or pneumothorax. 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 Similar appearance of thoracolumbar fixation  rods with severe thoracolumbar scoliosis and ankylosis. No acute osseous abnormality. IMPRESSION: Low lung volumes with worsening diffuse hazy pulmonary opacities, which could represent pulmonary edema or multifocal pneumonia. Electronically Signed   By: Marliss Cootsylan  Suttle M.D.   On: 09/24/2022 08:15        Scheduled Meds:  azithromycin  500 mg Oral QHS   enoxaparin (LOVENOX) injection  40 mg Subcutaneous Q24H   ipratropium-albuterol  3 mL Nebulization QID   ketotifen  1 drop Both Eyes BID   loratadine  10 mg Oral Daily   oxybutynin  15 mg Oral QHS   pantoprazole  40 mg Oral BID   predniSONE  40 mg Oral Q breakfast   Continuous Infusions:  sodium chloride Stopped (09/22/22 0936)   cefTRIAXone (ROCEPHIN)  IV Stopped (09/24/22 2200)     LOS: 4 days    Time spent: 45 minutes spent on chart review, discussion with nursing staff, consultants, updating family and interview/physical exam; more than 50% of that time was spent in counseling and/or coordination of care.    Samuel ArtJessica U Anissia Wessells, DO [facility] Available via Epic secure chat 7am-7pm After these hours, please refer to coverage provider listed on [email or web address] 09/25/2022, 9:23 AM

## 2022-09-25 NOTE — Evaluation (Signed)
Physical Therapy Evaluation Patient Details Name: Samuel Becker MRN: [id number] DOB: [birth year] Today's Date: 09/25/2022  History of Present Illness  32 yo male admitted with acute respiratory failure 2* Pna, sepsis. Hx of spina bifida, neurogenic bladder  Clinical Impression  Patient resting in recliner on arrival and requesting return to bed, pt reports he is limiting his time up to 3 hours at a time to protect his skin. Pt required set up assist to position chair for transfer towards stronger Lt side with return to bed. Min guard for safety with no physical assist needed. Pt able to use bil UE to clear hips effectively for lateral scoot transfers and family present and has provided safe guarding for pt for his entire life. Pt and family report he is transferring at his baseline level. Anticipate pt will recover well. 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 Discuss plan to reach out to primary for OP follow up. No further skilled acute PT needs, therapy signing off at this time.     Recommendations for follow up therapy are one component of a multi-disciplinary discharge planning process, led by the attending physician.  Recommendations may be updated based on patient status, additional functional criteria and insurance authorization.  Follow Up Recommendations       Assistance Recommended at Discharge PRN  Patient can return home with the following  Assist for transportation;Help with stairs or ramp for entrance;Assistance with cooking/housework    Equipment Recommendations None recommended by PT  Recommendations for Other Services       Functional Status Assessment       Precautions / Restrictions Precautions Precautions: Fall Precaution Comments: monitor HR, O2 Restrictions Weight Bearing Restrictions:  Yes RLE Weight Bearing: Non weight bearing LLE Weight Bearing: Non weight bearing      Mobility  Bed Mobility Overal bed mobility: Needs Assistance Bed Mobility: Sit to Supine       Sit to supine: Supervision, HOB elevated   General bed mobility comments: pt able to pivot sit EOB>supine with use of bed rail to control turniing trunk. pt able to brign LE's in to bed with extra time/effort. 2+ to boost superiorly.    Transfers Overall transfer level: Needs assistance Equipment used: None Transfers: Bed to chair/wheelchair/BSC            Lateral/Scoot Transfers: Min guard General transfer comment: Set up assist to position chair with pt to transfer towards Lt side/strong side. min guard for safety. per pt/family this is his baseline.    Ambulation/Gait                  Stairs            Wheelchair Mobility    Modified Rankin (Stroke Patients Only)       Balance Overall balance assessment: Needs assistance Sitting-balance support: Bilateral upper extremity supported, Feet unsupported Sitting balance-Leahy Scale: Good       Standing balance-Leahy Scale: Zero                               Pertinent Vitals/Pain Pain Assessment Pain Assessment: No/denies pain    Home Living  Prior Function                       Hand Dominance        Extremity/Trunk Assessment                Communication      Cognition Arousal/Alertness: Awake/alert Behavior During Therapy: WFL for tasks assessed/performed Overall Cognitive Status: Within Functional Limits for tasks assessed                                 General Comments: family tends to answer for him        General Comments      Exercises     Assessment/Plan    PT Assessment    PT Problem List         PT Treatment Interventions      PT Goals (Current goals can be found in the Care Plan section)  Acute  Rehab PT Goals Patient Stated Goal: to get better and go home PT Goal Formulation: With patient/family Time For Goal Achievement: 10/07/22 Potential to Achieve Goals: Good    Frequency Min 3X/week     Co-evaluation               AM-PAC PT "6 Clicks" Mobility  Outcome Measure Help needed turning from your back to your side while in a flat bed without using bedrails?: A Little Help needed moving from lying on your back to sitting on the side of a flat bed without using bedrails?: A Little Help needed moving to and from a bed to a chair (including a wheelchair)?: A Little Help needed standing up from a chair using your arms (e.g., wheelchair or bedside chair)?: Total Help needed to walk in hospital room?: Total Help needed climbing 3-5 steps with a railing? : Total 6 Click Score: 12    End of Session Equipment Utilized During Treatment: Oxygen Activity Tolerance: Patient tolerated treatment well Patient left: in bed;with call bell/phone within reach;with family/visitor present Nurse Communication: Mobility status      Time: 7903-8333 PT Time Calculation (min) (ACUTE ONLY): 17 min   Charges:     PT Treatments $Therapeutic Activity: 8-22 mins        Wynn Maudlin, DPT Acute Rehabilitation Services Office [phone]  09/25/22 1:38 PM

## 2022-09-25 NOTE — Care Management Important Message (Signed)
Important Message  Patient Details IM Letter given Name: Samuel Becker MRN: [id number] Date of Birth: 12-14-1990   Medicare Important Message Given:  Yes     Caren Macadam 09/25/2022, 12:42 PM

## 2022-09-26 DIAGNOSIS — J9601 Acute respiratory failure with hypoxia: Secondary | ICD-10-CM | POA: Diagnosis not present

## 2022-09-26 LAB — CULTURE, BLOOD (ROUTINE X 2)
Culture: NO GROWTH
Culture: NO GROWTH
Special Requests: ADEQUATE
Special Requests: ADEQUATE

## 2022-09-26 MED ORDER — DOXYCYCLINE HYCLATE 100 MG PO TABS
100.0000 mg | ORAL_TABLET | Freq: Two times a day (BID) | ORAL | Status: DC
  Administered 2022-09-26: 100 mg via ORAL
  Filled 2022-09-26: qty 1

## 2022-09-26 MED ORDER — ALBUTEROL SULFATE (2.5 MG/3ML) 0.083% IN NEBU: 2.5000 mg | INHALATION_SOLUTION | Freq: Four times a day (QID) | RESPIRATORY_TRACT | 1 refills | Status: DC | PRN

## 2022-09-26 MED ORDER — PANTOPRAZOLE SODIUM 40 MG PO TBEC: 40.0000 mg | DELAYED_RELEASE_TABLET | Freq: Two times a day (BID) | ORAL | 0 refills | Status: DC

## 2022-09-26 MED ORDER — PREDNISONE 20 MG PO TABS: 40.0000 mg | ORAL_TABLET | Freq: Every day | ORAL | 0 refills | Status: DC

## 2022-09-26 MED ORDER — DOXYCYCLINE HYCLATE 100 MG PO TABS: 100.0000 mg | ORAL_TABLET | Freq: Two times a day (BID) | ORAL | 0 refills | Status: DC

## 2022-09-26 MED ORDER — GUAIFENESIN ER 600 MG PO TB12: 600.0000 mg | ORAL_TABLET | Freq: Two times a day (BID) | ORAL | 0 refills | Status: AC

## 2022-09-26 NOTE — TOC Initial Note (Signed)
Transition of Care [facility]) - Initial/Assessment Note    Patient Details  Name: Samuel Becker MRN: [id number] Date of Birth: 1990/07/23  Transition of Care [facility] CM/SW Contact:    Howell Rucks, RN Phone Number: 09/26/2022, 9:29 AM  Clinical Narrative:   Met with pt and family in room, introduced TOC role and review for home nebulizer order, pt/family no preference. [employer] contacted-Nebulizer to be delivered to pt room prior to dc. No further TOC needs identified at this time.   Expected Discharge Plan: Home/Self Care Barriers to Discharge: Barriers Resolved   Patient Goals and CMS Choice Patient states their goals for this hospitalization and ongoing recovery are:: Home with family CMS Medicare.gov Compare Post Acute Care list provided to:: Patient Choice offered to / list presented to : Patient, Parent      Expected Discharge Plan and Services   Discharge Planning Services: CM Consult   Living arrangements for the past 2 months: Single Family Home Expected Discharge Date: 09/26/22               DME Arranged: Nebulizer machine DME Agency: [employer] Date DME Agency Contacted: 09/26/22 Time DME Agency Contacted: [phone] Representative spoke with at DME Agency: Vaughan Basta            Prior Living Arrangements/Services Living arrangements for the past 2 months: Single Family Home Lives with:: Parents Patient language and need for interpreter reviewed:: Yes Do you feel safe going back to the place where you live?: Yes      Need for Family Participation in Patient Care: Yes (Comment) Care giver support system in place?: Yes (comment) Current home services: Homehealth aide Criminal Activity/Legal Involvement Pertinent to Current Situation/Hospitalization: No - Comment as needed  Activities of Daily Living Home Assistive Devices/Equipment: Wheelchair ADL Screening (condition at time of admission) Patient's cognitive ability adequate to safely complete daily  activities?: Yes Is the patient deaf or have difficulty hearing?: No Does the patient have difficulty seeing, even when wearing glasses/contacts?: No Does the patient have difficulty concentrating, remembering, or making decisions?: No Patient able to express need for assistance with ADLs?: Yes Does the patient have difficulty dressing or bathing?: Yes Independently performs ADLs?: No Communication: Independent Dressing (OT): Needs assistance Is this a change from baseline?: Pre-admission baseline Grooming: Needs assistance Is this a change from baseline?: Pre-admission baseline Feeding: Needs assistance Is this a change from baseline?: Pre-admission baseline Bathing: Needs assistance Is this a change from baseline?: Pre-admission baseline Toileting: Needs assistance Is this a change from baseline?: Pre-admission baseline In/Out Bed: Needs assistance Is this a change from baseline?: Pre-admission baseline Walks in Home: Dependent Is this a change from baseline?: Pre-admission baseline Does the patient have difficulty walking or climbing stairs?: Yes Weakness of Legs: Both Weakness of Arms/Hands: Both  Permission Sought/Granted Permission sought to share information with : Case Manager Permission granted to share information with : Yes, Verbal Permission Granted  Share Information with NAME: Fannie Knee, RN CN           Emotional Assessment Appearance:: Appears stated age Attitude/Demeanor/Rapport: Gracious Affect (typically observed): Accepting Orientation: : Oriented to Self, Oriented to Place, Oriented to  Time, Oriented to Situation Alcohol / Substance Use: Not Applicable Psych Involvement: No (comment)  Admission diagnosis:  Acute respiratory failure with hypoxia [J96.01] Community acquired pneumonia of right lung, unspecified part of lung [J18.9] Patient Active Problem List   Diagnosis Date Noted   Acute respiratory failure with hypoxia 09/20/2022   PCP:  Moshe Cipro, NP Pharmacy:   [facility] [phone] [place], [place] - [address] AT [facility] [address] [facility] [address] [facility] [address] [place] [address] Phone: [phone] Fax: [phone]     Social Determinants of Health (SDOH) Social History: SDOH Screenings   Food Insecurity: No Food Insecurity (09/21/2022)  Housing: Low Risk  (09/21/2022)  Transportation Needs: No Transportation Needs (09/21/2022)  Utilities: Not At Risk (09/21/2022)  Tobacco Use: Low Risk  (09/20/2022)   SDOH Interventions:     Readmission Risk Interventions    09/26/2022    9:25 AM 09/21/2022    3:22 PM  Readmission Risk Prevention Plan  Post Dischage Appt Complete Complete  Medication Screening Complete Complete  Transportation Screening Complete Complete

## 2022-09-26 NOTE — Discharge Summary (Signed)
Physician Discharge Summary  ERRIK MITCHELLE Becker:[id number] DOB: 1991-06-12 DOA: 09/20/2022  PCP: Moshe Cipro, NP  Admit date: 09/20/2022 Discharge date: 09/26/2022  Admitted From: home Discharge disposition: home   Recommendations for Outpatient Follow-Up:   Home health orders placed Close follow up with consideration of ENT or GI referral if continues to have changes in voice   Discharge Diagnosis:   Principal Problem:   Acute respiratory failure with hypoxia    Discharge Condition: Improved.  Diet recommendation:  Regular.  Wound care: None.  Code status: Full.   History of Present Illness:   Samuel Becker is a 32 y.o. male with medical history significant for spina bifida, neurogenic bladder, who initially presented to droppage ED with complaints of sudden onset shortness of breath that started on the day of presentation.  Denies aspirating.  Associated with a dry cough.   In the ED, workup was negative for COVID-19, influenza AMB.  Chest x-ray showed right lower lobe infiltrates consistent with pneumonia.  Lab studies were remarkable for leukocytosis.  Vital signs were notable for elevated temperature 100.3, tachycardia 107, tachypnea 24.  An ABG showed acute respiratory acidosis.  The patient was placed on BiPAP.  The patient was admitted for sepsis secondary to community-acquired pneumonia.  Accepted by Dr. Joneen Roach and transferred to [facility] stepdown unit as inpatient status.     Hospital Course by Problem:   Acute hypoxic and hypercarbic respiratory failure secondary to right lower lobe community-acquired pneumonia Not on oxygen supplementation at baseline Was placed on BiPAP on presentation- wean to [place] and wean to off -steroid burst -repeat x ray  4/7 suggestive of fluid-- s/p lasix with diuresis and ability to wean O2 -repeat x ray 4/8 with improvement of appearance of lung -NP swab negative   GERD - protonix BID -close  outpatient follow up   Sepsis secondary to right lower lobe community-acquired pneumonia -SLP-- treat acid reflux, regular diet Aspiration precautions Started on Rocephin and IV azithromycin in the ED-- change to PO meds Urine antigen strep pneumonia negative -pulmonary toilet -wean O2   Hypomagnesemia -repleted   Spina bifida Appears stable -high risk for recurrent PNA- continue pulm toiletry   Neurogenic bladder Resume home oxybutynin Self caths   Chronic constipation Water enemas at home      Medical Consultants:      Discharge Exam:   Vitals:   09/25/22 2031 09/26/22 0425  BP: 130/83 107/81  Pulse: (!) 110 98  Resp: 19 18  Temp: 98.2 F (36.8 C) 97.9 F (36.6 C)  SpO2: 97% 99%   Vitals:   09/25/22 1411 09/25/22 1539 09/25/22 2031 09/26/22 0425  BP: 104/68  130/83 107/81  Pulse: (!) 106  (!) 110 98  Resp: 19  19 18   Temp: 97.8 F (36.6 C)  98.2 F (36.8 C) 97.9 F (36.6 C)  TempSrc: Oral  Oral Oral  SpO2: 96% 97% 97% 99%  Weight:      Height:        General exam: Appears calm and comfortable.    The results of significant diagnostics from this hospitalization (including imaging, microbiology, ancillary and laboratory) are listed below for reference.     Procedures and Diagnostic Studies:   DG Chest Portable 1 View  Result Date: 09/20/2022 CLINICAL DATA:  Shortness of breath EXAM: PORTABLE CHEST 1 VIEW COMPARISON:  Chest radiograph dated December 13, 2021. FINDINGS: The heart is normal in size. Severe levoscoliosis with Harrington rods,  unchanged. Right basilar opacity suggesting atelectasis or infiltrate. 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 IMPRESSION: Right basilar opacity suggesting atelectasis or infiltrate. Follow-up examination to resolution is recommended. Advanced scoliosis with thoracic spine hardware, unchanged. Electronically Signed   By: Larose Hires D.O.   On: 09/20/2022 21:20     Labs:   Basic Metabolic  Panel: Recent Labs  Lab 09/20/22 2012 09/20/22 2031 09/21/22 0310 09/22/22 0226 09/22/22 0231 09/23/22 0617 09/25/22 0423  NA 140 138 139  --  139 136 138  K 4.2 3.7 3.9  --  4.4 4.2 3.8  CL 100  --  104  --  104 103 99  CO2 27  --  27  --  29 26 30   GLUCOSE 215*  --  105*  --  97 94 109*  BUN 12  --  12  --  10 7 13   CREATININE 1.20  --  0.76  --  0.67 0.59* 0.71  CALCIUM 9.9  --  8.2*  --  8.4* 8.3* 9.1  MG  --   --  1.6* 2.3  --   --   --   PHOS  --   --  4.3  --   --   --   --    GFR Estimated Creatinine Clearance: 79.3 mL/min (by C-G formula based on SCr of 0.71 mg/dL). Liver Function Tests: Recent Labs  Lab 09/20/22 2012 09/21/22 0310  AST 21 13*  ALT 13 12  ALKPHOS 60 37*  BILITOT 0.3 0.4  PROT 7.7 5.7*  ALBUMIN 4.8 3.5   No results for input(s): "LIPASE", "AMYLASE" in the last 168 hours. No results for input(s): "AMMONIA" in the last 168 hours. Coagulation profile No results for input(s): "INR", "PROTIME" in the last 168 hours.  CBC: Recent Labs  Lab 09/20/22 2012 09/20/22 2031 09/21/22 0310 09/22/22 0231 09/23/22 0617 09/25/22 0423  WBC 19.2*  --  13.3* 8.6 7.6 8.6  NEUTROABS 15.3*  --  10.1*  --   --   --   HGB 17.2* 17.0 13.7 13.1 12.7* 13.4  HCT 53.9* 50.0 42.6 42.0 40.9 42.8  MCV 95.4  --  95.3 97.7 97.6 96.6  PLT 307  --  164 146* 161 225   Cardiac Enzymes: No results for input(s): "CKTOTAL", "CKMB", "CKMBINDEX", "TROPONINI" in the last 168 hours. BNP: Invalid input(s): "POCBNP" CBG: No results for input(s): "GLUCAP" in the last 168 hours. D-Dimer No results for input(s): "DDIMER" in the last 72 hours. Hgb A1c No results for input(s): "HGBA1C" in the last 72 hours. Lipid Profile No results for input(s): "CHOL", "HDL", "LDLCALC", "TRIG", "CHOLHDL", "LDLDIRECT" in the last 72 hours. Thyroid function studies No results for input(s): "TSH", "T4TOTAL", "T3FREE", "THYROIDAB" in the last 72 hours.  Invalid input(s): "FREET3" Anemia work  up No results for input(s): "VITAMINB12", "FOLATE", "FERRITIN", "TIBC", "IRON", "RETICCTPCT" in the last 72 hours. Microbiology Recent Results (from the past 240 hour(s))  Culture, blood (routine x 2)     Status: None   Collection Time: 09/20/22  8:05 PM   Specimen: BLOOD  Result Value Ref Range Status   Specimen Description   Final    BLOOD LEFT ANTECUBITAL Performed at [facility], [address] [place], [place] [address]    Special Requests   Final    Blood Culture adequate volume BOTTLES DRAWN AEROBIC AND ANAEROBIC Performed at [facility], [address] [place], [place] [address]  Culture   Final    NO GROWTH 5 DAYS Performed at [facility], [address], [place], [place] [address]    Report Status 09/26/2022 FINAL  Final  Resp panel by RT-PCR (RSV, Flu A&B, Covid) Anterior Nasal Swab     Status: None   Collection Time: 09/20/22  8:12 PM   Specimen: Anterior Nasal Swab  Result Value Ref Range Status   SARS Coronavirus 2 by RT PCR NEGATIVE NEGATIVE Final    Comment: (NOTE) SARS-CoV-2 target nucleic acids are NOT DETECTED.  The SARS-CoV-2 RNA is generally detectable in upper respiratory specimens during the acute phase of infection. The lowest concentration of SARS-CoV-2 viral copies this assay can detect is 138 copies/mL. A negative result does not preclude SARS-Cov-2 infection and should not be used as the sole basis for treatment or other patient management decisions. A negative result may occur with  improper specimen collection/handling, submission of specimen other than nasopharyngeal swab, presence of viral mutation(s) within the areas targeted by this assay, and inadequate number of viral copies(<138 copies/mL). A negative result must be combined with clinical observations, patient history, and epidemiological information. The expected result is Negative.  Fact Sheet for Patients:   BloggerCourse.com  Fact Sheet for Healthcare Providers:  SeriousBroker.it  This test is no t yet approved or cleared by the [place] FDA and  has been authorized for detection and/or diagnosis of SARS-CoV-2 by FDA under an Emergency Use Authorization (EUA). This EUA will remain  in effect (meaning this test can be used) for the duration of the COVID-19 declaration under Section 564(b)(1) of the Act, 21 U.S.C.section 360bbb-3(b)(1), unless the authorization is terminated  or revoked sooner.       Influenza A by PCR NEGATIVE NEGATIVE Final   Influenza B by PCR NEGATIVE NEGATIVE Final    Comment: (NOTE) The Xpert Xpress SARS-CoV-2/FLU/RSV plus assay is intended as an aid in the diagnosis of influenza from Nasopharyngeal swab specimens and should not be used as a sole basis for treatment. Nasal washings and aspirates are unacceptable for Xpert Xpress SARS-CoV-2/FLU/RSV testing.  Fact Sheet for Patients: BloggerCourse.com  Fact Sheet for Healthcare Providers: SeriousBroker.it  This test is not yet approved or cleared by the [place] FDA and has been authorized for detection and/or diagnosis of SARS-CoV-2 by FDA under an Emergency Use Authorization (EUA). This EUA will remain in effect (meaning this test can be used) for the duration of the COVID-19 declaration under Section 564(b)(1) of the Act, 21 U.S.C. section 360bbb-3(b)(1), unless the authorization is terminated or revoked.     Resp Syncytial Virus by PCR NEGATIVE NEGATIVE Final    Comment: (NOTE) Fact Sheet for Patients: BloggerCourse.com  Fact Sheet for Healthcare Providers: SeriousBroker.it  This test is not yet approved or cleared by the [place] FDA and has been authorized for detection and/or diagnosis of SARS-CoV-2 by FDA under an Emergency Use  Authorization (EUA). This EUA will remain in effect (meaning this test can be used) for the duration of the COVID-19 declaration under Section 564(b)(1) of the Act, 21 U.S.C. section 360bbb-3(b)(1), unless the authorization is terminated or revoked.  Performed at [facility], [address] [place], [place] [address]   Culture, blood (routine x 2)     Status: None   Collection Time: 09/20/22  8:17 PM   Specimen: BLOOD  Result Value Ref Range Status   Specimen Description   Final    BLOOD BLOOD RIGHT FOREARM Performed at [facility], [address], [place], [place] [address]    Special Requests   Final    Blood Culture adequate volume BOTTLES DRAWN AEROBIC AND ANAEROBIC Performed at [facility], [address], [place], [place] [address]    Culture   Final    NO GROWTH 5 DAYS Performed at [facility], [address] [place], [place] [address]    Report Status 09/26/2022 FINAL  Final  MRSA Next Gen by PCR, Nasal     Status: None   Collection Time: 09/21/22 12:06 AM   Specimen: Nasal Mucosa; Nasal Swab  Result Value Ref Range Status   MRSA by PCR Next Gen NOT DETECTED NOT DETECTED Final    Comment: (NOTE) The GeneXpert MRSA Assay (FDA approved for NASAL specimens only), is one component of a comprehensive MRSA colonization surveillance program. It is not intended to diagnose MRSA infection nor to guide or monitor treatment for MRSA infections. Test performance is not FDA approved in patients less than 33 years old. Performed at [facility], [address] [place], [place] [address]   Urine Culture     Status: None   Collection Time: 09/21/22  6:30 AM   Specimen: Urine, Random  Result Value Ref Range Status   Specimen Description   Final    URINE, RANDOM Performed at [facility], [address] [place], [place] [address]    Special Requests   Final    NONE  Reflexed from [id number] Performed at [facility], [address] [place], [place] [address]    Culture   Final    NO GROWTH Performed at [facility], [address] [place], [place] [address]    Report Status 09/22/2022 FINAL  Final  Respiratory (~20 pathogens) panel by PCR     Status: None   Collection Time: 09/25/22  9:40 AM   Specimen: Nasopharyngeal Swab; Respiratory  Result Value Ref Range Status   Adenovirus NOT DETECTED NOT DETECTED Final   Coronavirus 229E NOT DETECTED NOT DETECTED Final    Comment: (NOTE) The Coronavirus on the Respiratory Panel, DOES NOT test for the novel  Coronavirus (2019 nCoV)    Coronavirus HKU1 NOT DETECTED NOT DETECTED Final   Coronavirus NL63 NOT DETECTED NOT DETECTED Final   Coronavirus OC43 NOT DETECTED NOT DETECTED Final   Metapneumovirus NOT DETECTED NOT DETECTED Final   Rhinovirus / Enterovirus NOT DETECTED NOT DETECTED Final   Influenza A NOT DETECTED NOT DETECTED Final   Influenza B NOT DETECTED NOT DETECTED Final   Parainfluenza Virus 1 NOT DETECTED NOT DETECTED Final   Parainfluenza Virus 2 NOT DETECTED NOT DETECTED Final   Parainfluenza Virus 3 NOT DETECTED NOT DETECTED Final   Parainfluenza Virus 4 NOT DETECTED NOT DETECTED Final   Respiratory Syncytial Virus NOT DETECTED NOT DETECTED Final   Bordetella pertussis NOT DETECTED NOT DETECTED Final   Bordetella Parapertussis NOT DETECTED NOT DETECTED Final   Chlamydophila pneumoniae NOT DETECTED NOT DETECTED Final   Mycoplasma pneumoniae NOT DETECTED NOT DETECTED Final    Comment: Performed at [facility], [address] [place], [place] [address]     Discharge Instructions:   Discharge Instructions     Diet general   Complete by: As directed    Discharge instructions   Complete by: As directed    Follow up with PCP-- may need ENT referral to look at your vocal cords vs GI referral for EGD  for GERD I am going to work on getting you a nebulizer  with medicine to have at home -continue incentive spirometry and flutter valve as needed to help you cough/mobilize secretions   Increase activity slowly   Complete by: As directed    No wound care   Complete by: As directed       Allergies as of 09/26/2022       Reactions   Morphine And Related Other (See Comments)   Hallucinations    Septra [sulfamethoxazole-trimethoprim] Other (See Comments)   Gi upset    Sulfamethoxazole Other (See Comments)   Gi upset    Trimethoprim Other (See Comments)   Unknown    Latex Swelling, Rash        Medication List     TAKE these medications    acetaminophen 325 MG tablet Commonly known as: TYLENOL Take 325 mg by mouth as needed for moderate pain.   albuterol (2.5 MG/3ML) 0.083% nebulizer solution Commonly known as: PROVENTIL Take 3 mLs (2.5 mg total) by nebulization every 6 (six) hours as needed for wheezing or shortness of breath.   ALLERGY RELIEF PO Take by mouth as needed (congestion).   cetirizine 10 MG tablet Commonly known as: ZYRTEC Take 10 mg by mouth daily.   doxycycline 100 MG tablet Commonly known as: VIBRA-TABS Take 1 tablet (100 mg total) by mouth every 12 (twelve) hours.   guaiFENesin 600 MG 12 hr tablet Commonly known as: MUCINEX Take 1 tablet (600 mg total) by mouth 2 (two) times daily for 5 days.   ibuprofen 200 MG tablet Commonly known as: ADVIL Take 200 mg by mouth as needed for moderate pain.   multivitamin with minerals tablet Take 1 tablet by mouth daily.   oxybutynin 15 MG 24 hr tablet Commonly known as: DITROPAN XL Take 15 mg by mouth daily.   pantoprazole 40 MG tablet Commonly known as: PROTONIX Take 1 tablet (40 mg total) by mouth 2 (two) times daily.   predniSONE 20 MG tablet Commonly known as: DELTASONE Take 2 tablets (40 mg total) by mouth daily with breakfast.   VITAMIN D PO Take 1 capsule by mouth daily.               Durable Medical Equipment  (From admission, onward)            Start     Ordered   09/26/22 0759  For home use only DME Nebulizer machine  Once       Question Answer Comment  Patient needs a nebulizer to treat with the following condition Pneumonia   Length of Need 6 Months      09/26/22 0758            Follow-up Information     Moshe Cipro, NP Follow up in 1 week(s).   Specialty: Family Medicine Contact information: [address] [place] [address] [phone]                  Time coordinating discharge: 45 min  Signed:  Joseph Art DO  [facility] 09/26/2022, 8:01 AM

## 2022-10-02 ENCOUNTER — Inpatient Hospital Stay (HOSPITAL_COMMUNITY)
Admission: EM | Admit: 2022-10-02 | Discharge: 2022-10-07 | DRG: 291 | Disposition: A | Discharge status: Home / Self Care | Payer: Medicare Other | Attending: Internal Medicine | Admitting: Internal Medicine

## 2022-10-02 ENCOUNTER — Emergency Department (HOSPITAL_COMMUNITY): Payer: Medicare Other

## 2022-10-02 ENCOUNTER — Other Ambulatory Visit: Payer: Self-pay

## 2022-10-02 ENCOUNTER — Encounter (HOSPITAL_COMMUNITY): Payer: Self-pay

## 2022-10-02 DIAGNOSIS — I5020 Unspecified systolic (congestive) heart failure: Secondary | ICD-10-CM

## 2022-10-02 DIAGNOSIS — R0902 Hypoxemia: Principal | ICD-10-CM

## 2022-10-02 DIAGNOSIS — Z883 Allergy status to other anti-infective agents status: Secondary | ICD-10-CM

## 2022-10-02 DIAGNOSIS — Z888 Allergy status to other drugs, medicaments and biological substances status: Secondary | ICD-10-CM

## 2022-10-02 DIAGNOSIS — Z885 Allergy status to narcotic agent status: Secondary | ICD-10-CM

## 2022-10-02 DIAGNOSIS — J9602 Acute respiratory failure with hypercapnia: Secondary | ICD-10-CM | POA: Diagnosis not present

## 2022-10-02 DIAGNOSIS — I2510 Atherosclerotic heart disease of native coronary artery without angina pectoris: Secondary | ICD-10-CM | POA: Diagnosis present

## 2022-10-02 DIAGNOSIS — E8779 Other fluid overload: Secondary | ICD-10-CM

## 2022-10-02 DIAGNOSIS — Z7952 Long term (current) use of systemic steroids: Secondary | ICD-10-CM

## 2022-10-02 DIAGNOSIS — Z79899 Other long term (current) drug therapy: Secondary | ICD-10-CM

## 2022-10-02 DIAGNOSIS — I5021 Acute systolic (congestive) heart failure: Secondary | ICD-10-CM | POA: Diagnosis present

## 2022-10-02 DIAGNOSIS — R7989 Other specified abnormal findings of blood chemistry: Secondary | ICD-10-CM | POA: Diagnosis not present

## 2022-10-02 DIAGNOSIS — R000 Tachycardia, unspecified: Secondary | ICD-10-CM | POA: Diagnosis present

## 2022-10-02 DIAGNOSIS — G8220 Paraplegia, unspecified: Secondary | ICD-10-CM | POA: Diagnosis present

## 2022-10-02 DIAGNOSIS — Z993 Dependence on wheelchair: Secondary | ICD-10-CM

## 2022-10-02 DIAGNOSIS — T3695XA Adverse effect of unspecified systemic antibiotic, initial encounter: Secondary | ICD-10-CM | POA: Diagnosis not present

## 2022-10-02 DIAGNOSIS — Q231 Congenital insufficiency of aortic valve: Secondary | ICD-10-CM

## 2022-10-02 DIAGNOSIS — I77810 Thoracic aortic ectasia: Secondary | ICD-10-CM | POA: Diagnosis present

## 2022-10-02 DIAGNOSIS — Q2381 Bicuspid aortic valve: Secondary | ICD-10-CM

## 2022-10-02 DIAGNOSIS — Z8249 Family history of ischemic heart disease and other diseases of the circulatory system: Secondary | ICD-10-CM | POA: Diagnosis not present

## 2022-10-02 DIAGNOSIS — J9601 Acute respiratory failure with hypoxia: Secondary | ICD-10-CM | POA: Diagnosis not present

## 2022-10-02 DIAGNOSIS — K5909 Other constipation: Secondary | ICD-10-CM | POA: Diagnosis present

## 2022-10-02 DIAGNOSIS — L89892 Pressure ulcer of other site, stage 2: Secondary | ICD-10-CM | POA: Diagnosis present

## 2022-10-02 DIAGNOSIS — I2489 Other forms of acute ischemic heart disease: Secondary | ICD-10-CM | POA: Diagnosis present

## 2022-10-02 DIAGNOSIS — Z7401 Bed confinement status: Secondary | ICD-10-CM

## 2022-10-02 DIAGNOSIS — J9622 Acute and chronic respiratory failure with hypercapnia: Secondary | ICD-10-CM | POA: Diagnosis present

## 2022-10-02 DIAGNOSIS — Z1152 Encounter for screening for COVID-19: Secondary | ICD-10-CM | POA: Diagnosis not present

## 2022-10-02 DIAGNOSIS — E876 Hypokalemia: Secondary | ICD-10-CM | POA: Diagnosis present

## 2022-10-02 DIAGNOSIS — K219 Gastro-esophageal reflux disease without esophagitis: Secondary | ICD-10-CM | POA: Diagnosis present

## 2022-10-02 DIAGNOSIS — L8990 Pressure ulcer of unspecified site, unspecified stage: Secondary | ICD-10-CM | POA: Insufficient documentation

## 2022-10-02 DIAGNOSIS — N319 Neuromuscular dysfunction of bladder, unspecified: Secondary | ICD-10-CM | POA: Diagnosis present

## 2022-10-02 DIAGNOSIS — I5023 Acute on chronic systolic (congestive) heart failure: Secondary | ICD-10-CM | POA: Diagnosis not present

## 2022-10-02 DIAGNOSIS — Z881 Allergy status to other antibiotic agents status: Secondary | ICD-10-CM | POA: Diagnosis not present

## 2022-10-02 DIAGNOSIS — Y92239 Unspecified place in hospital as the place of occurrence of the external cause: Secondary | ICD-10-CM | POA: Diagnosis not present

## 2022-10-02 DIAGNOSIS — Q059 Spina bifida, unspecified: Secondary | ICD-10-CM

## 2022-10-02 DIAGNOSIS — K521 Toxic gastroenteritis and colitis: Secondary | ICD-10-CM | POA: Diagnosis not present

## 2022-10-02 DIAGNOSIS — I5032 Chronic diastolic (congestive) heart failure: Secondary | ICD-10-CM

## 2022-10-02 DIAGNOSIS — I420 Dilated cardiomyopathy: Secondary | ICD-10-CM | POA: Diagnosis present

## 2022-10-02 DIAGNOSIS — J9621 Acute and chronic respiratory failure with hypoxia: Secondary | ICD-10-CM | POA: Diagnosis present

## 2022-10-02 DIAGNOSIS — J9811 Atelectasis: Secondary | ICD-10-CM | POA: Diagnosis present

## 2022-10-02 DIAGNOSIS — Z882 Allergy status to sulfonamides status: Secondary | ICD-10-CM

## 2022-10-02 DIAGNOSIS — J189 Pneumonia, unspecified organism: Secondary | ICD-10-CM | POA: Diagnosis present

## 2022-10-02 DIAGNOSIS — I509 Heart failure, unspecified: Secondary | ICD-10-CM | POA: Diagnosis not present

## 2022-10-02 DIAGNOSIS — Z91040 Latex allergy status: Secondary | ICD-10-CM

## 2022-10-02 HISTORY — DX: Unspecified systolic (congestive) heart failure: I50.20

## 2022-10-02 LAB — URINALYSIS, ROUTINE W REFLEX MICROSCOPIC
Bilirubin Urine: NEGATIVE
Glucose, UA: NEGATIVE mg/dL
Hgb urine dipstick: NEGATIVE
Ketones, ur: NEGATIVE mg/dL
Leukocytes,Ua: NEGATIVE
Nitrite: NEGATIVE
Protein, ur: 100 mg/dL — AB
Specific Gravity, Urine: 1.028 (ref 1.005–1.030)
pH: 5 (ref 5.0–8.0)

## 2022-10-02 LAB — COMPREHENSIVE METABOLIC PANEL
ALT: 18 U/L (ref 0–44)
AST: 17 U/L (ref 15–41)
Albumin: 3.8 g/dL (ref 3.5–5.0)
Alkaline Phosphatase: 46 U/L (ref 38–126)
Anion gap: 7 (ref 5–15)
BUN: 11 mg/dL (ref 6–20)
CO2: 30 mmol/L (ref 22–32)
Calcium: 8.4 mg/dL — ABNORMAL LOW (ref 8.9–10.3)
Chloride: 100 mmol/L (ref 98–111)
Creatinine, Ser: 1.04 mg/dL (ref 0.61–1.24)
GFR, Estimated: >60 mL/min (ref >60)
Glucose, Bld: 134 mg/dL — ABNORMAL HIGH (ref 70–99)
Potassium: 4.3 mmol/L (ref 3.5–5.1)
Sodium: 137 mmol/L (ref 135–145)
Total Bilirubin: 0.3 mg/dL (ref 0.3–1.2)
Total Protein: 6.7 g/dL (ref 6.5–8.1)

## 2022-10-02 LAB — LACTIC ACID, PLASMA: Lactic Acid, Venous: 1.7 mmol/L (ref 0.5–1.9)

## 2022-10-02 LAB — CBC WITH DIFFERENTIAL/PLATELET
Abs Immature Granulocytes: 0.08 10*3/uL — ABNORMAL HIGH (ref 0.00–0.07)
Basophils Absolute: 0.1 10*3/uL (ref 0.0–0.1)
Basophils Relative: 0 %
Eosinophils Absolute: 0.1 10*3/uL (ref 0.0–0.5)
Eosinophils Relative: 1 %
HCT: 46.9 % (ref 39.0–52.0)
Hemoglobin: 14.7 g/dL (ref 13.0–17.0)
Immature Granulocytes: 1 %
Lymphocytes Relative: 6 %
Lymphs Abs: 1 10*3/uL (ref 0.7–4.0)
MCH: 30.7 pg (ref 26.0–34.0)
MCHC: 31.3 g/dL (ref 30.0–36.0)
MCV: 97.9 fL (ref 80.0–100.0)
Monocytes Absolute: 1.1 10*3/uL — ABNORMAL HIGH (ref 0.1–1.0)
Monocytes Relative: 7 %
Neutro Abs: 13.5 10*3/uL — ABNORMAL HIGH (ref 1.7–7.7)
Neutrophils Relative %: 85 %
Platelets: 290 10*3/uL (ref 150–400)
RBC: 4.79 MIL/uL (ref 4.22–5.81)
RDW: 12.5 % (ref 11.5–15.5)
WBC: 15.7 10*3/uL — ABNORMAL HIGH (ref 4.0–10.5)
nRBC: 0 % (ref 0.0–0.2)

## 2022-10-02 LAB — BLOOD GAS, VENOUS
Acid-Base Excess: 6.6 mmol/L — ABNORMAL HIGH (ref 0.0–2.0)
Acid-Base Excess: 3.6 mmol/L — ABNORMAL HIGH (ref 0.0–2.0)
Bicarbonate: 36.2 mmol/L — ABNORMAL HIGH (ref 20.0–28.0)
Bicarbonate: 32 mmol/L — ABNORMAL HIGH (ref 20.0–28.0)
O2 Saturation: 44.8 %
O2 Saturation: 68.5 %
Patient temperature: 37
Patient temperature: 37
pCO2, Ven: 77 mmHg (ref 44–60)
pCO2, Ven: 65 mmHg — ABNORMAL HIGH (ref 44–60)
pH, Ven: 7.28 (ref 7.25–7.43)
pH, Ven: 7.3 (ref 7.25–7.43)
pO2, Ven: <31 mmHg — CL (ref 32–45)
pO2, Ven: 35 mmHg (ref 32–45)

## 2022-10-02 LAB — PHOSPHORUS: Phosphorus: 4.7 mg/dL — ABNORMAL HIGH (ref 2.5–4.6)

## 2022-10-02 LAB — SARS CORONAVIRUS 2 BY RT PCR: SARS Coronavirus 2 by RT PCR: NEGATIVE

## 2022-10-02 LAB — APTT: aPTT: 29 seconds (ref 24–36)

## 2022-10-02 LAB — TROPONIN I (HIGH SENSITIVITY)
Troponin I (High Sensitivity): 73 ng/L — ABNORMAL HIGH (ref <18)
Troponin I (High Sensitivity): 115 ng/L (ref <18)
Troponin I (High Sensitivity): 143 ng/L (ref <18)
Troponin I (High Sensitivity): 124 ng/L (ref <18)

## 2022-10-02 LAB — MAGNESIUM: Magnesium: 2.2 mg/dL (ref 1.7–2.4)

## 2022-10-02 LAB — D-DIMER, QUANTITATIVE: D-Dimer, Quant: <0.27 ug/mL-FEU (ref 0.00–0.50)

## 2022-10-02 LAB — PROTIME-INR
INR: 1.1 (ref 0.8–1.2)
Prothrombin Time: 13.7 seconds (ref 11.4–15.2)

## 2022-10-02 LAB — PROCALCITONIN: Procalcitonin: 0.1 ng/mL

## 2022-10-02 LAB — CBG MONITORING, ED: Glucose-Capillary: 121 mg/dL — ABNORMAL HIGH (ref 70–99)

## 2022-10-02 LAB — BRAIN NATRIURETIC PEPTIDE: B Natriuretic Peptide: 335.1 pg/mL — ABNORMAL HIGH (ref 0.0–100.0)

## 2022-10-02 LAB — TSH: TSH: 0.704 u[IU]/mL (ref 0.350–4.500)

## 2022-10-02 MED ORDER — ACETAMINOPHEN 325 MG PO TABS: 650.0000 mg | ORAL_TABLET | Freq: Four times a day (QID) | ORAL | Status: DC | PRN

## 2022-10-02 MED ORDER — VANCOMYCIN HCL IN DEXTROSE 1-5 GM/200ML-% IV SOLN
1000.0000 mg | Freq: Once | INTRAVENOUS | Status: AC
  Administered 2022-10-02: 1000 mg via INTRAVENOUS
  Filled 2022-10-02: qty 200

## 2022-10-02 MED ORDER — IPRATROPIUM BROMIDE 0.02 % IN SOLN
0.5000 mg | Freq: Once | RESPIRATORY_TRACT | Status: AC
  Administered 2022-10-02: 0.5 mg via RESPIRATORY_TRACT
  Filled 2022-10-02: qty 2.5

## 2022-10-02 MED ORDER — VANCOMYCIN HCL 750 MG/150ML IV SOLN: 750.0000 mg | INTRAVENOUS | Status: DC

## 2022-10-02 MED ORDER — SODIUM CHLORIDE 0.9 % IV SOLN
2.0000 g | Freq: Three times a day (TID) | INTRAVENOUS | Status: DC
  Administered 2022-10-03: 2 g via INTRAVENOUS
  Filled 2022-10-02: qty 12.5

## 2022-10-02 MED ORDER — MAGNESIUM SULFATE 2 GM/50ML IV SOLN
2.0000 g | Freq: Once | INTRAVENOUS | Status: AC
  Administered 2022-10-02: 2 g via INTRAVENOUS
  Filled 2022-10-02: qty 50

## 2022-10-02 MED ORDER — ACETAMINOPHEN 650 MG RE SUPP: 650.0000 mg | Freq: Four times a day (QID) | RECTAL | Status: DC | PRN

## 2022-10-02 MED ORDER — SODIUM CHLORIDE 0.9 % IV SOLN
250.0000 mL | INTRAVENOUS | Status: DC | PRN
  Administered 2022-10-03: 250 mL via INTRAVENOUS

## 2022-10-02 MED ORDER — SODIUM CHLORIDE 0.9% FLUSH: 3.0000 mL | INTRAVENOUS | Status: DC | PRN

## 2022-10-02 MED ORDER — ALBUTEROL SULFATE (2.5 MG/3ML) 0.083% IN NEBU: 2.5000 mg | INHALATION_SOLUTION | RESPIRATORY_TRACT | Status: DC | PRN

## 2022-10-02 MED ORDER — FUROSEMIDE 10 MG/ML IJ SOLN
40.0000 mg | Freq: Every day | INTRAMUSCULAR | Status: DC
  Administered 2022-10-03: 40 mg via INTRAVENOUS
  Filled 2022-10-02: qty 4

## 2022-10-02 MED ORDER — SODIUM CHLORIDE 0.9% FLUSH
3.0000 mL | Freq: Two times a day (BID) | INTRAVENOUS | Status: DC
  Administered 2022-10-02 – 2022-10-07 (×10): 3 mL via INTRAVENOUS

## 2022-10-02 MED ORDER — ALBUTEROL SULFATE (2.5 MG/3ML) 0.083% IN NEBU
2.5000 mg | INHALATION_SOLUTION | Freq: Once | RESPIRATORY_TRACT | Status: AC
  Administered 2022-10-02: 2.5 mg via RESPIRATORY_TRACT
  Filled 2022-10-02: qty 3

## 2022-10-02 MED ORDER — FUROSEMIDE 10 MG/ML IJ SOLN
40.0000 mg | Freq: Once | INTRAMUSCULAR | Status: AC
  Administered 2022-10-02: 40 mg via INTRAVENOUS
  Filled 2022-10-02: qty 4

## 2022-10-02 MED ORDER — ONDANSETRON HCL 4 MG PO TABS: 4.0000 mg | ORAL_TABLET | Freq: Four times a day (QID) | ORAL | Status: DC | PRN

## 2022-10-02 MED ORDER — SODIUM CHLORIDE 0.9 % IV SOLN
2.0000 g | Freq: Once | INTRAVENOUS | Status: AC
  Administered 2022-10-02: 2 g via INTRAVENOUS
  Filled 2022-10-02: qty 12.5

## 2022-10-02 MED ORDER — ONDANSETRON HCL 4 MG/2ML IJ SOLN: 4.0000 mg | Freq: Four times a day (QID) | INTRAMUSCULAR | Status: DC | PRN

## 2022-10-02 MED ORDER — LEVALBUTEROL HCL 1.25 MG/0.5ML IN NEBU
1.2500 mg | INHALATION_SOLUTION | Freq: Once | RESPIRATORY_TRACT | Status: AC
  Administered 2022-10-02: 1.25 mg via RESPIRATORY_TRACT
  Filled 2022-10-02: qty 0.5

## 2022-10-02 NOTE — Assessment & Plan Note (Addendum)
this patient has acute respiratory failure with Hypoxia  Hypercarbia as documented by the presence of following: O2 saturatio< 90% on RA pH <7.35 with pCO2 >50   Likely due to:   Pneumonia, CHF exacerbation,   Provide O2 therapy and titrate as needed  Continuous pulse ox   check Pulse ox with ambulation prior to discharge   may need  TC consult for home O2 set up Hypercapnia appears to be compensated patient currently neurologically back to baseline no evidence of somnolence BiPAP as needed  flutter valve ordered

## 2022-10-02 NOTE — ED Triage Notes (Signed)
Patient coming from home via [facility] with c/o respiratory distress starting 1 hour ago. Upon fire arrival SpO2 was in 30s. Pt wheelchair bound. Recently d/c from hospital with pneumonia.

## 2022-10-02 NOTE — H&P (Signed)
Samuel Becker YQM:[id number] DOB: [birth year] DOA: 10/02/2022     PCP: Moshe Cipro, NP   Outpatient Specialists:     Urology Dr.  Wyline Mood  Patient arrived to ER on 10/02/22 at 1628 Referred by Attending Therisa Doyne, MD   Patient coming from:    home Lives  With family    Chief Complaint:   Chief Complaint  Patient presents with   Respiratory Distress    HPI: Samuel Becker is a 32 y.o. male with medical history significant of  Spina bifida, Paraplegia, Neurogenic bladder    Presented with   shortness of breath  Comes in with recurrent shortness of breath.  Sudden onset of respiratory distress EMS arrival satting in the 30s on arrival to emergency department started on BiPAP initial VBG showing pCO2 of 77 he was noted to be fluid overload on chest x-ray in ER and received a dose of Lasix after which he was able to come off of BiPAP and now on 6 L nasal cannula satting 95-96%   Patient has had recent admission for pneumonia when he started to have shortness of breath previous workup was negative for COVID influenza and patient initially required BiPAP and admitted for community-acquired pneumonia He was able to be weaned off of BiPAP after given some steroids repeat imaging on 7 April showed fluid overload and he was given Lasix for diuresis with improvement Fluid overload felt to be secondary to aggressive fluid management in the setting of sepsis Patient was discharged on 9 April  Patient never had an echo Patient states shortness of breath occurred suddenly he was getting ready to get in the shower he started having significant shortness of breath turned purple checked his oxygen was down to 30% they called 911 Pt was in severe respiratory distress Reports similar to his prior admit  No hx of aspiration was not eating prior to this  No hx of CHF Has now worsening lower ext edema Normally no tachycardia His voice has been raspy     Denies  significant ETOH intake   Does not smoke   Lab Results  Component Value Date   SARSCOV2NAA NEGATIVE 10/02/2022   SARSCOV2NAA NEGATIVE 09/20/2022   SARSCOV2NAA NEGATIVE 05/26/2021      Regarding pertinent Chronic problems:      Neurogenic bladder on Ditropan   GERD on protonix      While in ER:  Initially required BiPAP currently on 6 L nasal cannula Patient is feeling better but not back to baseline Responded well to Lasix with good urine output    Lab Orders         Blood Culture (routine x 2)         Urine Culture (for pregnant, neutropenic or urologic patients or patients with an indwelling urinary catheter)         SARS Coronavirus 2 by RT PCR (hospital order, performed in [facility] hospital lab) *cepheid single result test*         Comprehensive metabolic panel         CBC with Differential         Protime-INR         APTT         Urinalysis, Routine w reflex microscopic -Urine, Clean Catch         Brain natriuretic peptide         Blood gas, venous (at [facility] and [facility])         Magnesium  Blood gas, venous (at [facility] and [facility])         Phosphorus         Prealbumin         Urinalysis, Complete w Microscopic -Urine, Clean Catch         TSH         D-dimer, quantitative         CBG monitoring, ED        CXR - Developing asymmetric bilateral airspace infiltrate, asymmetric pulmonary edema versus infection    Following Medications were ordered in ER: Medications  vancomycin (VANCOCIN) IVPB 1000 mg/200 mL premix (0 mg Intravenous Stopped 10/02/22 1822)  ceFEPIme (MAXIPIME) 2 g in sodium chloride 0.9 % 100 mL IVPB (0 g Intravenous Stopped 10/02/22 1754)  levalbuterol (XOPENEX) nebulizer solution 1.25 mg (1.25 mg Nebulization Given 10/02/22 1649)  ipratropium (ATROVENT) nebulizer solution 0.5 mg (0.5 mg Nebulization Given 10/02/22 1649)  albuterol (PROVENTIL) (2.5 MG/3ML) 0.083% nebulizer solution 2.5 mg (2.5 mg Nebulization Given 10/02/22 1649)  magnesium sulfate IVPB 2  g 50 mL (0 g Intravenous Stopped 10/02/22 1823)  furosemide (LASIX) injection 40 mg (40 mg Intravenous Given 10/02/22 1724)     ED Triage Vitals  Enc Vitals Group     BP 10/02/22 1634 (!) 146/112     Pulse Rate 10/02/22 1634 (!) 150     Resp 10/02/22 1634 (!) 29     Temp 10/02/22 1634 98.3 F (36.8 C)     Temp Source 10/02/22 1634 Oral     SpO2 10/02/22 1634 100 %     Weight 10/02/22 1634 93 lb 4.1 oz (42.3 kg)     Height 10/02/22 1634 5\' 4"  (1.626 m)     Head Circumference --      Peak Flow --      Pain Score 10/02/22 1638 4     Pain Loc --      Pain Edu? --      Excl. in GC? --   TMAX(24)@     _________________________________________ Significant initial  Findings: Abnormal Labs Reviewed  COMPREHENSIVE METABOLIC PANEL - Abnormal; Notable for the following components:      Result Value   Glucose, Bld 134 (*)    Calcium 8.4 (*)    All other components within normal limits  CBC WITH DIFFERENTIAL/PLATELET - Abnormal; Notable for the following components:   WBC 15.7 (*)    Neutro Abs 13.5 (*)    Monocytes Absolute 1.1 (*)    Abs Immature Granulocytes 0.08 (*)    All other components within normal limits  BRAIN NATRIURETIC PEPTIDE - Abnormal; Notable for the following components:   B Natriuretic Peptide 335.1 (*)    All other components within normal limits  BLOOD GAS, VENOUS - Abnormal; Notable for the following components:   pCO2, Ven 77 (*)    pO2, Ven <31 (*)    Bicarbonate 36.2 (*)    Acid-Base Excess 6.6 (*)    All other components within normal limits  BLOOD GAS, VENOUS - Abnormal; Notable for the following components:   pCO2, Ven 65 (*)    Bicarbonate 32.0 (*)    Acid-Base Excess 3.6 (*)    All other components within normal limits  CBG MONITORING, ED - Abnormal; Notable for the following components:   Glucose-Capillary 121 (*)    All other components within normal limits  TROPONIN I (HIGH SENSITIVITY) - Abnormal; Notable for the following components:   Troponin  I (High Sensitivity) 73 (*)  All other components within normal limits      _________________________ Troponin   Cardiac Panel (last 3 results) Recent Labs    10/02/22 1632  TROPONINIHS 73*     ECG: Ordered Personally reviewed and interpreted by me showing: HR : 120 Rhythm: Sinus tachycardia LAE, consider biatrial enlargement Probable RVH w/ secondary repol abnormality QTC 444  BNP (last 3 results) Recent Labs    10/02/22 1632  BNP 335.1*     COVID-19 Labs  No results for input(s): "DDIMER", "FERRITIN", "LDH", "CRP" in the last 72 hours.  Lab Results  Component Value Date   SARSCOV2NAA NEGATIVE 10/02/2022   SARSCOV2NAA NEGATIVE 09/20/2022   SARSCOV2NAA NEGATIVE 05/26/2021    The recent clinical data is shown below. Vitals:   10/02/22 1715 10/02/22 1730 10/02/22 1745 10/02/22 1806  BP: 113/81 101/73 121/86   Pulse: (!) 137 (!) 131 (!) 119 (!) 124  Resp: 20 (!) 25 (!) 22 19  Temp:      TempSrc:      SpO2: 100% 98% 98% 99%  Weight:      Height:          WBC     Component Value Date/Time   WBC 15.7 (H) 10/02/2022 1632   LYMPHSABS 1.0 10/02/2022 1632   MONOABS 1.1 (H) 10/02/2022 1632   EOSABS 0.1 10/02/2022 1632   BASOSABS 0.1 10/02/2022 1632      Lactic Acid, Venous    Component Value Date/Time   LATICACIDVEN 1.7 10/02/2022 1632     Procalcitonin   Ordered      UA ordered    Results for orders placed or performed during the hospital encounter of 10/02/22  SARS Coronavirus 2 by RT PCR (hospital order, performed in [facility] hospital lab) *cepheid single result test*     Status: None   Collection Time: 10/02/22  4:58 PM  Result Value Ref Range Status   SARS Coronavirus 2 by RT PCR NEGATIVE NEGATIVE Final          ABX started Antibiotics Given (last 72 hours)     Date/Time Action Medication Dose Rate   10/02/22 1722 New Bag/Given   vancomycin (VANCOCIN) IVPB 1000 mg/200 mL premix 1,000 mg 200 mL/hr   10/02/22 1724 New Bag/Given    ceFEPIme (MAXIPIME) 2 g in sodium chloride 0.9 % 100 mL IVPB 2 g 200 mL/hr       No results found for the last 90 days.     Venous  Blood Gas result:  pH   Latest Reference Range & Units Most Recent  pH, Arterial 7.35 - 7.45  7.223 (L) 09/20/22 20:31  pCO2 arterial 32 - 48 mmHg 69.2 (HH) 09/20/22 20:31  pO2, Arterial 83 - 108 mmHg 55 (L) 09/20/22 20:31  (HH): Data is critically high (L): Data is abnormally low ABG    Component Value Date/Time   PHART 7.223 (L) 09/20/2022 2031   PCO2ART 69.2 (HH) 09/20/2022 2031   PO2ART 55 (L) 09/20/2022 2031   HCO3 32.0 (H) 10/02/2022 1848   TCO2 30 09/20/2022 2031   ACIDBASEDEF 1.0 09/20/2022 2031   O2SAT 68.5 10/02/2022 1848     __________________________________________________________ Recent Labs  Lab 10/02/22 1632  NA 137  K 4.3  CO2 30  GLUCOSE 134*  BUN 11  CREATININE 1.04  CALCIUM 8.4*  MG 2.2    Cr   Up from baseline see below Lab Results  Component Value Date   CREATININE 1.04 10/02/2022   CREATININE 0.71 09/25/2022  CREATININE 0.59 (L) 09/23/2022    Recent Labs  Lab 10/02/22 1632  AST 17  ALT 18  ALKPHOS 46  BILITOT 0.3  PROT 6.7  ALBUMIN 3.8   Lab Results  Component Value Date   CALCIUM 8.4 (L) 10/02/2022   PHOS 4.3 09/21/2022        Plt: Lab Results  Component Value Date   PLT 290 10/02/2022       Recent Labs  Lab 10/02/22 1632  WBC 15.7*  NEUTROABS 13.5*  HGB 14.7  HCT 46.9  MCV 97.9  PLT 290    HG/HCT   stable,      Component Value Date/Time   HGB 14.7 10/02/2022 1632   HCT 46.9 10/02/2022 1632   MCV 97.9 10/02/2022 1632      _______________________________________________ Hospitalist was called for admission for    acute respiratory failure with hypoxia hypercarbia secondary to pneumonia versus fluid overload    The following Work up has been ordered so far:  Orders Placed This Encounter  Procedures   Blood Culture (routine x 2)   Urine Culture (for pregnant, neutropenic  or urologic patients or patients with an indwelling urinary catheter)   SARS Coronavirus 2 by RT PCR (hospital order, performed in [facility] hospital lab) *cepheid single result test*   DG Chest Port 1 View   Comprehensive metabolic panel   CBC with Differential   Protime-INR   APTT   Urinalysis, Routine w reflex microscopic -Urine, Clean Catch   Brain natriuretic peptide   Blood gas, venous (at [facility] and [facility])   Magnesium   Blood gas, venous (at [facility] and [facility])   Phosphorus   Prealbumin   Urinalysis, Complete w Microscopic -Urine, Clean Catch   TSH   D-dimer, quantitative   Diet NPO time specified   Cardiac monitoring   Document height and weight   Assess and Document Glasgow Coma Scale   Document vital signs within 1-hour of fluid bolus completion. Notify provider of abnormal vital signs despite fluid resuscitation.   DO NOT delay antibiotics if unable to obtain blood culture.   Refer to Sidebar Report: Sepsis Sidebar ED/IP   Notify provider for difficulties obtaining IV access.   Insert peripheral IV x 2   Initiate Carrier Fluid Protocol   Insert foley catheter   Cardiac Monitoring - Continuous Indefinite   Strict intake and output   Code Sepsis activation.  This occurs automatically when order is signed and prioritizes pharmacy, lab, and radiology services for STAT collections and interventions.  If [facility] downtime, call [facility] [phone]) to activate Code Sepsis.   Consult to hospitalist   Pulse oximetry, continuous   Bipap   CBG monitoring, ED   EKG 12-Lead   ED EKG 12-Lead   EKG 12-Lead   EKG 12-Lead   Admit to Inpatient (patient's expected length of stay will be greater than 2 midnights or inpatient only procedure)     OTHER Significant initial  Findings:  labs showing:  DM  labs:  HbA1C: No results for input(s): "HGBA1C" in the last 8760 hours.     CBG (last 3)  Recent Labs    10/02/22 1636  GLUCAP 121*    Cultures:    Component Value Date/Time   SDES   09/21/2022 0630    URINE, RANDOM Performed at [facility], [address], [place], [place] [address]    SPECREQUEST  09/21/2022 0630    NONE Reflexed from [id number] Performed at [facility], [address], [place], [place] [address]    CULT  09/21/2022 0630    NO GROWTH Performed at [facility], [address], [place], [place] [address]    REPTSTATUS 09/22/2022 FINAL 09/21/2022 0630     Radiological Exams on Admission: DG Chest Port 1 View  Result Date: 10/02/2022 CLINICAL DATA:  Sepsis EXAM: PORTABLE CHEST 1 VIEW COMPARISON:  09/25/2022 FINDINGS: Lung volumes are small. There is developing asymmetric bilateral airspace infiltrate, diffusely throughout the right lung and noted within the left lung base, asymmetric pulmonary edema versus infection. Small right pleural effusion suspected, new since prior exam. No pneumothorax. Cardiac size is stable and within normal limits given poor pulmonary insufflation. Moderate thoracolumbar levoscoliosis again noted with thoracolumbar fusion instrumentation again noted. No acute bone abnormality. Ventriculoperitoneal shunt catheter tubing overlies the right hemithorax. IMPRESSION: 1. Developing asymmetric bilateral airspace infiltrate, asymmetric pulmonary edema versus infection. 2. New small right pleural effusion. 3. Pulmonary hypoinflation. Electronically Signed   By: Helyn Numbers M.D.   On: 10/02/2022 16:59   _______________________________________________________________________________________________________ Latest  Blood pressure 121/86, pulse (!) 124, temperature 98.3 F (36.8 C), temperature source Oral, resp. rate 19, height 5\' 4"  (1.626 m), weight 42.3 kg, SpO2 99 %.   Vitals  labs and radiology finding personally reviewed  Review of Systems:    Pertinent positives include:  fatigue  shortness of breath at rest  dyspnea on exertion  Constitutional:  No weight loss, night sweats, Fevers,  chills, , weight loss  HEENT:  No headaches, Difficulty swallowing,Tooth/dental problems,Sore throat,  No sneezing, itching, ear ache, nasal congestion, post nasal drip,  Cardio-vascular:  No chest pain, Orthopnea, PND, anasarca, dizziness, palpitations.no Bilateral lower extremity swelling  GI:  No heartburn, indigestion, abdominal pain, nausea, vomiting, diarrhea, change in bowel habits, loss of appetite, melena, blood in stool, hematemesis Resp:  no, No excess mucus, no productive cough, No non-productive cough, No coughing up of blood.No change in color of mucus.No wheezing. Skin:  no rash or lesions. No jaundice GU:  no dysuria, change in color of urine, no urgency or frequency. No straining to urinate.  No flank pain.  Musculoskeletal:  No joint pain or no joint swelling. No decreased range of motion. No back pain.  Psych:  No change in mood or affect. No depression or anxiety. No memory loss.  Neuro: no localizing neurological complaints, no tingling, no weakness, no double vision, no gait abnormality, no slurred speech, no confusion  All systems reviewed and apart from HOPI all are negative _______________________________________________________________________________________________ Past Medical History:   Past Medical History:  Diagnosis Date   Spina bifida     History reviewed. No pertinent surgical history.  Social History:  Ambulatory  wheelchair bound,      reports that he has never smoked. He has never used smokeless tobacco. He reports that he does not currently use alcohol. No history on file for drug use.   Family History:   History reviewed. No pertinent family history. ______________________________________________________________________________________________ Allergies: Allergies  Allergen Reactions   Morphine And Related Other (See Comments)    Hallucinations    Septra [Sulfamethoxazole-Trimethoprim] Other (See Comments)    Gi upset     Sulfamethoxazole Other (See Comments)    Gi upset    Trimethoprim Other (See Comments)    Unknown    Latex Swelling and Rash     Prior to Admission medications   Medication Sig Start Date End Date Taking? Authorizing Provider  acetaminophen (TYLENOL) 325 MG  tablet Take 325 mg by mouth as needed for moderate pain.    [provider]  albuterol (PROVENTIL) (2.5 MG/3ML) 0.083% nebulizer solution Take 3 mLs (2.5 mg total) by nebulization every 6 (six) hours as needed for wheezing or shortness of breath. 09/26/22   Joseph Art, DO  cetirizine (ZYRTEC) 10 MG tablet Take 10 mg by mouth daily.    [provider]  Chlorpheniramine Maleate (ALLERGY RELIEF PO) Take by mouth as needed (congestion).    [provider]  doxycycline (VIBRA-TABS) 100 MG tablet Take 1 tablet (100 mg total) by mouth every 12 (twelve) hours. 09/26/22   Joseph Art, DO  ibuprofen (ADVIL) 200 MG tablet Take 200 mg by mouth as needed for moderate pain.    [provider]  Multiple Vitamins-Minerals (MULTIVITAMIN WITH MINERALS) tablet Take 1 tablet by mouth daily.    [provider]  oxybutynin (DITROPAN XL) 15 MG 24 hr tablet Take 15 mg by mouth daily.    [provider]  pantoprazole (PROTONIX) 40 MG tablet Take 1 tablet (40 mg total) by mouth 2 (two) times daily. 09/26/22   Joseph Art, DO  predniSONE (DELTASONE) 20 MG tablet Take 2 tablets (40 mg total) by mouth daily with breakfast. 09/26/22   Joseph Art, DO  VITAMIN D PO Take 1 capsule by mouth daily.    [provider]    ___________________________________________________________________________________________________ Physical Exam:    10/02/2022    6:06 PM 10/02/2022    5:45 PM 10/02/2022    5:30 PM  Vitals with BMI  Systolic  121 101  Diastolic  86 73  Pulse 124 119 131    1. General: increased work of breathing     Chronically ill   -appearing 2. Psychological: Alert and   Oriented 3.  Head/ENT:   Dry Mucous Membranes                          Head Non traumatic, neck supple                         Poor Dentition 4. SKIN:  decreased Skin turgor,  Skin clean Dry and intact no rash  5. Heart: Regular rate and rhythm no  Murmur, no Rub or gallop 6. Lungs: , no wheezes some crackles   7. Abdomen: Soft,  non-tender, Non distended bowel sounds present 8. Lower extremities: no clubbing, cyanosis, bilateral pitting edema 9. Neurologically paraplegic , chronic 10. MSK: Normal range of motion    Chart has been reviewed  ______________________________________________________________________________  Assessment/Plan 32 y.o. male with medical history significant of  Spina bifida, Paraplegia, Neurogenic bladder  Admitted for   acute respiratory failure with hypoxia hypercarbia secondary to pneumonia versus fluid overload    Present on Admission:  Acute respiratory failure with hypoxia and hypercapnia  CAP (community acquired pneumonia)  Elevated troponin     Acute exacerbation of CHF (congestive heart failure) No prior history of CHF but evident fluid overload. Repeat obtain echogram Diuresis as able If echogram abnormal will need cardiology consult  Acute respiratory failure with hypoxia and hypercapnia  this patient has acute respiratory failure with Hypoxia  Hypercarbia as documented by the presence of following: O2 saturatio< 90% on RA pH <7.35 with pCO2 >50   Likely due to:   Pneumonia, CHF exacerbation,   Provide O2 therapy and titrate as needed  Continuous pulse ox  check Pulse ox with ambulation prior to discharge   may need  TC consult for home O2 set up Hypercapnia appears to be compensated patient currently neurologically back to baseline no evidence of somnolence BiPAP as needed  flutter valve ordered   CAP (community acquired pneumonia) Unclear if recurrence of prior pneumonia versus just fluid Noted still elevated white blood cell count but patient has  been treated with steroids before. For now continue cefepime and Vanco obtain procalcitonin Check Legionella strep pneumo  Elevated troponin In the setting of hypoxia.  Continue cycle cardiac enzymes obtain echogram If abnormal workup will need cardiology consult  Spina bifida Chronic stable  Paraplegia Chronic stable    Other plan as per orders.  DVT prophylaxis:  SCD      Code Status:    Code Status: Prior FULL CODE as per patient  I had personally discussed CODE STATUS with patient     ACP none   Family Communication:   Family at  Bedside  plan of care was discussed  with step mother  Diet  Diet Orders (From admission, onward)     Start     Ordered   10/02/22 1936  Diet clear liquid Room service appropriate? Yes; Fluid consistency: Thin  Diet effective now       Question Answer Comment  Room service appropriate? Yes   Fluid consistency: Thin      10/02/22 1937            Disposition Plan:      To home once workup is complete and patient is stable   Following barriers for discharge:                                                        Will likely need home health, home O2, set up                           Will need consultants to evaluate patient prior to discharge      Consult Orders  (From admission, onward)           Start     Ordered   10/03/22 1934  Nutritional services consult  Once       Provider:  (Not yet assigned)  Question:  Reason for Consult?  Answer:  assess nutrtional status   10/02/22 1937   10/02/22 1934  SLP eval and treat Reason for evaluation: .Swallowing evaluation (BSE, MBS and/or diet order as indicated)  Once       Question:  Reason for evaluation  Answer:  .Swallowing evaluation (BSE, MBS and/or diet order as indicated)   10/02/22 1937   10/02/22 1934  Consult to respiratory care treatment (RT)  (COPD / Pneumonia / Cellulitis / Lower Extremity Wound)  Once       Provider:  (Not yet assigned)  Question:  Reason for  Consult?  Answer:  Evaluation   10/02/22 1937   10/02/22 1759  Consult to hospitalist  Once       Provider:  (Not yet assigned)  Question Answer Comment  Place call to: [facility]   Reason for Consult Admit      10/02/22 1758             Consults called: PCCM  is aware Emailed cardiology  Admission status:  ED Disposition     ED Disposition  Admit   Condition  --   Comment  Hospital Area: [facility] [[id number]]  Level of Care: Stepdown [14]  Admit to SDU based on following criteria: Respiratory Distress:  Frequent assessment and/or intervention to maintain adequate ventilation/respiration, pulmonary toilet, and respiratory treatment.  May admit patient to [facility] or [facility] if equivalent level of care is available:: No  Covid Evaluation: Confirmed COVID Negative  Diagnosis: Acute exacerbation of CHF (congestive heart failure) [[id number]]  Admitting Physician: Therisa Doyne [[id number]]  Attending Physician: Therisa Doyne [[id number]]  Certification:: I certify this patient will need inpatient services for at least 2 midnights  Estimated Length of Stay: 2           inpatient     I Expect 2 midnight stay secondary to severity of patient's current illness need for inpatient interventions justified by the following:  hemodynamic instability despite optimal treatment (tachycardia )  Severe lab/radiological/exam abnormalities including:    CAP and extensive comorbidities including:Spina Bifida  That are currently affecting medical management.   I expect  patient to be hospitalized for 2 midnights requiring inpatient medical care.  Patient is at high risk for adverse outcome (such as loss of life or disability) if not treated.  Indication for inpatient stay as follows:   New or worsening hypoxia   Need for IV antibiotics,  IV diuretics    Level of care  stepdown  tele indefinitely please discontinue once patient no longer  qualifies COVID-19 Labs    Lab Results  Component Value Date   SARSCOV2NAA NEGATIVE 10/02/2022     Precautions: admitted as   Covid Negative    Meagon Duskin 10/02/2022, 8:11 PM    [facility]     after 2 AM please page floor coverage PA If 7AM-7PM, please contact the day team taking care of the patient using [email or web address]

## 2022-10-02 NOTE — ED Provider Notes (Signed)
[place] EMERGENCY DEPARTMENT AT [facility] Provider Note   CSN: [id number] Arrival date & time: 10/02/22  1628     History {Add pertinent medical, surgical, social history, OB history to HPI:1} Chief Complaint  Patient presents with   Respiratory Distress    Samuel Becker is a 32 y.o. male.  Patient has a history of spina bifida.  He was discharged from the hospital about a week ago with pneumonia and mild fluid overload.  Patient began having significant shortness of breath today.  When he arrived to the emergency department he was on 100% oxygen nonrebreather and was having moderate to significant wheezing   Shortness of Breath      Home Medications Prior to Admission medications   Medication Sig Start Date End Date Taking? Authorizing Provider  acetaminophen (TYLENOL) 325 MG tablet Take 325 mg by mouth as needed for moderate pain.    [provider]  albuterol (PROVENTIL) (2.5 MG/3ML) 0.083% nebulizer solution Take 3 mLs (2.5 mg total) by nebulization every 6 (six) hours as needed for wheezing or shortness of breath. 09/26/22   Vitalia Stough Art, DO  cetirizine (ZYRTEC) 10 MG tablet Take 10 mg by mouth daily.    [provider]  Chlorpheniramine Maleate (ALLERGY RELIEF PO) Take by mouth as needed (congestion).    [provider]  doxycycline (VIBRA-TABS) 100 MG tablet Take 1 tablet (100 mg total) by mouth every 12 (twelve) hours. 09/26/22   Glenda Spelman Art, DO  ibuprofen (ADVIL) 200 MG tablet Take 200 mg by mouth as needed for moderate pain.    [provider]  Multiple Vitamins-Minerals (MULTIVITAMIN WITH MINERALS) tablet Take 1 tablet by mouth daily.    [provider]  oxybutynin (DITROPAN XL) 15 MG 24 hr tablet Take 15 mg by mouth daily.    [provider]  pantoprazole (PROTONIX) 40 MG tablet Take 1 tablet (40 mg total) by mouth 2 (two) times daily. 09/26/22   Rye Dorado Art, DO  predniSONE (DELTASONE)  20 MG tablet Take 2 tablets (40 mg total) by mouth daily with breakfast. 09/26/22   Nylen Creque Art, DO  VITAMIN D PO Take 1 capsule by mouth daily.    [provider]      Allergies    Morphine and related, Septra [sulfamethoxazole-trimethoprim], Sulfamethoxazole, Trimethoprim, and Latex    Review of Systems   Review of Systems  Respiratory:  Positive for shortness of breath.     Physical Exam Updated Vital Signs BP 121/86   Pulse (!) 124   Temp 98.3 F (36.8 C) (Oral)   Resp 19   Ht  (1.626 m)   Wt 42.3 kg   SpO2 99%   BMI 16.01 kg/m  Physical Exam  ED Results / Procedures / Treatments   Labs (all labs ordered are listed, but only abnormal results are displayed) Labs Reviewed  COMPREHENSIVE METABOLIC PANEL - Abnormal; Notable for the following components:      Result Value   Glucose, Bld 134 (*)    Calcium 8.4 (*)    All other components within normal limits  CBC WITH DIFFERENTIAL/PLATELET - Abnormal; Notable for the following components:   WBC 15.7 (*)    Neutro Abs 13.5 (*)    Monocytes Absolute 1.1 (*)    Abs Immature Granulocytes 0.08 (*)    All other components within normal limits  BRAIN NATRIURETIC PEPTIDE - Abnormal; Notable for the following components:   B Natriuretic Peptide 335.1 (*)  All other components within normal limits  BLOOD GAS, VENOUS - Abnormal; Notable for the following components:   pCO2, Ven 77 (*)    pO2, Ven <31 (*)    Bicarbonate 36.2 (*)    Acid-Base Excess 6.6 (*)    All other components within normal limits  CBG MONITORING, ED - Abnormal; Notable for the following components:   Glucose-Capillary 121 (*)    All other components within normal limits  TROPONIN I (HIGH SENSITIVITY) - Abnormal; Notable for the following components:   Troponin I (High Sensitivity) 73 (*)    All other components within normal limits  SARS CORONAVIRUS 2 BY RT PCR  CULTURE, BLOOD (ROUTINE X 2)  CULTURE, BLOOD (ROUTINE X 2)  URINE  CULTURE  LACTIC ACID, PLASMA  PROTIME-INR  APTT  MAGNESIUM  URINALYSIS, ROUTINE W REFLEX MICROSCOPIC  BLOOD GAS, VENOUS  PHOSPHORUS  PREALBUMIN  URINALYSIS, COMPLETE (UACMP) WITH MICROSCOPIC  TSH  D-DIMER, QUANTITATIVE  TROPONIN I (HIGH SENSITIVITY)    EKG EKG Interpretation  Date/Time:  Monday October 02 2022 17:59:11 EDT Ventricular Rate:  120 PR Interval:  116 QRS Duration: 80 QT Interval:  314 QTC Calculation: 444 R Axis:   163 Text Interpretation: Sinus tachycardia LAE, consider biatrial enlargement Probable RVH w/ secondary repol abnormality Confirmed by Bethann Berkshire [phone]) on 10/02/2022 6:22:04 PM  Radiology DG Chest Port 1 View  Result Date: 10/02/2022 CLINICAL DATA:  Sepsis EXAM: PORTABLE CHEST 1 VIEW COMPARISON:  09/25/2022 FINDINGS: Lung volumes are small. There is developing asymmetric bilateral airspace infiltrate, diffusely throughout the right lung and noted within the left lung base, asymmetric pulmonary edema versus infection. Small right pleural effusion suspected, new since prior exam. No pneumothorax. Cardiac size is stable and within normal limits given poor pulmonary insufflation. Moderate thoracolumbar levoscoliosis again noted with thoracolumbar fusion instrumentation again noted. No acute bone abnormality. Ventriculoperitoneal shunt catheter tubing overlies the right hemithorax. IMPRESSION: 1. Developing asymmetric bilateral airspace infiltrate, asymmetric pulmonary edema versus infection. 2. New small right pleural effusion. 3. Pulmonary hypoinflation. Electronically Signed   By: Helyn Numbers M.D.   On: 10/02/2022 16:59    Procedures Procedures  {Document cardiac monitor, telemetry assessment procedure when appropriate:1}  Medications Ordered in ED Medications  vancomycin (VANCOCIN) IVPB 1000 mg/200 mL premix (0 mg Intravenous Stopped 10/02/22 1822)  ceFEPIme (MAXIPIME) 2 g in sodium chloride 0.9 % 100 mL IVPB (0 g Intravenous Stopped 10/02/22 1754)   levalbuterol (XOPENEX) nebulizer solution 1.25 mg (1.25 mg Nebulization Given 10/02/22 1649)  ipratropium (ATROVENT) nebulizer solution 0.5 mg (0.5 mg Nebulization Given 10/02/22 1649)  albuterol (PROVENTIL) (2.5 MG/3ML) 0.083% nebulizer solution 2.5 mg (2.5 mg Nebulization Given 10/02/22 1649)  magnesium sulfate IVPB 2 g 50 mL (0 g Intravenous Stopped 10/02/22 1823)  furosemide (LASIX) injection 40 mg (40 mg Intravenous Given 10/02/22 1724)    ED Course/ Medical Decision Making/ A&P   {  CRITICAL CARE Performed by: Bethann Berkshire Total critical care time: 50 minutes Critical care time was exclusive of separately billable procedures and treating other patients. Critical care was necessary to treat or prevent imminent or life-threatening deterioration. Critical care was time spent personally by me on the following activities: development of treatment plan with patient and/or surrogate as well as nursing, discussions with consultants, evaluation of patient's response to treatment, examination of patient, obtaining history from patient or surrogate, ordering and performing treatments and interventions, ordering and review of laboratory studies, ordering and review of radiographic studies, pulse oximetry and re-evaluation of  patient's condition.    Sepsis protocol was initiated.  And also patient was given Xopenex and atropine.  Patient was placed on BiPAP.  Patient improved some.  His chest x-ray suggested possibly heart failure and his father said that his legs were getting bigger.  Patient was given 40 of Lasix IV and urinated at least 800 cc.  His shortness of breath improved significantly.  Patient was taken off the BiPAP and put on 6 L and did well clinically. Click here for ABCD2, HEART and other calculatorsREFRESH Note before signing :1}                          Medical Decision Making Amount and/or Complexity of Data Reviewed Labs: ordered. Radiology: ordered. ECG/medicine tests:  ordered.  Risk Prescription drug management. Decision regarding hospitalization.   Patient with hypoxia that has been significant.  Most likely related to congestive heart failure and possible residual pneumonia.  He is admitted to medicine  {Document critical care time when appropriate:1} {Document review of labs and clinical decision tools ie heart score, Chads2Vasc2 etc:1}  {Document your independent review of radiology images, and any outside records:1} {Document your discussion with family members, caretakers, and with consultants:1} {Document social determinants of health affecting pt's care:1} {Document your decision making why or why not admission, treatments were needed:1} Final Clinical Impression(s) / ED Diagnoses Final diagnoses:  Hypoxia  Systolic congestive heart failure, unspecified HF chronicity    Rx / DC Orders ED Discharge Orders     None

## 2022-10-02 NOTE — Assessment & Plan Note (Signed)
Chronic-stable.

## 2022-10-02 NOTE — Progress Notes (Signed)
RT NOTE:  Pt taken off BiPAP and placed on 6L [place] per EDP request. Pt saturations 95-96% at this time.

## 2022-10-02 NOTE — Assessment & Plan Note (Signed)
Unclear if recurrence of prior pneumonia versus just fluid Noted still elevated white blood cell count but patient has been treated with steroids before. For now continue cefepime and Vanco obtain procalcitonin Check Legionella strep pneumo

## 2022-10-02 NOTE — Assessment & Plan Note (Signed)
No prior history of CHF but evident fluid overload. Repeat obtain echogram Diuresis as able If echogram abnormal will need cardiology consult

## 2022-10-02 NOTE — Progress Notes (Signed)
Pharmacy Antibiotic Note  Samuel Becker is a 32 y.o. male admitted on 10/02/2022 with pneumonia.  Pharmacy has been consulted for Vancomycin & Cefepime dosing.  Plan: Cefepime 2 gm IV q8h Vancomycin 1 gm given @ 1722 in ED then Vancomycin 750 mg IV q24 for est AUC 447.4;  using SCr 1.04, TBW (TBW<IBW) Vd 0.5 F/u MRSA PCR, renal function, WBC, temp, cultures  Height: 5\' 4"  (162.6 cm) Weight: 42.3 kg (93 lb 4.1 oz) IBW/kg (Calculated) : 59.2  Temp (24hrs), Avg:98.3 F (36.8 C), Min:98.3 F (36.8 C), Max:98.3 F (36.8 C)  Recent Labs  Lab 10/02/22 1632  WBC 15.7*  CREATININE 1.04  LATICACIDVEN 1.7    Estimated Creatinine Clearance: 61 mL/min (by C-G formula based on SCr of 1.04 mg/dL).    Allergies  Allergen Reactions   Morphine And Related Other (See Comments)    Hallucinations    Septra [Sulfamethoxazole-Trimethoprim] Other (See Comments)    Gi upset    Sulfamethoxazole Other (See Comments)    Gi upset    Trimethoprim Other (See Comments)    Unknown    Latex Swelling and Rash    Antimicrobials this admission: 4/15 vanc> 4/15 cefepime>> Dose adjustments this admission:  Microbiology results: 4/15 BCx2:  4/15 RVP: PTA:  4/8 RVP neg 4/8 Ucx ngF 4/4 MRSA neg 4/3 BCx2 ngF   Thank you for allowing pharmacy to be a part of this patient's care.  Herby Abraham, Pharm.D Use secure chat for questions 10/02/2022 8:14 PM

## 2022-10-02 NOTE — Sepsis Progress Note (Signed)
Sepsis protocol is being followed by [facility]. 

## 2022-10-02 NOTE — Progress Notes (Signed)
A consult was received from an ED physician for vanc/cefepime per pharmacy dosing.  The patient's profile has been reviewed for ht/wt/allergies/indication/available labs.   A one time order has been placed for vanc 1g and cefepime 2g.  Further antibiotics/pharmacy consults should be ordered by admitting physician if indicated.                       Thank you, Berkley Harvey 10/02/2022  4:43 PM

## 2022-10-02 NOTE — Progress Notes (Signed)
RT NOTE:  Pt placed on BiPAP per EDP order.  

## 2022-10-02 NOTE — Subjective & Objective (Addendum)
Comes in with recurrent shortness of breath.  Sudden onset of respiratory distress EMS arrival satting in the 30s on arrival to emergency department started on BiPAP initial VBG showing pCO2 of 77 he was noted to be fluid overload on chest x-ray in ER and received a dose of Lasix after which he was able to come off of BiPAP and now on 6 L nasal cannula satting 95-96%   Patient has had recent admission for pneumonia when he started to have shortness of breath previous workup was negative for COVID influenza and patient initially required BiPAP and admitted for community-acquired pneumonia He was able to be weaned off of BiPAP after given some steroids repeat imaging on 7 April showed fluid overload and he was given Lasix for diuresis with improvement Fluid overload felt to be secondary to aggressive fluid management in the setting of sepsis Patient was discharged on 9 April  Patient never had an echo

## 2022-10-02 NOTE — Assessment & Plan Note (Signed)
In the setting of hypoxia.  Continue cycle cardiac enzymes obtain echogram If abnormal workup will need cardiology consult

## 2022-10-03 ENCOUNTER — Inpatient Hospital Stay (HOSPITAL_COMMUNITY): Payer: Medicare Other

## 2022-10-03 ENCOUNTER — Encounter (HOSPITAL_COMMUNITY): Payer: Self-pay | Admitting: Internal Medicine

## 2022-10-03 DIAGNOSIS — L8990 Pressure ulcer of unspecified site, unspecified stage: Secondary | ICD-10-CM | POA: Insufficient documentation

## 2022-10-03 DIAGNOSIS — J9601 Acute respiratory failure with hypoxia: Secondary | ICD-10-CM

## 2022-10-03 DIAGNOSIS — I420 Dilated cardiomyopathy: Secondary | ICD-10-CM

## 2022-10-03 DIAGNOSIS — I5021 Acute systolic (congestive) heart failure: Secondary | ICD-10-CM

## 2022-10-03 DIAGNOSIS — R7989 Other specified abnormal findings of blood chemistry: Secondary | ICD-10-CM

## 2022-10-03 DIAGNOSIS — I509 Heart failure, unspecified: Secondary | ICD-10-CM

## 2022-10-03 DIAGNOSIS — J9602 Acute respiratory failure with hypercapnia: Secondary | ICD-10-CM

## 2022-10-03 LAB — COMPREHENSIVE METABOLIC PANEL
ALT: 16 U/L (ref 0–44)
AST: 15 U/L (ref 15–41)
Albumin: 3.4 g/dL — ABNORMAL LOW (ref 3.5–5.0)
Alkaline Phosphatase: 40 U/L (ref 38–126)
Anion gap: 8 (ref 5–15)
BUN: 10 mg/dL (ref 6–20)
CO2: 31 mmol/L (ref 22–32)
Calcium: 8.2 mg/dL — ABNORMAL LOW (ref 8.9–10.3)
Chloride: 98 mmol/L (ref 98–111)
Creatinine, Ser: 0.81 mg/dL (ref 0.61–1.24)
GFR, Estimated: >60 mL/min (ref >60)
Glucose, Bld: 102 mg/dL — ABNORMAL HIGH (ref 70–99)
Potassium: 3.7 mmol/L (ref 3.5–5.1)
Sodium: 137 mmol/L (ref 135–145)
Total Bilirubin: 0.6 mg/dL (ref 0.3–1.2)
Total Protein: 6.1 g/dL — ABNORMAL LOW (ref 6.5–8.1)

## 2022-10-03 LAB — ECHOCARDIOGRAM COMPLETE
Area-P 1/2: 3.81 cm2
Calc EF: 40.2 %
Height: 64 in
S' Lateral: 3 cm
Single Plane A2C EF: 41.4 %
Single Plane A4C EF: 36.9 %
Weight: 1767.21 oz

## 2022-10-03 LAB — URINE CULTURE: Culture: NO GROWTH

## 2022-10-03 LAB — CBC
HCT: 42.2 % (ref 39.0–52.0)
Hemoglobin: 13.5 g/dL (ref 13.0–17.0)
MCH: 31 pg (ref 26.0–34.0)
MCHC: 32 g/dL (ref 30.0–36.0)
MCV: 97 fL (ref 80.0–100.0)
Platelets: 227 10*3/uL (ref 150–400)
RBC: 4.35 MIL/uL (ref 4.22–5.81)
RDW: 12.7 % (ref 11.5–15.5)
WBC: 13.7 10*3/uL — ABNORMAL HIGH (ref 4.0–10.5)
nRBC: 0 % (ref 0.0–0.2)

## 2022-10-03 LAB — RESPIRATORY PANEL BY PCR

## 2022-10-03 LAB — FERRITIN: Ferritin: 35 ng/mL (ref 24–336)

## 2022-10-03 LAB — TROPONIN I (HIGH SENSITIVITY)
Troponin I (High Sensitivity): 86 ng/L — ABNORMAL HIGH (ref <18)
Troponin I (High Sensitivity): 65 ng/L — ABNORMAL HIGH (ref <18)

## 2022-10-03 LAB — CREATININE, URINE, RANDOM: Creatinine, Urine: 391 mg/dL

## 2022-10-03 LAB — PHOSPHORUS: Phosphorus: 4.8 mg/dL — ABNORMAL HIGH (ref 2.5–4.6)

## 2022-10-03 LAB — MRSA NEXT GEN BY PCR, NASAL: MRSA by PCR Next Gen: NOT DETECTED

## 2022-10-03 LAB — MAGNESIUM: Magnesium: 2.5 mg/dL — ABNORMAL HIGH (ref 1.7–2.4)

## 2022-10-03 LAB — PREALBUMIN: Prealbumin: 18 mg/dL (ref 18–38)

## 2022-10-03 LAB — OSMOLALITY: Osmolality: 294 mOsm/kg (ref 275–295)

## 2022-10-03 LAB — OSMOLALITY, URINE: Osmolality, Ur: 766 mOsm/kg (ref 300–900)

## 2022-10-03 LAB — SODIUM, URINE, RANDOM: Sodium, Ur: 60 mmol/L

## 2022-10-03 LAB — STREP PNEUMONIAE URINARY ANTIGEN: Strep Pneumo Urinary Antigen: NEGATIVE

## 2022-10-03 MED ORDER — SODIUM CHLORIDE 0.9 % IV SOLN
1.0000 g | INTRAVENOUS | Status: DC
  Administered 2022-10-03: 1 g via INTRAVENOUS
  Filled 2022-10-03 (×3): qty 10

## 2022-10-03 MED ORDER — SENNOSIDES-DOCUSATE SODIUM 8.6-50 MG PO TABS: 1.0000 | ORAL_TABLET | Freq: Every evening | ORAL | Status: DC | PRN

## 2022-10-03 MED ORDER — ORAL CARE MOUTH RINSE: 15.0000 mL | OROMUCOSAL | Status: DC | PRN

## 2022-10-03 MED ORDER — DEXTROSE 5 % IV SOLN: 250.0000 mg | INTRAVENOUS | Status: DC

## 2022-10-03 MED ORDER — IPRATROPIUM-ALBUTEROL 0.5-2.5 (3) MG/3ML IN SOLN
3.0000 mL | RESPIRATORY_TRACT | Status: DC | PRN
  Administered 2022-10-03: 3 mL via RESPIRATORY_TRACT
  Filled 2022-10-03: qty 3

## 2022-10-03 MED ORDER — CHLORHEXIDINE GLUCONATE CLOTH 2 % EX PADS
6.0000 | MEDICATED_PAD | Freq: Every day | CUTANEOUS | Status: DC
  Administered 2022-10-03 – 2022-10-07 (×6): 6 via TOPICAL

## 2022-10-03 MED ORDER — FUROSEMIDE 10 MG/ML IJ SOLN
20.0000 mg | Freq: Once | INTRAMUSCULAR | Status: AC
  Administered 2022-10-03: 20 mg via INTRAVENOUS
  Filled 2022-10-03: qty 2

## 2022-10-03 MED ORDER — SODIUM CHLORIDE 0.9 % IV SOLN
500.0000 mg | INTRAVENOUS | Status: DC
  Administered 2022-10-03 – 2022-10-04 (×2): 500 mg via INTRAVENOUS
  Filled 2022-10-03 (×2): qty 5

## 2022-10-03 MED ORDER — METOPROLOL TARTRATE 5 MG/5ML IV SOLN
5.0000 mg | INTRAVENOUS | Status: DC | PRN
  Administered 2022-10-04: 5 mg via INTRAVENOUS
  Filled 2022-10-03: qty 5

## 2022-10-03 MED ORDER — FUROSEMIDE 10 MG/ML IJ SOLN
40.0000 mg | Freq: Two times a day (BID) | INTRAMUSCULAR | Status: DC
  Administered 2022-10-03 – 2022-10-05 (×5): 40 mg via INTRAVENOUS
  Filled 2022-10-03 (×5): qty 4

## 2022-10-03 MED ORDER — ADULT MULTIVITAMIN W/MINERALS CH
1.0000 | ORAL_TABLET | Freq: Every day | ORAL | Status: DC
  Administered 2022-10-03 – 2022-10-07 (×5): 1 via ORAL
  Filled 2022-10-03 (×5): qty 1

## 2022-10-03 MED ORDER — PANTOPRAZOLE SODIUM 40 MG PO TBEC
40.0000 mg | DELAYED_RELEASE_TABLET | Freq: Two times a day (BID) | ORAL | Status: DC
  Administered 2022-10-03 – 2022-10-07 (×9): 40 mg via ORAL
  Filled 2022-10-03 (×9): qty 1

## 2022-10-03 MED ORDER — IPRATROPIUM-ALBUTEROL 0.5-2.5 (3) MG/3ML IN SOLN
3.0000 mL | Freq: Two times a day (BID) | RESPIRATORY_TRACT | Status: DC
  Administered 2022-10-03 – 2022-10-07 (×9): 3 mL via RESPIRATORY_TRACT
  Filled 2022-10-03 (×9): qty 3

## 2022-10-03 MED ORDER — DOCUSATE SODIUM 100 MG PO CAPS
100.0000 mg | ORAL_CAPSULE | Freq: Two times a day (BID) | ORAL | Status: DC
  Administered 2022-10-03 – 2022-10-07 (×2): 100 mg via ORAL
  Filled 2022-10-03 (×3): qty 1

## 2022-10-03 MED ORDER — LORATADINE 10 MG PO TABS
10.0000 mg | ORAL_TABLET | Freq: Every day | ORAL | Status: DC
  Administered 2022-10-03 – 2022-10-07 (×4): 10 mg via ORAL
  Filled 2022-10-03 (×4): qty 1

## 2022-10-03 MED ORDER — TRAZODONE HCL 50 MG PO TABS: 50.0000 mg | ORAL_TABLET | Freq: Every evening | ORAL | Status: DC | PRN

## 2022-10-03 MED ORDER — CHOLECALCIFEROL 10 MCG (400 UNIT) PO TABS
400.0000 [IU] | ORAL_TABLET | Freq: Every day | ORAL | Status: DC
  Administered 2022-10-03 – 2022-10-07 (×5): 400 [IU] via ORAL
  Filled 2022-10-03 (×5): qty 1

## 2022-10-03 MED ORDER — OXYBUTYNIN CHLORIDE ER 5 MG PO TB24
15.0000 mg | ORAL_TABLET | Freq: Every day | ORAL | Status: DC
  Administered 2022-10-03 – 2022-10-06 (×4): 15 mg via ORAL
  Filled 2022-10-03 (×3): qty 1
  Filled 2022-10-03: qty 3

## 2022-10-03 MED ORDER — ENSURE ENLIVE PO LIQD
237.0000 mL | Freq: Two times a day (BID) | ORAL | Status: DC
  Administered 2022-10-03 – 2022-10-07 (×4): 237 mL via ORAL

## 2022-10-03 MED ORDER — MULTI-VITAMIN/MINERALS PO TABS: 1.0000 | ORAL_TABLET | Freq: Every day | ORAL | Status: DC

## 2022-10-03 MED ORDER — GUAIFENESIN 100 MG/5ML PO LIQD: 5.0000 mL | ORAL | Status: DC | PRN

## 2022-10-03 MED ORDER — PNEUMOCOCCAL 20-VAL CONJ VACC 0.5 ML IM SUSY: 0.5000 mL | PREFILLED_SYRINGE | INTRAMUSCULAR | Status: DC | PRN

## 2022-10-03 MED ORDER — HYDRALAZINE HCL 20 MG/ML IJ SOLN: 10.0000 mg | INTRAMUSCULAR | Status: DC | PRN

## 2022-10-03 MED ORDER — METHYLPREDNISOLONE SODIUM SUCC 40 MG IJ SOLR
40.0000 mg | Freq: Two times a day (BID) | INTRAMUSCULAR | Status: DC
  Administered 2022-10-03 – 2022-10-04 (×2): 40 mg via INTRAVENOUS
  Filled 2022-10-03 (×2): qty 1

## 2022-10-03 NOTE — Plan of Care (Signed)
  Problem: Education: Goal: Ability to demonstrate management of disease process will improve Outcome: Progressing   Problem: Activity: Goal: Capacity to carry out activities will improve Outcome: Progressing   Problem: Activity: Goal: Ability to tolerate increased activity will improve Outcome: Progressing   Problem: Education: Goal: Knowledge of General Education information will improve Description: Including pain rating scale, medication(s)/side effects and non-pharmacologic comfort measures Outcome: Progressing   Problem: Health Behavior/Discharge Planning: Goal: Ability to manage health-related needs will improve Outcome: Progressing   Problem: Elimination: Goal: Will not experience complications related to bowel motility Outcome: Progressing Goal: Will not experience complications related to urinary retention Outcome: Progressing   Problem: Cardiac: Goal: Ability to achieve and maintain adequate cardiopulmonary perfusion will improve Outcome: Not Progressing

## 2022-10-03 NOTE — TOC Initial Note (Signed)
Transition of Care [facility]) - Initial/Assessment Note    Patient Details  Name: Samuel Becker MRN: [id number] Date of Birth: 04-16-1991  Transition of Care [facility] CM/SW Contact:    Lavenia Atlas, RN Phone Number: 10/03/2022, 6:05 PM  Clinical Narrative:     Per chart review patient resides with parents, PMH spina bifida and self caths at home Q4 hours while awake, functional wheelchair dependent. Received TOC consult for HF screening, per chart review patient does not meet CHF protocol.               TOC will continue to follow for needs.  Expected Discharge Plan: Home/Self Care Barriers to Discharge: Continued Medical Work up   Patient Goals and CMS Choice Patient states their goals for this hospitalization and ongoing recovery are:: return home CMS Medicare.gov Compare Post Acute Care list provided to:: Patient Choice offered to / list presented to : Patient [place] ownership interest in [facility].provided to:: Patient    Expected Discharge Plan and Services   Discharge Planning Services: CM Consult   Living arrangements for the past 2 months: Single Family Home                                      Prior Living Arrangements/Services Living arrangements for the past 2 months: Single Family Home Lives with:: Parents                   Activities of Daily Living Home Assistive Devices/Equipment: Wheelchair ADL Screening (condition at time of admission) Patient's cognitive ability adequate to safely complete daily activities?: Yes Is the patient deaf or have difficulty hearing?: No Does the patient have difficulty seeing, even when wearing glasses/contacts?: No Does the patient have difficulty concentrating, remembering, or making decisions?: No Patient able to express need for assistance with ADLs?: Yes Does the patient have difficulty dressing or bathing?: Yes Independently performs ADLs?: No Does the patient have difficulty walking or  climbing stairs?: Yes Weakness of Legs: Both Weakness of Arms/Hands: Both  Permission Sought/Granted                  Emotional Assessment Appearance:: Appears stated age         Psych Involvement: No (comment)  Admission diagnosis:  Hypoxia [R09.02] Acute exacerbation of CHF (congestive heart failure) [I50.9] Systolic congestive heart failure, unspecified HF chronicity [I50.20] Patient Active Problem List   Diagnosis Date Noted   Pressure injury of skin 10/03/2022   Dilated cardiomyopathy 10/03/2022   Acute systolic CHF (congestive heart failure) 10/03/2022   Acute exacerbation of CHF (congestive heart failure) 10/02/2022   CAP (community acquired pneumonia) 10/02/2022   Elevated troponin 10/02/2022   Spina bifida 10/02/2022   Paraplegia 10/02/2022   Acute respiratory failure with hypoxemia 09/20/2022   PCP:  Moshe Cipro, NP Pharmacy:   [id number] [place], [place] - [address] AT [facility] [address] & [facility] [address] [place] [address] Phone: [phone] Fax: [phone]     Social Determinants of Health (SDOH) Social History: SDOH Screenings   Food Insecurity: No Food Insecurity (10/03/2022)  Housing: Low Risk  (10/03/2022)  Transportation Needs: No Transportation Needs (10/03/2022)  Utilities: Not At Risk (10/03/2022)  Tobacco Use: Low Risk  (10/03/2022)   SDOH Interventions:     Readmission Risk Interventions    10/03/2022    6:02 PM  09/26/2022    9:25 AM 09/21/2022    3:22 PM  Readmission Risk Prevention Plan  Post Dischage Appt Complete Complete Complete  Medication Screening Complete Complete Complete  Transportation Screening Complete Complete Complete

## 2022-10-03 NOTE — Progress Notes (Signed)
Initial Nutrition Assessment  DOCUMENTATION CODES:   Not applicable  INTERVENTION:  - Regular diet.  - Ensure Plus High Protein po BID, each supplement provides 350 kcal and 20 grams of protein. - Encourage intake at all meals.  - Continue daily Multivitamin with minerals. Continue home vitamin D. - Monitor weight trends.    NUTRITION DIAGNOSIS:   Increased nutrient needs related to acute illness as evidenced by estimated needs.  GOAL:   Patient will meet greater than or equal to 90% of their needs  MONITOR:   PO intake, Supplement acceptance, Labs, Weight trends  REASON FOR ASSESSMENT:   Consult Assessment of nutrition requirement/status  ASSESSMENT:   32 y.o. male with medical history significant of  Spina bifida, Paraplegia, Neurogenic bladder presenting shortness of breath, found to have acute CHF exacerbation.  Met with patient and family at bedside this afternoon. Patient endorses a UBW of 95# and denied any recent changes in weight. Admit weight noted to be 110# however patient admitted with CHF and noted to have deep pitting edema, which Is likely skewing current weight.   Patient reports eating very well at home. Eats 3 meals a day or more. Has previously consumed Ensure when he was very underweight several years ago (per parents was at 39#) but has not consumed any recently.   Patient advanced to a regular diet this morning. Endorses having had an omelet for breakfast. Appetite a little decreased from normal but still good. Encouraged patient to aim to consume 3 meals a day to help prevent weight loss during admit. He is agreeable to receive Ensure to help support intake.    Medications reviewed and include: 400 units vitamin D, Colace, Lasix, MVI  Labs reviewed:  Phosphorus 4.8 Magneisum 2.5   NUTRITION - FOCUSED PHYSICAL EXAM:  Flowsheet Row Most Recent Value  Orbital Region No depletion  Upper Arm Region Mild depletion  Thoracic and Lumbar Region  Mild depletion  Buccal Region No depletion  Temple Region No depletion  Clavicle Bone Region No depletion  Clavicle and Acromion Bone Region No depletion  Scapular Bone Region Unable to assess  Dorsal Hand No depletion  Patellar Region Severe depletion  [paraplegic, wheelchair bound]  Anterior Thigh Region Severe depletion  [paraplegic, wheelchair bound]  Posterior Calf Region Severe depletion  [paraplegic, wheelchair bound]  Edema (RD Assessment) Moderate  Hair Reviewed  Eyes Reviewed  Mouth Reviewed  Skin Reviewed  Nails Reviewed       Diet Order:   Diet Order             Diet regular Room service appropriate? Yes; Fluid consistency: Thin  Diet effective now                   EDUCATION NEEDS:  Education needs have been addressed  Skin:  Skin Assessment: Skin Integrity Issues: Skin Integrity Issues:: Stage II Stage II: Mid vertebral column  Last BM:  PTA  Height:  Ht Readings from Last 1 Encounters:  10/03/22  (1.626 m)   Weight:  Wt Readings from Last 1 Encounters:  10/03/22 50.1 kg  *Weight used for calculations: 42.3kg (initial admit wt, suspect more accurate)  BMI:  Body mass index is 18.96 kg/m.  Estimated Nutritional Needs:  Kcal:  1500-1700 kcals Protein:  60-70 grams Fluid:  >/= 1.5L    Shelle Iron RD, LDN For contact information, refer to [facility].

## 2022-10-03 NOTE — Progress Notes (Signed)
PROGRESS NOTE    Samuel Becker  ZOX:[id number] DOB: March 20, 1991 DOA: 10/02/2022 PCP: Moshe Cipro, NP   Brief Narrative:  32 year old with history of spina bifida, paraplegia, neurogenic bladder, GERD comes to the hospital for shortness of breath/hypoxic/hypercarbic respiratory failure requiring BiPAP.  Upon admission there was concerns of fluid overload versus community-acquired pneumonia.  Patient was recently discharged on April 9 after being treated for sepsis complicated by fluid overload.  Chest x-ray showed asymmetric bilateral infiltrate.  Started on vancomycin and cefepime.  Antibiotics de-escalated, started on Lasix.   Assessment & Plan:  Principal Problem:   Acute exacerbation of CHF (congestive heart failure) Active Problems:   Acute respiratory failure with hypoxia and hypercapnia   CAP (community acquired pneumonia)   Elevated troponin   Spina bifida   Paraplegia    Acute respiratory failure with hypoxia/hypercapnia -Multifactorial in the setting of volume overload and possible underlying community-acquired pneumonia.  Respiratory panel, COVID negative.  Cardiology and pulmonary following Significant wheezing which I think is cardiac but will start patient on Solu-Medrol.  Give additional dose of Lasix later today -BiPAP has been weaned down to nasal cannula.   -Out of bed to chair -Procalcitonin, BNP  Volume overload Elevated troponin, demand ischemia - No prior echo.  Order echocardiogram.  Diuresis with IV Lasix.  Lasix today.  Fluid restriction.  Monitor and replete electrolytes as needed.  Troponins have trended down  Community-acquired pneumonia - Check procalcitonin, MRSA swab negative, DC vancomycin.  Leukocytosis improving, D-dimer negative - Transition cefepime to Rocephin/azithromycin - Strep pneumo urine antigen-negative -Urine Legionella-pending  Spina bifida Paraplegia with neurogenic bladder - Stable.  Home oxybutynin  GERD -  PPI  Chronic constipation - Enemas at home.  Will place him on bowel regimen    DVT prophylaxis: SCDs Code Status: Full code Family Communication: Entire family at bedside Status is: Inpatient Remains inpatient appropriate because: Continue hospital stay for shortness of breath   Pressure Injury 10/03/22 Vertebral column Lower;Mid Stage 2 -  Partial thickness loss of dermis presenting as a shallow open injury with a red, pink wound bed without slough. Pressure injury, advanced stage of healing (Active)  10/03/22 0045  Location: Vertebral column  Location Orientation: Lower;Mid  Staging: Stage 2 -  Partial thickness loss of dermis presenting as a shallow open injury with a red, pink wound bed without slough.  Wound Description (Comments): Pressure injury, advanced stage of healing  Present on Admission: Yes     Diet Orders (From admission, onward)     Start     Ordered   10/02/22 1936  Diet clear liquid Room service appropriate? Yes; Fluid consistency: Thin  Diet effective now       Question Answer Comment  Room service appropriate? Yes   Fluid consistency: Thin      10/02/22 1937            Subjective: Patient feels a little better compared to yesterday evening but still get short of breath with long sentences.    Examination:  General exam: Appears calm and comfortable, 4 L nasal cannula Respiratory system: Significant diffuse bilateral crackles with expiratory wheezing Cardiovascular system: S1 & S2 heard, RRR. No JVD, murmurs, rubs, gallops or clicks. No pedal edema. Gastrointestinal system: Abdomen is nondistended, soft and nontender. No organomegaly or masses felt. Normal bowel sounds heard. Central nervous system: Alert and oriented. No focal neurological deficits. Extremities: Symmetric 5 x 5 power. Skin: No rashes, lesions or ulcers Psychiatry: Judgement and insight appear  normal. Mood & affect appropriate.  Objective: Vitals:   10/03/22 0435 10/03/22  0500 10/03/22 0510 10/03/22 0600  BP:   (!) 100/58 (!) 116/93  Pulse: 96 98 95 (!) 103  Resp: 13 15 (!) 28 16  Temp:      TempSrc:      SpO2: 97% 98% 99% 98%  Weight: 50.1 kg     Height:        Intake/Output Summary (Last 24 hours) at 10/03/2022 0717 Last data filed at 10/03/2022 0600 Gross per 24 hour  Intake 118.35 ml  Output 1000 ml  Net -881.65 ml   Filed Weights   10/03/22 0025 10/03/22 0030 10/03/22 0435  Weight: 50.1 kg 50.1 kg 50.1 kg    Scheduled Meds:  Chlorhexidine Gluconate Cloth  6 each Topical Daily   furosemide  40 mg Intravenous Daily   sodium chloride flush  3 mL Intravenous Q12H   Continuous Infusions:  sodium chloride 5 mL/hr at 10/03/22 0600   ceFEPime (MAXIPIME) IV Stopped (10/03/22 0220)   vancomycin      Nutritional status     Body mass index is 18.96 kg/m.  Data Reviewed:   CBC: Recent Labs  Lab 10/02/22 1632 10/03/22 0311  WBC 15.7* 13.7*  NEUTROABS 13.5*  --   HGB 14.7 13.5  HCT 46.9 42.2  MCV 97.9 97.0  PLT 290 227   Basic Metabolic Panel: Recent Labs  Lab 10/02/22 1632 10/02/22 2015 10/03/22 0311  NA 137  --  137  K 4.3  --  3.7  CL 100  --  98  CO2 30  --  31  GLUCOSE 134*  --  102*  BUN 11  --  10  CREATININE 1.04  --  0.81  CALCIUM 8.4*  --  8.2*  MG 2.2  --  2.5*  PHOS  --  4.7* 4.8*   GFR: Estimated Creatinine Clearance: 92.8 mL/min (by C-G formula based on SCr of 0.81 mg/dL). Liver Function Tests: Recent Labs  Lab 10/02/22 1632 10/03/22 0311  AST 17 15  ALT 18 16  ALKPHOS 46 40  BILITOT 0.3 0.6  PROT 6.7 6.1*  ALBUMIN 3.8 3.4*   No results for input(s): "LIPASE", "AMYLASE" in the last 168 hours. No results for input(s): "AMMONIA" in the last 168 hours. Coagulation Profile: Recent Labs  Lab 10/02/22 1632  INR 1.1   Cardiac Enzymes: No results for input(s): "CKTOTAL", "CKMB", "CKMBINDEX", "TROPONINI" in the last 168 hours. BNP (last 3 results) No results for input(s): "PROBNP" in the last  8760 hours. HbA1C: No results for input(s): "HGBA1C" in the last 72 hours. CBG: Recent Labs  Lab 10/02/22 1636  GLUCAP 121*   Lipid Profile: No results for input(s): "CHOL", "HDL", "LDLCALC", "TRIG", "CHOLHDL", "LDLDIRECT" in the last 72 hours. Thyroid Function Tests: Recent Labs    10/02/22 1632  TSH 0.704   Anemia Panel: No results for input(s): "VITAMINB12", "FOLATE", "FERRITIN", "TIBC", "IRON", "RETICCTPCT" in the last 72 hours. Sepsis Labs: Recent Labs  Lab 10/02/22 1632 10/02/22 2015  PROCALCITON  --  0.10  LATICACIDVEN 1.7  --     Recent Results (from the past 240 hour(s))  Respiratory (~20 pathogens) panel by PCR     Status: None   Collection Time: 09/25/22  9:40 AM   Specimen: Nasopharyngeal Swab; Respiratory  Result Value Ref Range Status   Adenovirus NOT DETECTED NOT DETECTED Final   Coronavirus 229E NOT DETECTED NOT DETECTED Final    Comment: (NOTE) The  Coronavirus on the Respiratory Panel, DOES NOT test for the novel  Coronavirus (2019 nCoV)    Coronavirus HKU1 NOT DETECTED NOT DETECTED Final   Coronavirus NL63 NOT DETECTED NOT DETECTED Final   Coronavirus OC43 NOT DETECTED NOT DETECTED Final   Metapneumovirus NOT DETECTED NOT DETECTED Final   Rhinovirus / Enterovirus NOT DETECTED NOT DETECTED Final   Influenza A NOT DETECTED NOT DETECTED Final   Influenza B NOT DETECTED NOT DETECTED Final   Parainfluenza Virus 1 NOT DETECTED NOT DETECTED Final   Parainfluenza Virus 2 NOT DETECTED NOT DETECTED Final   Parainfluenza Virus 3 NOT DETECTED NOT DETECTED Final   Parainfluenza Virus 4 NOT DETECTED NOT DETECTED Final   Respiratory Syncytial Virus NOT DETECTED NOT DETECTED Final   Bordetella pertussis NOT DETECTED NOT DETECTED Final   Bordetella Parapertussis NOT DETECTED NOT DETECTED Final   Chlamydophila pneumoniae NOT DETECTED NOT DETECTED Final   Mycoplasma pneumoniae NOT DETECTED NOT DETECTED Final    Comment: Performed at [facility], [address] [place], [place] [address]  SARS Coronavirus 2 by RT PCR (hospital order, performed in [facility] hospital lab) *cepheid single result test*     Status: None   Collection Time: 10/02/22  4:58 PM  Result Value Ref Range Status   SARS Coronavirus 2 by RT PCR NEGATIVE NEGATIVE Final    Comment: (NOTE) SARS-CoV-2 target nucleic acids are NOT DETECTED.  The SARS-CoV-2 RNA is generally detectable in upper and lower respiratory specimens during the acute phase of infection. The lowest concentration of SARS-CoV-2 viral copies this assay can detect is 250 copies / mL. A negative result does not preclude SARS-CoV-2 infection and should not be used as the sole basis for treatment or other patient management decisions.  A negative result may occur with improper specimen collection / handling, submission of specimen other than nasopharyngeal swab, presence of viral mutation(s) within the areas targeted by this assay, and inadequate number of viral copies (<250 copies / mL). A negative result must be combined with clinical observations, patient history, and epidemiological information.  Fact Sheet for Patients:   RoadLapTop.co.za  Fact Sheet for Healthcare Providers: http://kim-miller.com/  This test is not yet approved or  cleared by the [place] FDA and has been authorized for detection and/or diagnosis of SARS-CoV-2 by FDA under an Emergency Use Authorization (EUA).  This EUA will remain in effect (meaning this test can be used) for the duration of the COVID-19 declaration under Section 564(b)(1) of the Act, 21 U.S.C. section 360bbb-3(b)(1), unless the authorization is terminated or revoked sooner.  Performed at [facility], [address], [place], [place] [address]   Respiratory (~20 pathogens) panel by PCR     Status: None   Collection Time: 10/02/22  4:58 PM   Specimen: Urine, Clean Catch; Respiratory  Result  Value Ref Range Status   Adenovirus NOT DETECTED NOT DETECTED Final   Coronavirus 229E NOT DETECTED NOT DETECTED Final    Comment: (NOTE) The Coronavirus on the Respiratory Panel, DOES NOT test for the novel  Coronavirus (2019 nCoV)    Coronavirus HKU1 NOT DETECTED NOT DETECTED Final   Coronavirus NL63 NOT DETECTED NOT DETECTED Final   Coronavirus OC43 NOT DETECTED NOT DETECTED Final   Metapneumovirus NOT DETECTED NOT DETECTED Final   Rhinovirus / Enterovirus NOT DETECTED NOT DETECTED Final   Influenza A NOT DETECTED NOT DETECTED Final   Influenza B NOT DETECTED NOT DETECTED Final   Parainfluenza Virus  1 NOT DETECTED NOT DETECTED Final   Parainfluenza Virus 2 NOT DETECTED NOT DETECTED Final   Parainfluenza Virus 3 NOT DETECTED NOT DETECTED Final   Parainfluenza Virus 4 NOT DETECTED NOT DETECTED Final   Respiratory Syncytial Virus NOT DETECTED NOT DETECTED Final   Bordetella pertussis NOT DETECTED NOT DETECTED Final   Bordetella Parapertussis NOT DETECTED NOT DETECTED Final   Chlamydophila pneumoniae NOT DETECTED NOT DETECTED Final   Mycoplasma pneumoniae NOT DETECTED NOT DETECTED Final    Comment: Performed at [facility], [address] [place], [place] [address]  MRSA Next Gen by PCR, Nasal     Status: None   Collection Time: 10/02/22 11:05 PM   Specimen: Nasal Mucosa; Nasal Swab  Result Value Ref Range Status   MRSA by PCR Next Gen NOT DETECTED NOT DETECTED Final    Comment: (NOTE) The GeneXpert MRSA Assay (FDA approved for NASAL specimens only), is one component of a comprehensive MRSA colonization surveillance program. It is not intended to diagnose MRSA infection nor to guide or monitor treatment for MRSA infections. Test performance is not FDA approved in patients less than 78 years old. Performed at [facility], [address], [place], [place] [address]          Radiology Studies: [facility] Chest Port 1 View  Result Date:  10/02/2022 CLINICAL DATA:  Sepsis EXAM: PORTABLE CHEST 1 VIEW COMPARISON:  09/25/2022 FINDINGS: Lung volumes are small. There is developing asymmetric bilateral airspace infiltrate, diffusely throughout the right lung and noted within the left lung base, asymmetric pulmonary edema versus infection. Small right pleural effusion suspected, new since prior exam. No pneumothorax. Cardiac size is stable and within normal limits given poor pulmonary insufflation. Moderate thoracolumbar levoscoliosis again noted with thoracolumbar fusion instrumentation again noted. No acute bone abnormality. Ventriculoperitoneal shunt catheter tubing overlies the right hemithorax. IMPRESSION: 1. Developing asymmetric bilateral airspace infiltrate, asymmetric pulmonary edema versus infection. 2. New small right pleural effusion. 3. Pulmonary hypoinflation. Electronically Signed   By: Helyn Numbers M.D.   On: 10/02/2022 16:59           LOS: 1 day   Time spent= 35 mins    Chikita Dogan Joline Maxcy, MD [facility]  If 7PM-7AM, please contact night-coverage  10/03/2022, 7:17 AM

## 2022-10-03 NOTE — Consult Note (Signed)
NAME:  Samuel Becker, MRN:  [id number], DOB:  [birth year], LOS: 1 ADMISSION DATE:  10/02/2022, CONSULTATION DATE: 10/03/2022 REFERRING MD: Dr. Nelson Chimes, CHIEF COMPLAINT: Hypoxic respiratory failure   History of Present Illness:  asked to see patient for hypoxemic respiratory failure  He was recently in the hospital treated for pneumonia spent about 5 days in the hospital went home feeling a little bit better but did not get back to baseline  Was brought into the hospital secondary to having progressive shortness of breath, cyanosis of extremities  Was given nebulization treatments, placed on oxygen supplementation, BiPAP following initial evaluation -Was given diuretics and now feels a little bit better Does not have a history of respiratory problems No history of cardiac problems History of spina bifida, very functional-wheelchair dependent  Pertinent  Medical History   Past Medical History:  Diagnosis Date   Spina bifida    Significant Hospital Events: Including procedures, antibiotic start and stop dates in addition to other pertinent events   ABG 7.22/69/55 4/15 chest x-ray with bilateral infiltrate, concern for infection versus congestion Interim History / Subjective:  Awake alert interactive, on oxygen supplementation  Objective   Blood pressure (!) 116/93, pulse (!) 103, temperature 99 F (37.2 C), temperature source Oral, resp. rate 16, height  (1.626 m), weight 50.1 kg, SpO2 99 %.    FiO2 (%):  [100 %] 100 %   Intake/Output Summary (Last 24 hours) at 10/03/2022 0957 Last data filed at 10/03/2022 0600 Gross per 24 hour  Intake 118.35 ml  Output 1000 ml  Net -881.65 ml   Filed Weights   10/03/22 0025 10/03/22 0030 10/03/22 0435  Weight: 50.1 kg 50.1 kg 50.1 kg    Examination: General: Young gentleman, does not appear to be in distress HENT: Moist oral mucosa Lungs: Decreased breath sounds with some coarse sounds at the bases Cardiovascular: S1-S2  appreciated Abdomen: Soft, bowel sounds appreciated Extremities: No clubbing, does have some edema Neuro: Awake alert interactive, moving upper extremities well GU:   Resolved Hospital Problem list     Assessment & Plan:  Hypoxemic respiratory failure Recent pneumonia Respiratory viral panel negative -Will discontinue isolation -Continue oxygen supplementation to keep saturations above 90 -We do not need to continue BiPAP at present -Bronchodilators  Procalcitonin is normal, elevated white count Concern for right lower lobe pneumonia -Follow-up on strep pneumo, Legionella -Follow-up on cultures -Empirically continue antibiotics at the present time with plans to wean quickly if no ongoing signs of infection  Possible pulmonary edema -Received Lasix -Will obtain echocardiogram -No underlying heart disease  Spina bifida -Stable  Neurogenic bladder -On oxybutynin Self-catheterization  Chronic constipation -Minimize  Patient may have chronic hypercapnic respiratory failure from a restrictive physiology As not had multiple hospitalizations likelihood of significant sleep apnea is low May require further evaluation, PFTs to qualify for nocturnal NIPPV  Best Practice (right click and "Reselect all SmartList Selections" daily)   Diet/type: Regular consistency (see orders) DVT prophylaxis: LMWH GI prophylaxis: N/A Lines: N/A Foley:  N/A Code Status:  full code Last date of multidisciplinary goals of care discussion [discussed with patient's parents at bedside]  Labs   CBC: Recent Labs  Lab 10/02/22 1632 10/03/22 0311  WBC 15.7* 13.7*  NEUTROABS 13.5*  --   HGB 14.7 13.5  HCT 46.9 42.2  MCV 97.9 97.0  PLT 290 227    Basic Metabolic Panel: Recent Labs  Lab 10/02/22 1632 10/02/22 2015 10/03/22 0311  NA 137  --  137  K 4.3  --  3.7  CL 100  --  98  CO2 30  --  31  GLUCOSE 134*  --  102*  BUN 11  --  10  CREATININE 1.04  --  0.81  CALCIUM 8.4*  --   8.2*  MG 2.2  --  2.5*  PHOS  --  4.7* 4.8*   GFR: Estimated Creatinine Clearance: 92.8 mL/min (by C-G formula based on SCr of 0.81 mg/dL). Recent Labs  Lab 10/02/22 1632 10/02/22 2015 10/03/22 0311  PROCALCITON  --  0.10  --   WBC 15.7*  --  13.7*  LATICACIDVEN 1.7  --   --     Liver Function Tests: Recent Labs  Lab 10/02/22 1632 10/03/22 0311  AST 17 15  ALT 18 16  ALKPHOS 46 40  BILITOT 0.3 0.6  PROT 6.7 6.1*  ALBUMIN 3.8 3.4*   No results for input(s): "LIPASE", "AMYLASE" in the last 168 hours. No results for input(s): "AMMONIA" in the last 168 hours.  ABG    Component Value Date/Time   PHART 7.223 (L) 09/20/2022 2031   PCO2ART 69.2 (HH) 09/20/2022 2031   PO2ART 55 (L) 09/20/2022 2031   HCO3 32.0 (H) 10/02/2022 1848   TCO2 30 09/20/2022 2031   ACIDBASEDEF 1.0 09/20/2022 2031   O2SAT 68.5 10/02/2022 1848     Coagulation Profile: Recent Labs  Lab 10/02/22 1632  INR 1.1    Cardiac Enzymes: No results for input(s): "CKTOTAL", "CKMB", "CKMBINDEX", "TROPONINI" in the last 168 hours.  HbA1C: No results found for: "HGBA1C"  CBG: Recent Labs  Lab 10/02/22 1636  GLUCAP 121*    Review of Systems:   Denies any significant issues at present  Past Medical History:  He,  has a past medical history of Spina bifida.   Surgical History:  History reviewed. No pertinent surgical history.   Social History:   reports that he has never smoked. He has never used smokeless tobacco. He reports that he does not currently use alcohol.   Family History:  His family history is not on file.   Allergies Allergies  Allergen Reactions   Morphine And Related Other (See Comments)    Hallucinations    Septra [Sulfamethoxazole-Trimethoprim] Other (See Comments)    Gi upset    Sulfamethoxazole Other (See Comments)    Gi upset    Trimethoprim Other (See Comments)    Unknown    Latex Swelling and Rash    Virl Diamond, MD [facility] Pager: See Loretha Stapler

## 2022-10-03 NOTE — Evaluation (Signed)
Clinical/Bedside Swallow Evaluation Patient Details  Name: Samuel Becker MRN: [id number] Date of Birth: Jun 29, 1990  Today's Date: 10/03/2022 Time: SLP Start Time (ACUTE ONLY): 1300 SLP Stop Time (ACUTE ONLY): 1320 SLP Time Calculation (min) (ACUTE ONLY): 20 min  Past Medical History:  Past Medical History:  Diagnosis Date   Spina bifida    Past Surgical History: History reviewed. No pertinent surgical history. HPI:  Patient is a 32 y.o. male with PMH: Spina bifida, Paraplegia, Neurogenic bladder, GERD, hydrocephalus s/p VP shunt presenting with shortness of breath, found to have acute CHF exacerbation. He was recently admitted to [facility] with PNA (4/4-09/26/22). He presented to the hospital for current admission on 10/02/2022 and upon admission to ED, via EMS, he was satting in the 30's with VBG showing pCO2 of 77 and he was started on BiPAP. CXR showed fluid overload. He was weaned from BiPAP to 6L oxygen via nasal cannula.    Assessment / Plan / Recommendation  Clinical Impression  Patient is not currently presenting with clinical s/s of dysphagia as per this bedside swallow evaluation. Patient eats slowly, especially with solid textures and did not exhibit any overt s/s aspiration or penetration during or after PO intake of solids (graham cracker) and thin liquids (water). No change in voice or vitals was observed. Patient's voice is hoarse and dysphonic which he and parents report has been occuring since last admission 09/21/22. Patient did not feel that his voice has improved much but his father did state he thinks he is slightly improved. All deny patient had any baseline voice impairment. SLP is recommending patient continue with regular solids, thin liquids with no further skilled intervention warranted. Although not part of this evaluation, as per informal voice assessment, would recommend referral to ENT. SLP Visit Diagnosis: Dysphagia, unspecified (R13.10)    Aspiration Risk  Mild  aspiration risk    Diet Recommendation Regular;Thin liquid   Liquid Administration via: Cup;Straw Medication Administration: Whole meds with liquid Supervision: Patient able to self feed Compensations: Slow rate;Small sips/bites Postural Changes: Seated upright at 90 degrees;Remain upright for at least 30 minutes after po intake    Other  Recommendations Oral Care Recommendations: Oral care BID    Recommendations for follow up therapy are one component of a multi-disciplinary discharge planning process, led by the attending physician.  Recommendations may be updated based on patient status, additional functional criteria and insurance authorization.  Follow up Recommendations No SLP follow up      Assistance Recommended at Discharge    Functional Status Assessment Patient has not had a recent decline in their functional status  Frequency and Duration     N/A       Prognosis   N/A     Swallow Study   General Date of Onset: 10/02/22 HPI: Patient is a 32 y.o. male with PMH: Spina bifida, Paraplegia, Neurogenic bladder, GERD, hydrocephalus s/p VP shunt presenting with shortness of breath, found to have acute CHF exacerbation. He was recently admitted to [facility] with PNA (4/4-09/26/22). He presented to the hospital for current admission on 10/02/2022 and upon admission to ED, via EMS, he was satting in the 30's with VBG showing pCO2 of 77 and he was started on BiPAP. CXR showed fluid overload. He was weaned from BiPAP to 6L oxygen via nasal cannula. Type of Study: Bedside Swallow Evaluation Previous Swallow Assessment: during recent past admission, BSE on 09/21/22 Diet Prior to this Study: Regular;Thin liquids (Level 0) Temperature Spikes Noted: No Respiratory Status: Nasal  cannula History of Recent Intubation: No Behavior/Cognition: Alert;Cooperative;Pleasant mood Oral Cavity Assessment: Within Functional Limits Oral Care Completed by SLP: No Oral Cavity - Dentition: Adequate natural  dentition Vision: Functional for self-feeding Self-Feeding Abilities: Able to feed self Patient Positioning: Upright in bed Baseline Vocal Quality: Hoarse;Aphonic Volitional Cough: Strong Volitional Swallow: Able to elicit    Oral/Motor/Sensory Function Overall Oral Motor/Sensory Function: Within functional limits   Ice Chips     Thin Liquid Thin Liquid: Within functional limits Presentation: Self Fed;Straw Pharyngeal  Phase Impairments: Other (comments) (no overt s/s)    Nectar Thick     Honey Thick     Puree Puree: Not tested   Solid     Solid: Within functional limits Presentation: Self Fed       Angela Nevin, MA, CCC-SLP Speech Therapy

## 2022-10-03 NOTE — Consult Note (Addendum)
Cardiology Consultation   Patient ID: Samuel Becker MRN: [id number]; DOB: September 03, 1990  Admit date: 10/02/2022 Date of Consult: 10/03/2022  PCP:  Samuel Cipro, NP   [facility] Providers Cardiologist:  None        Patient Profile:   Samuel Becker is a 32 y.o. male with a hx of spina bifida, paraplegia, neurogenic bladder who is being seen 10/03/2022 for the evaluation of CHF at the request of  Dr. Nelson Becker.  History of Present Illness:   [name] with above hx and no CAD -no prior echo- with recent discharge 09/26/22 with acute respiratory failure with hypoxia due to PNA, GERD, and volume overload.     Yesterday pt presented to ER by EMS with respiratory distress.  SP02 was in the 30s. Placed on BiPAP in ER.  CXR with volume overload and her rec'd IV lasix 40 mg and then  was able to wean to [place] 02.  He is neg 1000 yesterday and now neg 1L wt seems to be incorrect.  Now on Lasix 40 daily.IV.  pt notes his suddenly became dyspneic getting ready for shower. His sp02 was low so EMS called.   This was similar to prior admit. + lower ext edema.   EKG:  The EKG was personally reviewed and demonstrates:  ST with RVH rate 140  similar to prior EKG 09/20/22  also similar to EKG in 2022 though rate then is slower Telemetry:  Telemetry was personally reviewed and demonstrates:  SR , HR improved overnight.  BNP 335 hs troponin 73, 115, 143, 124, 86, 65   Today Na 137 K+ 3.7 BUN 10 Cr 0.81  Mg+ 2.5 alb 3.4   WBC 13.7 Hgb 13.5 plts 227 Ddimer <0.27   PCXR FINDINGS: Lung volumes are small. There is developing asymmetric bilateral airspace infiltrate, diffusely throughout the right lung and noted within the left lung base, asymmetric pulmonary edema versus infection. Small right pleural effusion suspected, new since prior exam. No pneumothorax. Cardiac size is stable and within normal limits given poor pulmonary insufflation. Moderate thoracolumbar levoscoliosis again  noted with thoracolumbar fusion instrumentation again noted. No acute bone abnormality. Ventriculoperitoneal shunt catheter tubing overlies the right hemithorax.   IMPRESSION: 1. Developing asymmetric bilateral airspace infiltrate, asymmetric pulmonary edema versus infection. 2. New small right pleural effusion. 3. Pulmonary hypoinflation.  Echo pending   BP 97/71  P 103 R 19  temp 99 sp02 on 2L 98%  Past Medical History:  Diagnosis Date   Spina bifida     History reviewed. No pertinent surgical history.   Home Medications:  Prior to Admission medications   Medication Sig Start Date End Date Taking? Authorizing Provider  acetaminophen (TYLENOL) 325 MG tablet Take 325 mg by mouth as needed for moderate pain.   Yes [provider]  albuterol (PROVENTIL) (2.5 MG/3ML) 0.083% nebulizer solution Take 3 mLs (2.5 mg total) by nebulization every 6 (six) hours as needed for wheezing or shortness of breath. 09/26/22  Yes Vann, Jessica U, DO  cetirizine (ZYRTEC) 10 MG tablet Take 10 mg by mouth daily.   Yes [provider]  Chlorpheniramine Maleate (ALLERGY RELIEF PO) Take 1 tablet by mouth daily as needed (congestion).   Yes [provider]  doxycycline (VIBRA-TABS) 100 MG tablet Take 1 tablet (100 mg total) by mouth every 12 (twelve) hours. 09/26/22  Yes Vann, Jessica U, DO  ibuprofen (ADVIL) 200 MG tablet Take 200 mg by mouth as needed for moderate pain.  Yes [provider]  Multiple Vitamins-Minerals (MULTIVITAMIN WITH MINERALS) tablet Take 1 tablet by mouth daily.   Yes [provider]  oxybutynin (DITROPAN XL) 15 MG 24 hr tablet Take 15 mg by mouth at bedtime.   Yes [provider]  pantoprazole (PROTONIX) 40 MG tablet Take 1 tablet (40 mg total) by mouth 2 (two) times daily. 09/26/22  Yes Vann, Jessica U, DO  VITAMIN D PO Take 1 capsule by mouth daily.   Yes [provider]    Inpatient Medications: Scheduled Meds:   Chlorhexidine Gluconate Cloth  6 each Topical Daily   cholecalciferol  400 Units Oral Daily   docusate sodium  100 mg Oral BID   furosemide  40 mg Intravenous Daily   ipratropium-albuterol  3 mL Nebulization BID   loratadine  10 mg Oral Daily   multivitamin with minerals  1 tablet Oral Daily   oxybutynin  15 mg Oral QHS   pantoprazole  40 mg Oral BID   sodium chloride flush  3 mL Intravenous Q12H   Continuous Infusions:  sodium chloride 5 mL/hr at 10/03/22 0600   azithromycin Stopped (10/03/22 0934)   cefTRIAXone (ROCEPHIN)  IV 1 g (10/03/22 0959)   PRN Meds: sodium chloride, acetaminophen **OR** acetaminophen, guaiFENesin, hydrALAZINE, ipratropium-albuterol, metoprolol tartrate, ondansetron **OR** ondansetron (ZOFRAN) IV, mouth rinse, pneumococcal 20-valent conjugate vaccine, senna-docusate, sodium chloride flush, traZODone  Allergies:    Allergies  Allergen Reactions   Morphine And Related Other (See Comments)    Hallucinations    Septra [Sulfamethoxazole-Trimethoprim] Other (See Comments)    Gi upset    Sulfamethoxazole Other (See Comments)    Gi upset    Trimethoprim Other (See Comments)    Unknown    Latex Swelling and Rash    Social History:   Social History   Socioeconomic History   Marital status: Single    Spouse name: Not on file   Number of children: Not on file   Years of education: Not on file   Highest education level: Not on file  Occupational History   Not on file  Tobacco Use   Smoking status: Never   Smokeless tobacco: Never  Substance and Sexual Activity   Alcohol use: Not Currently    Comment: occ   Drug use: Not on file   Sexual activity: Not on file  Other Topics Concern   Not on file  Social History Narrative   Not on file   Social Determinants of Health   Financial Resource Strain: Not on file  Food Insecurity: No Food Insecurity (10/03/2022)   Hunger Vital Sign    Worried About Running Out of Food in the Last Year: Never true     Ran Out of Food in the Last Year: Never true  Transportation Needs: No Transportation Needs (10/03/2022)   PRAPARE - Administrator, Civil Service (Medical): No    Lack of Transportation (Non-Medical): No  Physical Activity: Not on file  Stress: Not on file  Social Connections: Not on file  Intimate Partner Violence: Not At Risk (10/03/2022)   Humiliation, Afraid, Rape, and Kick questionnaire    Fear of Current or Ex-Partner: No    Emotionally Abused: No    Physically Abused: No    Sexually Abused: No    Family History:    Family History  Problem Relation Age of Onset   Heart attack Maternal Grandfather    Heart attack Paternal Grandfather      ROS:  Please see the history of present illness.  General:no colds or fevers, no weight changes Skin:no rashes or ulcers HEENT:no blurred vision, no congestion CV:see HPI PUL:see HPI GI:no diarrhea constipation or melena, no indigestion GU:no hematuria, no dysuria MS:no joint pain, no claudication spina bifida  wheel chair bound Neuro:no syncope, no lightheadedness Endo:no diabetes, no thyroid disease  All other ROS reviewed and negative.     Physical Exam/Data:   Vitals:   10/03/22 0815 10/03/22 0851 10/03/22 0900 10/03/22 1000  BP:   (!) 83/59 112/87  Pulse:   92 94  Resp:   19 (!) 23  Temp: 99 F (37.2 C)     TempSrc: Oral     SpO2:  99% 98% 98%  Weight:      Height:        Intake/Output Summary (Last 24 hours) at 10/03/2022 1101 Last data filed at 10/03/2022 0600 Gross per 24 hour  Intake 118.35 ml  Output 1000 ml  Net -881.65 ml      10/03/2022    4:35 AM 10/03/2022   12:30 AM 10/03/2022   12:25 AM  Last 3 Weights  Weight (lbs) 110 lb 7.2 oz 110 lb 7.2 oz 110 lb 7.2 oz  Weight (kg) 50.1 kg 50.1 kg 50.1 kg     Body mass index is 18.96 kg/m.  EXAM per Dr Mayford Knife    Relevant CV Studies: Echo 10/03/22 IMPRESSIONS     1. Left ventricular ejection fraction, by estimation, is 35 to 40%. The  left  ventricle has moderately decreased function. The left ventricle  demonstrates global hypokinesis. Left ventricular diastolic parameters are  indeterminate. Elevated left atrial  pressure.   2. Right ventricular systolic function is mildly reduced. The right  ventricular size is moderately enlarged.   3. Left atrial size was moderately dilated.   4. Right atrial size was moderately dilated.   5. The mitral valve was not well visualized. No evidence of mitral valve  regurgitation. No evidence of mitral stenosis.   6. Siever's Bicuspid Valve with mild Sinus of Valsalva Dilation and R-L  fusiona and raphe. The aortic valve is bicuspid. Aortic valve  regurgitation is trivial.   7. Aortic dilatation noted. There is mild dilatation of the aortic root,  measuring 43 mm.   8. The inferior vena cava is normal in size with greater than 50%  respiratory variability, suggesting right atrial pressure of 3 mmHg.   Comparison(s): No prior Echocardiogram.   FINDINGS   Left Ventricle: Left ventricular ejection fraction, by estimation, is 35  to 40%. The left ventricle has moderately decreased function. The left  ventricle demonstrates global hypokinesis. The left ventricular internal  cavity size was normal in size.  There is no left ventricular hypertrophy. Left ventricular diastolic  parameters are indeterminate. Elevated left atrial pressure.   Right Ventricle: The right ventricular size is moderately enlarged. Right  vetricular wall thickness was not well visualized. Right ventricular  systolic function is mildly reduced.   Left Atrium: Left atrial size was moderately dilated.   Right Atrium: Right atrial size was moderately dilated.   Pericardium: There is no evidence of pericardial effusion.   Mitral Valve: The mitral valve was not well visualized. No evidence of  mitral valve regurgitation. No evidence of mitral valve stenosis.   Tricuspid Valve: The tricuspid valve is grossly normal.  Tricuspid valve  regurgitation is not demonstrated. No evidence of tricuspid stenosis.   Aortic Valve: Siever's Bicuspid Valve with mild Sinus of  Valsalva Dilation  and R-L fusiona and raphe. The aortic valve is bicuspid. Aortic valve  regurgitation is trivial.   Pulmonic Valve: The pulmonic valve was normal in structure. Pulmonic valve  regurgitation is not visualized.   Aorta: Aortic dilatation noted. There is mild dilatation of the aortic  root, measuring 43 mm.   Pulmonary Artery: The pulmonary artery is of normal size.   Venous: The inferior vena cava is normal in size with greater than 50%  respiratory variability, suggesting right atrial pressure of 3 mmHg.   IAS/Shunts: No atrial level shunt detected by color flow Doppler.     LEFT VENTRICLE  PLAX 2D  LVIDd:         3.40 cm     Diastology  LVIDs:         3.00 cm     LV e' medial:    6.31 cm/s  LV PW:         0.80 cm     LV E/e' medial:  9.6  LV IVS:        0.80 cm     LV e' lateral:   9.36 cm/s  LVOT diam:     2.30 cm     LV E/e' lateral: 6.5  LV SV:         57  LV SV Index:   37  LVOT Area:     4.15 cm    LV Volumes (MOD)  LV vol d, MOD A2C: 63.6 ml  LV vol d, MOD A4C: 74.5 ml  LV vol s, MOD A2C: 37.3 ml  LV vol s, MOD A4C: 47.0 ml  LV SV MOD A2C:     26.3 ml  LV SV MOD A4C:     74.5 ml  LV SV MOD BP:      28.2 ml   RIGHT VENTRICLE             IVC  RV Basal diam:  4.40 cm     IVC diam: 1.30 cm  RV S prime:     18.30 cm/s  TAPSE (M-mode): 1.8 cm   LEFT ATRIUM             Index        RIGHT ATRIUM           Index  LA diam:        2.20 cm 1.45 cm/m   RA Area:     20.60 cm  LA Vol (A2C):   32.9 ml 21.64 ml/m  RA Volume:   62.90 ml  41.38 ml/m  LA Vol (A4C):   14.1 ml 9.28 ml/m  LA Biplane Vol: 21.5 ml 14.14 ml/m   AORTIC VALVE  LVOT Vmax:   71.10 cm/s  LVOT Vmean:  51.400 cm/s  LVOT VTI:    0.137 m    AORTA  Ao Root diam: 4.30 cm  Ao Asc diam:  3.40 cm   MITRAL VALVE  MV Area (PHT): 3.81 cm     SHUNTS  MV Decel Time: 199 msec    Systemic VTI:  0.14 m  MV E velocity: 60.40 cm/s  Systemic Diam: 2.30 cm  MV A velocity: 48.80 cm/s  MV E/A ratio:  1.24      Laboratory Data:  High Sensitivity Troponin:   Recent Labs  Lab 10/02/22 1848 10/02/22 2015 10/02/22 2305 10/03/22 0311 10/03/22 0702  TROPONINIHS 115* 143* 124* 86* 65*     Chemistry Recent Labs  Lab 10/02/22 1632  10/03/22 0311  NA 137 137  K 4.3 3.7  CL 100 98  CO2 30 31  GLUCOSE 134* 102*  BUN 11 10  CREATININE 1.04 0.81  CALCIUM 8.4* 8.2*  MG 2.2 2.5*  GFRNONAA >60 >60  ANIONGAP 7 8    Recent Labs  Lab 10/02/22 1632 10/03/22 0311  PROT 6.7 6.1*  ALBUMIN 3.8 3.4*  AST 17 15  ALT 18 16  ALKPHOS 46 40  BILITOT 0.3 0.6   Lipids No results for input(s): "CHOL", "TRIG", "HDL", "LABVLDL", "LDLCALC", "CHOLHDL" in the last 168 hours.  Hematology Recent Labs  Lab 10/02/22 1632 10/03/22 0311  WBC 15.7* 13.7*  RBC 4.79 4.35  HGB 14.7 13.5  HCT 46.9 42.2  MCV 97.9 97.0  MCH 30.7 31.0  MCHC 31.3 32.0  RDW 12.5 12.7  PLT 290 227   Thyroid  Recent Labs  Lab 10/02/22 1632  TSH 0.704    BNP Recent Labs  Lab 10/02/22 1632  BNP 335.1*    DDimer  Recent Labs  Lab 10/02/22 2015  DDIMER <0.27     Radiology/Studies:  DG Chest Port 1 View  Result Date: 10/02/2022 CLINICAL DATA:  Sepsis EXAM: PORTABLE CHEST 1 VIEW COMPARISON:  09/25/2022 FINDINGS: Lung volumes are small. There is developing asymmetric bilateral airspace infiltrate, diffusely throughout the right lung and noted within the left lung base, asymmetric pulmonary edema versus infection. Small right pleural effusion suspected, new since prior exam. No pneumothorax. Cardiac size is stable and within normal limits given poor pulmonary insufflation. Moderate thoracolumbar levoscoliosis again noted with thoracolumbar fusion instrumentation again noted. No acute bone abnormality. Ventriculoperitoneal shunt catheter tubing overlies the  right hemithorax. IMPRESSION: 1. Developing asymmetric bilateral airspace infiltrate, asymmetric pulmonary edema versus infection. 2. New small right pleural effusion. 3. Pulmonary hypoinflation. Electronically Signed   By: Helyn Numbers M.D.   On: 10/02/2022 16:59     Assessment and Plan:   Acute CHF/Respiratory failure/PNA may be contributing- post PNA with volume overload earlier this month.  Pt suddenly SOB and decreased sp02.  Similar to last presentation.  - given lasix on last hospitalization and able to wean 02.   His PNA treated with ABX   lasix with improvement on this admit.  Echo is being done now.  No angina  BP soft this AM  Elevated troponin in setting of hypoxia may be demand ischemia - if EF low consider ischemic eval with cardiac CTA   Spina Bifida/paraplegia - wheel chair bound. Chronic stable   Risk Assessment/Risk Scores:        New York Heart Association (NYHA) Functional Class NYHA Class IV        For questions or updates, please contact [facility] Please consult [email or web address] for contact info under    Signed, Nada Boozer, NP  10/03/2022 11:01 AM

## 2022-10-03 NOTE — Progress Notes (Signed)
Echocardiogram 2D Echocardiogram has been performed.  Toni Amend 10/03/2022, 10:38 AM

## 2022-10-03 NOTE — Progress Notes (Signed)
eLink Physician-Brief Progress Note Patient Name: Samuel Becker DOB: 1991-02-10 MRN: [id number]   Date of Service  10/03/2022  HPI/Events of Note  Paraplegic, wheelchair bound Spina Bifida patient discharged one week ago following hospitalization for pneumonia and sepsis who was brought back to the hospital today by EMS foe acute hypoxemic respiratory failure secondary to pulmonary edema, he initially required BIPAP but, following diuresis, is now on nasal cannula oxygen.  eICU Interventions  New Patient Evaluation.        Samuel Becker [name] 10/03/2022, 1:03 AM

## 2022-10-04 ENCOUNTER — Telehealth: Payer: Self-pay | Admitting: Internal Medicine

## 2022-10-04 DIAGNOSIS — Q2381 Bicuspid aortic valve: Secondary | ICD-10-CM

## 2022-10-04 DIAGNOSIS — R0902 Hypoxemia: Secondary | ICD-10-CM | POA: Diagnosis not present

## 2022-10-04 DIAGNOSIS — I5023 Acute on chronic systolic (congestive) heart failure: Secondary | ICD-10-CM | POA: Diagnosis not present

## 2022-10-04 DIAGNOSIS — Q231 Congenital insufficiency of aortic valve: Secondary | ICD-10-CM

## 2022-10-04 DIAGNOSIS — I5021 Acute systolic (congestive) heart failure: Secondary | ICD-10-CM | POA: Diagnosis not present

## 2022-10-04 DIAGNOSIS — Q052 Lumbar spina bifida with hydrocephalus: Secondary | ICD-10-CM

## 2022-10-04 DIAGNOSIS — I77810 Thoracic aortic ectasia: Secondary | ICD-10-CM

## 2022-10-04 DIAGNOSIS — I420 Dilated cardiomyopathy: Secondary | ICD-10-CM | POA: Diagnosis not present

## 2022-10-04 DIAGNOSIS — R7989 Other specified abnormal findings of blood chemistry: Secondary | ICD-10-CM | POA: Diagnosis not present

## 2022-10-04 DIAGNOSIS — J9611 Chronic respiratory failure with hypoxia: Secondary | ICD-10-CM

## 2022-10-04 LAB — BASIC METABOLIC PANEL
Anion gap: 12 (ref 5–15)
BUN: 18 mg/dL (ref 6–20)
CO2: 35 mmol/L — ABNORMAL HIGH (ref 22–32)
Calcium: 9.1 mg/dL (ref 8.9–10.3)
Chloride: 91 mmol/L — ABNORMAL LOW (ref 98–111)
Creatinine, Ser: 0.7 mg/dL (ref 0.61–1.24)
GFR, Estimated: >60 mL/min (ref >60)
Glucose, Bld: 118 mg/dL — ABNORMAL HIGH (ref 70–99)
Potassium: 4 mmol/L (ref 3.5–5.1)
Sodium: 138 mmol/L (ref 135–145)

## 2022-10-04 LAB — CULTURE, BLOOD (ROUTINE X 2): Culture: NO GROWTH

## 2022-10-04 LAB — CBC
HCT: 45.3 % (ref 39.0–52.0)
Hemoglobin: 14.4 g/dL (ref 13.0–17.0)
MCH: 30.5 pg (ref 26.0–34.0)
MCHC: 31.8 g/dL (ref 30.0–36.0)
MCV: 96 fL (ref 80.0–100.0)
Platelets: 228 10*3/uL (ref 150–400)
RBC: 4.72 MIL/uL (ref 4.22–5.81)
RDW: 12.4 % (ref 11.5–15.5)
WBC: 11.4 10*3/uL — ABNORMAL HIGH (ref 4.0–10.5)
nRBC: 0 % (ref 0.0–0.2)

## 2022-10-04 LAB — MAGNESIUM: Magnesium: 2.3 mg/dL (ref 1.7–2.4)

## 2022-10-04 MED ORDER — METOPROLOL SUCCINATE ER 25 MG PO TB24
25.0000 mg | ORAL_TABLET | Freq: Every day | ORAL | Status: DC
  Administered 2022-10-04: 25 mg via ORAL
  Filled 2022-10-04: qty 1

## 2022-10-04 MED ORDER — METOPROLOL TARTRATE 50 MG PO TABS
50.0000 mg | ORAL_TABLET | Freq: Once | ORAL | Status: AC | PRN
  Administered 2022-10-05: 50 mg via ORAL
  Filled 2022-10-04: qty 1

## 2022-10-04 MED ORDER — IVABRADINE HCL 7.5 MG PO TABS: 7.5000 mg | ORAL_TABLET | Freq: Once | ORAL | Status: DC | PRN

## 2022-10-04 NOTE — Progress Notes (Addendum)
Rounding Note    Patient Name: Samuel Becker Date of Encounter: 10/04/2022  [facility] Cardiologist: New to Dr Mayford Knife   Subjective   Patient states he feels much improved, less SOB, feet and thigh swelling are resolved, still on [place] oxygen which is not his baseline. He and his family members report that he was progressively SOB with routine activity over the past 2 weeks, had ongoing thighs and feet swelling for 2 weeks as well. He was not able to breath on 4/15 while taking shower and his hands were purple appearing. He is normally active, wheelchair bound, but drives wheelchair himself, able to moving things around independently at work. He denied ever having any chest pain with these activity, but was getting SOB over the past 2 weeks. He has been coughing, not fever or chills, denied hx of frequent pneumonia.   Inpatient Medications    Scheduled Meds:  Chlorhexidine Gluconate Cloth  6 each Topical Daily   cholecalciferol  400 Units Oral Daily   docusate sodium  100 mg Oral BID   feeding supplement  237 mL Oral BID BM   furosemide  40 mg Intravenous BID   ipratropium-albuterol  3 mL Nebulization BID   loratadine  10 mg Oral Daily   methylPREDNISolone (SOLU-MEDROL) injection  40 mg Intravenous Q12H   metoprolol succinate  25 mg Oral Daily   multivitamin with minerals  1 tablet Oral Daily   oxybutynin  15 mg Oral QHS   pantoprazole  40 mg Oral BID   sodium chloride flush  3 mL Intravenous Q12H   Continuous Infusions:  sodium chloride Stopped (10/03/22 1125)   azithromycin 500 mg (10/04/22 0802)   cefTRIAXone (ROCEPHIN)  IV Stopped (10/03/22 1033)   PRN Meds: sodium chloride, acetaminophen **OR** acetaminophen, guaiFENesin, hydrALAZINE, ipratropium-albuterol, metoprolol tartrate, ondansetron **OR** ondansetron (ZOFRAN) IV, mouth rinse, pneumococcal 20-valent conjugate vaccine, senna-docusate, sodium chloride flush, traZODone   Vital Signs    Vitals:    10/04/22 0407 10/04/22 0600 10/04/22 0700 10/04/22 0800  BP: 130/88 (!) 114/92  125/88  Pulse: 97 80 78 (!) 104  Resp: Temp: 98.3 F (36.8 C)   98.2 F (36.8 C)  TempSrc: Oral   Axillary  SpO2: 96% 96% 99% 99%  Weight:  46.2 kg    Height:        Intake/Output Summary (Last 24 hours) at 10/04/2022 0838 Last data filed at 10/04/2022 0800 Gross per 24 hour  Intake 604.44 ml  Output 4275 ml  Net -3670.56 ml      10/04/2022    6:00 AM 10/03/2022    4:35 AM 10/03/2022   12:30 AM  Last 3 Weights  Weight (lbs) 101 lb 13.6 oz 110 lb 7.2 oz 110 lb 7.2 oz  Weight (kg) 46.2 kg 50.1 kg 50.1 kg      Telemetry    Sinus tachycardia up to 120s  - Personally Reviewed  ECG    N/A today - Personally Reviewed  Physical Exam   GEN: No acute distress.   Neck: No JVD Cardiac: Tachycardiac, no murmurs, rubs, or gallops.  Respiratory: Fine crackles at base to auscultation bilaterally. On 2LNC. Speaks full sentence.  GI: Soft, nontender, non-distended  MS: No edema of bilateral lower legs and feet; BLE muscular atrophy noted Neuro:  Nonfocal  Psych: Normal affect   Labs    High Sensitivity Troponin:   Recent Labs  Lab 10/02/22 1848 10/02/22 2015 10/02/22 2305 10/03/22 1610  10/03/22 0702  TROPONINIHS 115* 143* 124* 86* 65*     Chemistry Recent Labs  Lab 10/02/22 1632 10/03/22 0311 10/04/22 0258  NA 137 137 138  K 4.3 3.7 4.0  CL 100 98 91*  CO2 30 31 35*  GLUCOSE 134* 102* 118*  BUN CREATININE 1.04 0.81 0.70  CALCIUM 8.4* 8.2* 9.1  MG 2.2 2.5* 2.3  PROT 6.7 6.1*  --   ALBUMIN 3.8 3.4*  --   AST 17 15  --   ALT 18 16  --   ALKPHOS 46 40  --   BILITOT 0.3 0.6  --   GFRNONAA >60 >60 >60  ANIONGAP Lipids No results for input(s): "CHOL", "TRIG", "HDL", "LABVLDL", "LDLCALC", "CHOLHDL" in the last 168 hours.  Hematology Recent Labs  Lab 10/02/22 1632 10/03/22 0311 10/04/22 0258  WBC 15.7* 13.7* 11.4*  RBC 4.79 4.35 4.72  HGB 14.7  13.5 14.4  HCT 46.9 42.2 45.3  MCV 97.9 97.0 96.0  MCH 30.7 31.0 30.5  MCHC 31.3 32.0 31.8  RDW 12.5 12.7 12.4  PLT 290 227 228   Thyroid  Recent Labs  Lab 10/02/22 1632  TSH 0.704    BNP Recent Labs  Lab 10/02/22 1632  BNP 335.1*    DDimer  Recent Labs  Lab 10/02/22 2015  DDIMER <0.27     Radiology    ECHOCARDIOGRAM COMPLETE  Result Date: 10/03/2022    ECHOCARDIOGRAM REPORT   Patient Name:   COHL BEHRENS Date of Exam: 10/03/2022 Medical Rec #:  [id number]            Height:       64.0 in Accession #:    [id number]           Weight:       110.4 lb Date of Birth:  02/13/1991             BSA:          1.520 m Patient Age:    31 years             BP:           112/87 mmHg Patient Gender: M                    HR:           99 bpm. Exam Location:  Inpatient Procedure: 2D Echo, Cardiac Doppler and Color Doppler Indications:    elevated troponin  History:        Patient has no prior history of Echocardiogram examinations.  Sonographer:    Mike Gip Referring Phys: [id number] ANASTASSIA DOUTOVA IMPRESSIONS  1. Left ventricular ejection fraction, by estimation, is 35 to 40%. The left ventricle has moderately decreased function. The left ventricle demonstrates global hypokinesis. Left ventricular diastolic parameters are indeterminate. Elevated left atrial pressure.  2. Right ventricular systolic function is mildly reduced. The right ventricular size is moderately enlarged.  3. Left atrial size was moderately dilated.  4. Right atrial size was moderately dilated.  5. The mitral valve was not well visualized. No evidence of mitral valve regurgitation. No evidence of mitral stenosis.  6. Siever's Bicuspid Valve with mild Sinus of Valsalva Dilation and R-L fusiona and raphe. The aortic valve is bicuspid. Aortic valve regurgitation is trivial.  7. Aortic dilatation noted. There is mild dilatation of the aortic root, measuring 43 mm.  8. The inferior vena cava is  normal in size with greater than  50% respiratory variability, suggesting right atrial pressure of 3 mmHg. Comparison(s): No prior Echocardiogram. FINDINGS  Left Ventricle: Left ventricular ejection fraction, by estimation, is 35 to 40%. The left ventricle has moderately decreased function. The left ventricle demonstrates global hypokinesis. The left ventricular internal cavity size was normal in size. There is no left ventricular hypertrophy. Left ventricular diastolic parameters are indeterminate. Elevated left atrial pressure. Right Ventricle: The right ventricular size is moderately enlarged. Right vetricular wall thickness was not well visualized. Right ventricular systolic function is mildly reduced. Left Atrium: Left atrial size was moderately dilated. Right Atrium: Right atrial size was moderately dilated. Pericardium: There is no evidence of pericardial effusion. Mitral Valve: The mitral valve was not well visualized. No evidence of mitral valve regurgitation. No evidence of mitral valve stenosis. Tricuspid Valve: The tricuspid valve is grossly normal. Tricuspid valve regurgitation is not demonstrated. No evidence of tricuspid stenosis. Aortic Valve: Siever's Bicuspid Valve with mild Sinus of Valsalva Dilation and R-L fusiona and raphe. The aortic valve is bicuspid. Aortic valve regurgitation is trivial. Pulmonic Valve: The pulmonic valve was normal in structure. Pulmonic valve regurgitation is not visualized. Aorta: Aortic dilatation noted. There is mild dilatation of the aortic root, measuring 43 mm. Pulmonary Artery: The pulmonary artery is of normal size. Venous: The inferior vena cava is normal in size with greater than 50% respiratory variability, suggesting right atrial pressure of 3 mmHg. IAS/Shunts: No atrial level shunt detected by color flow Doppler.  LEFT VENTRICLE PLAX 2D LVIDd:         3.40 cm     Diastology LVIDs:         3.00 cm     LV e' medial:    6.31 cm/s LV PW:         0.80 cm     LV E/e' medial:  9.6 LV IVS:         0.80 cm     LV e' lateral:   9.36 cm/s LVOT diam:     2.30 cm     LV E/e' lateral: 6.5 LV SV:         57 LV SV Index:   37 LVOT Area:     4.15 cm  LV Volumes (MOD) LV vol d, MOD A2C: 63.6 ml LV vol d, MOD A4C: 74.5 ml LV vol s, MOD A2C: 37.3 ml LV vol s, MOD A4C: 47.0 ml LV SV MOD A2C:     26.3 ml LV SV MOD A4C:     74.5 ml LV SV MOD BP:      28.2 ml RIGHT VENTRICLE             IVC RV Basal diam:  4.40 cm     IVC diam: 1.30 cm RV S prime:     18.30 cm/s TAPSE (M-mode): 1.8 cm LEFT ATRIUM             Index        RIGHT ATRIUM           Index LA diam:        2.20 cm 1.45 cm/m   RA Area:     20.60 cm LA Vol (A2C):   32.9 ml 21.64 ml/m  RA Volume:   62.90 ml  41.38 ml/m LA Vol (A4C):   14.1 ml 9.28 ml/m LA Biplane Vol: 21.5 ml 14.14 ml/m  AORTIC VALVE LVOT Vmax:   71.10 cm/s LVOT Vmean:  51.400 cm/s  LVOT VTI:    0.137 m  AORTA Ao Root diam: 4.30 cm Ao Asc diam:  3.40 cm MITRAL VALVE MV Area (PHT): 3.81 cm    SHUNTS MV Decel Time: 199 msec    Systemic VTI:  0.14 m MV E velocity: 60.40 cm/s  Systemic Diam: 2.30 cm MV A velocity: 48.80 cm/s MV E/A ratio:  1.24 Riley Lam MD Electronically signed by Riley Lam MD Signature Date/Time: 10/03/2022/11:09:25 AM    Final    DG Chest Port 1 View  Result Date: 10/02/2022 CLINICAL DATA:  Sepsis EXAM: PORTABLE CHEST 1 VIEW COMPARISON:  09/25/2022 FINDINGS: Lung volumes are small. There is developing asymmetric bilateral airspace infiltrate, diffusely throughout the right lung and noted within the left lung base, asymmetric pulmonary edema versus infection. Small right pleural effusion suspected, new since prior exam. No pneumothorax. Cardiac size is stable and within normal limits given poor pulmonary insufflation. Moderate thoracolumbar levoscoliosis again noted with thoracolumbar fusion instrumentation again noted. No acute bone abnormality. Ventriculoperitoneal shunt catheter tubing overlies the right hemithorax. IMPRESSION: 1. Developing asymmetric  bilateral airspace infiltrate, asymmetric pulmonary edema versus infection. 2. New small right pleural effusion. 3. Pulmonary hypoinflation. Electronically Signed   By: Helyn Numbers M.D.   On: 10/02/2022 16:59    Cardiac Studies   Echo from 10/03/22:   1. Left ventricular ejection fraction, by estimation, is 35 to 40%. The  left ventricle has moderately decreased function. The left ventricle  demonstrates global hypokinesis. Left ventricular diastolic parameters are  indeterminate. Elevated left atrial  pressure.   2. Right ventricular systolic function is mildly reduced. The right  ventricular size is moderately enlarged.   3. Left atrial size was moderately dilated.   4. Right atrial size was moderately dilated.   5. The mitral valve was not well visualized. No evidence of mitral valve  regurgitation. No evidence of mitral stenosis.   6. Siever's Bicuspid Valve with mild Sinus of Valsalva Dilation and R-L  fusiona and raphe. The aortic valve is bicuspid. Aortic valve  regurgitation is trivial.   7. Aortic dilatation noted. There is mild dilatation of the aortic root,  measuring 43 mm.   8. The inferior vena cava is normal in size with greater than 50%  respiratory variability, suggesting right atrial pressure of 3 mmHg.   Comparison(s): No prior Echocardiogram.   Patient Profile     32 y.o. male with PMH of spina bifida, paraplegia, neurogenic bladder, GERD who is currently admitted for acute hypoxic respiratory failure 2/2 pneumonia + newly discovered CHF with decompensation. Echo from 10/03/22 showed LVEF 35-40%. Cardiology is consulted on 10/03/22 for newly diagnosed low EF CHF.   Assessment & Plan    Newly diagnosed acute systolic heart failure  Dilated cardiomyopathy  - presented with acute hypoxic respiratory failure requiring BIPAP, sudden onset SOB while taking shower, increased DOE and BLE edema over 2 weeks; had recent hospitalization until 09/26/22 for sepsis 2/2 CAP  -  BNP 335, POA  - CXR with asymmetric bilateral airspace infiltrate, pulmonary edema vs infection, POA  - Echo from 10/03/22 showed LVEF 35-40%, global hypokinesis, indeterminate diastolic parameters, elevated LA pressure, mild reduced RV, mod LAE and RAE, bicuspid aortic valve with mild dilatation of the sinus of Valsalva, R-L fusion and raphe, and trivial AI  - Etiology remains unclear, possible viral or stress induced CM, low suspicion for ischemic CM given lacking risk factor and young age, serum and urine protein electrophoresis pending  - on IV Lasix  40mg  BID, Net - 4.2L and weight is down from 110 > 101.85 ibs since admission, clinically euvolemic today, recommend transition diuresis to PO Lasix 40mg  daily today  - discussed CHF management and signs to alert, diet change  - GDMT: BP is overall stable, will start Metoprolol XL 25mg  daily today (would help tachycardia too), if BP and renal function allows, plan to add Entresto 24/26mg  BID tomorrow   Elevated troponin  Family history of premature CAD - no historical angina symptoms  - Hs trop 115>143 >124> 86 >65 - EKG has non-specific T wave abnormalities of inferior and lateral leads since 4/3, some similar from 2022 - Echo as above - likely demand ischemia from CHF and infection, would allow time to recover from pneumonia, repeat Echo outpatient in 4-6 weeks, consider non-urgent ischemic evaluation if LVEF remains down  Bicuspid aortic valve  - no significant aortic stenosis or regurgitation - need routine Echo   Acute hypoxic respiratory failure  Health care associated pneumonia   Spina bifida with paraplegia and neurogenic bladder  GERD - per primary team     For questions or updates, please contact [place] Please consult [email or web address] for contact info under        Signed, Cyndi Bender, NP  10/04/2022, 8:38 AM

## 2022-10-04 NOTE — Progress Notes (Signed)
NAME:  Samuel Becker, MRN:  [id number], DOB:  April 07, 1991, LOS: 2 ADMISSION DATE:  10/02/2022, CONSULTATION DATE: 10/03/2022 REFERRING MD: Dr. Nelson Chimes, CHIEF COMPLAINT: Hypoxic respiratory failure   History of Present Illness:  asked to see patient for hypoxemic respiratory failure  He was recently in the hospital treated for pneumonia spent about 5 days in the hospital went home feeling a little bit better but did not get back to baseline  Was brought into the hospital secondary to having progressive shortness of breath, cyanosis of extremities  Was given nebulization treatments, placed on oxygen supplementation, BiPAP following initial evaluation -Was given diuretics and now feels a little bit better Does not have a history of respiratory problems No history of cardiac problems History of spina bifida, very functional-wheelchair dependent  Pertinent  Medical History   Past Medical History:  Diagnosis Date   Spina bifida    Significant Hospital Events: Including procedures, antibiotic start and stop dates in addition to other pertinent events   ABG 7.22/69/55 4/15 chest x-ray with bilateral infiltrate, concern for infection versus congestion Interim History / Subjective:  Breathing improved today substantially since admission. Family at bedside.   Objective   Blood pressure 125/88, pulse (!) 104, temperature 98.2 F (36.8 C), temperature source Axillary, resp. rate 16, height  (1.626 m), weight 46.2 kg, SpO2 99 %.        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 Suspect this is neuromuscular weakness related to spina bifida. Will need PFTs as an outpatient and be set up with nocturnal Pap therapy - continue bronchodilators, has a nebulizer machine at home.  - abx can be discontinued. Steroids can be stopped.  - agree with diuresis - appreciate cardiology consultation in evaluation of new diagnosis cardiomyopathy EF 35%  Spina bifida Neurogenic bladder Chronic constipation - rest per primary team.    Durel Salts, MD Pulmonary and Critical Care Medicine [facility] 10/04/2022 9:45 AM Pager: see AMION  If no response to pager, please call critical care on call (see AMION) until 7pm After 7:00 pm call Elink     Labs   CBC: Recent Labs  Lab 10/02/22 1632 10/03/22 0311 10/04/22 0258  WBC 15.7* 13.7* 11.4*  NEUTROABS 13.5*  --   --   HGB 14.7 13.5 14.4  HCT 46.9 42.2 45.3  MCV 97.9 97.0 96.0  PLT 290 227 228    Basic Metabolic Panel: Recent Labs  Lab 10/02/22 1632 10/02/22 2015 10/03/22 0311 10/04/22 0258  NA 137  --  137 138  K 4.3  --  3.7 4.0  CL 100  --  98 91*  CO2 30  --  31 35*  GLUCOSE 134*  --  102* 118*  BUN 11  --  10 18  CREATININE 1.04  --  0.81 0.70  CALCIUM 8.4*  --  8.2* 9.1  MG 2.2  --  2.5* 2.3  PHOS  --  4.7* 4.8*  --    GFR: Estimated Creatinine Clearance: 86.6 mL/min (by C-G formula based on SCr of 0.7 mg/dL). Recent Labs  Lab 10/02/22 1632 10/02/22 2015 10/03/22 0311 10/04/22 0258  PROCALCITON  --  0.10  --   --   WBC 15.7*  --  13.7* 11.4*  LATICACIDVEN 1.7  --   --   --     Liver Function Tests: Recent Labs  Lab  10/02/22 1632 10/03/22 0311  AST 17 15  ALT 18 16  ALKPHOS 46 40  BILITOT 0.3 0.6  PROT 6.7 6.1*  ALBUMIN 3.8 3.4*   No results for input(s): "LIPASE", "AMYLASE" in the last 168 hours. No results for input(s): "AMMONIA" in the last 168 hours.  ABG    Component Value Date/Time   PHART 7.223 (L) 09/20/2022 2031   PCO2ART 69.2 (HH) 09/20/2022 2031   PO2ART 55 (L) 09/20/2022 2031   HCO3 32.0 (H) 10/02/2022 1848   TCO2 30 09/20/2022 2031   ACIDBASEDEF 1.0 09/20/2022 2031   O2SAT 68.5 10/02/2022 1848     Coagulation Profile: Recent Labs  Lab 10/02/22 1632  INR 1.1    Cardiac Enzymes: No results for input(s): "CKTOTAL", "CKMB", "CKMBINDEX", "TROPONINI" in the last 168 hours.  HbA1C: No results found for: "HGBA1C"  CBG: Recent Labs  Lab 10/02/22 1636  GLUCAP 121*

## 2022-10-04 NOTE — Telephone Encounter (Signed)
Needs follow up with any doc or APP in the next two weeks after PFTS (ordered.)

## 2022-10-04 NOTE — Telephone Encounter (Signed)
First available PFT is 11/03/2022 in [place]. Patient scheduled 5/17 at 11:30am for PFT and consult scheduled with Dr. Everardo All right after at 1pm in [place]. PFT instructions mailed along with Pulmonary consult and reminder to address on file.

## 2022-10-04 NOTE — Progress Notes (Addendum)
PROGRESS NOTE    Samuel Becker  GUY:[id number] DOB: 1990-10-19 DOA: 10/02/2022 PCP: Moshe Cipro, NP   Brief Narrative:  32 year old with history of spina bifida, paraplegia, neurogenic bladder, GERD comes to the hospital for shortness of breath/hypoxic/hypercarbic respiratory failure requiring BiPAP.  Upon admission there was concerns of fluid overload versus community-acquired pneumonia.  Patient was recently discharged on April 9 after being treated for sepsis complicated by fluid overload.  Chest x-ray showed asymmetric bilateral infiltrate.  Started on vancomycin and cefepime.  Antibiotics de-escalated, started on Lasix.  Echo shows new onset CHF with EF 35%, cardiology consulted   Assessment & Plan:  Principal Problem:   Acute exacerbation of CHF (congestive heart failure) Active Problems:   Acute respiratory failure with hypoxemia   CAP (community acquired pneumonia)   Elevated troponin   Spina bifida   Paraplegia   Pressure injury of skin   Dilated cardiomyopathy   Acute systolic CHF (congestive heart failure)    Acute respiratory failure with hypoxia/hypercapnia, significantly improved -Multifactorial in the setting of volume overload and possible underlying community-acquired pneumonia.  Respiratory panel, COVID negative.  Cardiology and pulmonary following.  Recently completed course of antibiotics. Stop steroids.  -BiPAP has been weaned down to nasal cannula.   -Out of bed to chair - Procalcitonin 0.1, BNP elevated  Acute congestive heart failure with reduced ejection fraction, EF 35%.  New diagnosis - Echocardiogram shows new diagnosis of reduced EF 35%.  Continue IV Lasix.  Cardiology following. Considering CTA  Community-acquired pneumonia - Procalcitonin 0.1, Recently treatment with Abx. Stop Abx. Repeat CXR outpatient in 8 weeks.  - Strep pneumo urine antigen-negative -Urine Legionella-pending  Spina bifida Paraplegia with neurogenic bladder -  Stable.  Home oxybutynin  GERD - PPI  Chronic constipation - Enemas at home. Bowel regimen prn   DVT prophylaxis: SCDs Code Status: Full code Family Communication: Family at bedside  Status is: Inpatient Remains inpatient appropriate because: Continue hospital stay for shortness of breath   Pressure Injury 10/03/22 Vertebral column Lower;Mid Stage 2 -  Partial thickness loss of dermis presenting as a shallow open injury with a red, pink wound bed without slough. Pressure injury, advanced stage of healing (Active)  10/03/22 0045  Location: Vertebral column  Location Orientation: Lower;Mid  Staging: Stage 2 -  Partial thickness loss of dermis presenting as a shallow open injury with a red, pink wound bed without slough.  Wound Description (Comments): Pressure injury, advanced stage of healing  Present on Admission: Yes     Diet Orders (From admission, onward)     Start     Ordered   10/03/22 0954  Diet regular Room service appropriate? Yes; Fluid consistency: Thin  Diet effective now       Question Answer Comment  Room service appropriate? Yes   Fluid consistency: Thin      10/03/22 0953            Subjective: Doing ok, no complaints. Breathing improved.   Examination: Constitutional: Not in acute distress 2L [place] Respiratory: bibasilar crackles.  Cardiovascular: Normal sinus rhythm, no rubs Abdomen: Nontender nondistended good bowel sounds Musculoskeletal: No edema noted Skin: No rashes seen Neurologic: CN 2-12 grossly intact.  And nonfocal Psychiatric: Normal judgment and insight. Alert and oriented x 3. Normal mood.    Objective: Vitals:   10/04/22 0004 10/04/22 0200 10/04/22 0407 10/04/22 0600  BP: 120/74 114/75 130/88 (!) 114/92  Pulse:  89 97 80  Resp: 19 12 19 17   Temp:  98.1 F (36.7 C)  98.3 F (36.8 C)   TempSrc: Oral  Oral   SpO2: 96% 98% 96% 96%  Weight:    46.2 kg  Height:        Intake/Output Summary (Last 24 hours) at 10/04/2022  0721 Last data filed at 10/04/2022 0600 Gross per 24 hour  Intake 604.44 ml  Output 3975 ml  Net -3370.56 ml   Filed Weights   10/03/22 0030 10/03/22 0435 10/04/22 0600  Weight: 50.1 kg 50.1 kg 46.2 kg    Scheduled Meds:  Chlorhexidine Gluconate Cloth  6 each Topical Daily   cholecalciferol  400 Units Oral Daily   docusate sodium  100 mg Oral BID   feeding supplement  237 mL Oral BID BM   furosemide  40 mg Intravenous BID   ipratropium-albuterol  3 mL Nebulization BID   loratadine  10 mg Oral Daily   methylPREDNISolone (SOLU-MEDROL) injection  40 mg Intravenous Q12H   multivitamin with minerals  1 tablet Oral Daily   oxybutynin  15 mg Oral QHS   pantoprazole  40 mg Oral BID   sodium chloride flush  3 mL Intravenous Q12H   Continuous Infusions:  sodium chloride Stopped (10/03/22 1125)   azithromycin Stopped (10/03/22 0934)   cefTRIAXone (ROCEPHIN)  IV Stopped (10/03/22 1033)    Nutritional status Signs/Symptoms: estimated needs Interventions: Refer to RD note for recommendations, Ensure Enlive (each supplement provides 350kcal and 20 grams of protein), MVI Body mass index is 17.48 kg/m.  Data Reviewed:   CBC: Recent Labs  Lab 10/02/22 1632 10/03/22 0311 10/04/22 0258  WBC 15.7* 13.7* 11.4*  NEUTROABS 13.5*  --   --   HGB 14.7 13.5 14.4  HCT 46.9 42.2 45.3  MCV 97.9 97.0 96.0  PLT 290 227 228   Basic Metabolic Panel: Recent Labs  Lab 10/02/22 1632 10/02/22 2015 10/03/22 0311 10/04/22 0258  NA 137  --  137 138  K 4.3  --  3.7 4.0  CL 100  --  98 91*  CO2 30  --  31 35*  GLUCOSE 134*  --  102* 118*  BUN 11  --  10 18  CREATININE 1.04  --  0.81 0.70  CALCIUM 8.4*  --  8.2* 9.1  MG 2.2  --  2.5* 2.3  PHOS  --  4.7* 4.8*  --    GFR: Estimated Creatinine Clearance: 86.6 mL/min (by C-G formula based on SCr of 0.7 mg/dL). Liver Function Tests: Recent Labs  Lab 10/02/22 1632 10/03/22 0311  AST 17 15  ALT 18 16  ALKPHOS 46 40  BILITOT 0.3 0.6  PROT  6.7 6.1*  ALBUMIN 3.8 3.4*   No results for input(s): "LIPASE", "AMYLASE" in the last 168 hours. No results for input(s): "AMMONIA" in the last 168 hours. Coagulation Profile: Recent Labs  Lab 10/02/22 1632  INR 1.1   Cardiac Enzymes: No results for input(s): "CKTOTAL", "CKMB", "CKMBINDEX", "TROPONINI" in the last 168 hours. BNP (last 3 results) No results for input(s): "PROBNP" in the last 8760 hours. HbA1C: No results for input(s): "HGBA1C" in the last 72 hours. CBG: Recent Labs  Lab 10/02/22 1636  GLUCAP 121*   Lipid Profile: No results for input(s): "CHOL", "HDL", "LDLCALC", "TRIG", "CHOLHDL", "LDLDIRECT" in the last 72 hours. Thyroid Function Tests: Recent Labs    10/02/22 1632  TSH 0.704   Anemia Panel: Recent Labs    10/03/22 1506  FERRITIN 35   Sepsis Labs: Recent Labs  Lab  10/02/22 1632 10/02/22 2015  PROCALCITON  --  0.10  LATICACIDVEN 1.7  --     Recent Results (from the past 240 hour(s))  Respiratory (~20 pathogens) panel by PCR     Status: None   Collection Time: 09/25/22  9:40 AM   Specimen: Nasopharyngeal Swab; Respiratory  Result Value Ref Range Status   Adenovirus NOT DETECTED NOT DETECTED Final   Coronavirus 229E NOT DETECTED NOT DETECTED Final    Comment: (NOTE) The Coronavirus on the Respiratory Panel, DOES NOT test for the novel  Coronavirus (2019 nCoV)    Coronavirus HKU1 NOT DETECTED NOT DETECTED Final   Coronavirus NL63 NOT DETECTED NOT DETECTED Final   Coronavirus OC43 NOT DETECTED NOT DETECTED Final   Metapneumovirus NOT DETECTED NOT DETECTED Final   Rhinovirus / Enterovirus NOT DETECTED NOT DETECTED Final   Influenza A NOT DETECTED NOT DETECTED Final   Influenza B NOT DETECTED NOT DETECTED Final   Parainfluenza Virus 1 NOT DETECTED NOT DETECTED Final   Parainfluenza Virus 2 NOT DETECTED NOT DETECTED Final   Parainfluenza Virus 3 NOT DETECTED NOT DETECTED Final   Parainfluenza Virus 4 NOT DETECTED NOT DETECTED Final    Respiratory Syncytial Virus NOT DETECTED NOT DETECTED Final   Bordetella pertussis NOT DETECTED NOT DETECTED Final   Bordetella Parapertussis NOT DETECTED NOT DETECTED Final   Chlamydophila pneumoniae NOT DETECTED NOT DETECTED Final   Mycoplasma pneumoniae NOT DETECTED NOT DETECTED Final    Comment: Performed at [facility], [address], [place], [place] [address]  Urine Culture (for pregnant, neutropenic or urologic patients or patients with an indwelling urinary catheter)     Status: None   Collection Time: 10/02/22  4:40 PM   Specimen: Urine, Clean Catch  Result Value Ref Range Status   Specimen Description   Final    URINE, CLEAN CATCH Performed at [facility], [address], [place], [place] [address]    Special Requests   Final    NONE Performed at [facility], [address] [place], [place] [address]    Culture   Final    NO GROWTH Performed at [facility], [address], [place], [place] [address]    Report Status 10/03/2022 FINAL  Final  SARS Coronavirus 2 by RT PCR (hospital order, performed in [facility] hospital lab) *cepheid single result test*     Status: None   Collection Time: 10/02/22  4:58 PM  Result Value Ref Range Status   SARS Coronavirus 2 by RT PCR NEGATIVE NEGATIVE Final    Comment: (NOTE) SARS-CoV-2 target nucleic acids are NOT DETECTED.  The SARS-CoV-2 RNA is generally detectable in upper and lower respiratory specimens during the acute phase of infection. The lowest concentration of SARS-CoV-2 viral copies this assay can detect is 250 copies / mL. A negative result does not preclude SARS-CoV-2 infection and should not be used as the sole basis for treatment or other patient management decisions.  A negative result may occur with improper specimen collection / handling, submission of specimen other than nasopharyngeal swab, presence of viral mutation(s) within the areas targeted by this  assay, and inadequate number of viral copies (<250 copies / mL). A negative result must be combined with clinical observations, patient history, and epidemiological information.  Fact Sheet for Patients:   RoadLapTop.co.za  Fact Sheet for Healthcare Providers: http://kim-miller.com/  This test is not yet approved or  cleared by the [place] FDA and has  been authorized for detection and/or diagnosis of SARS-CoV-2 by FDA under an Emergency Use Authorization (EUA).  This EUA will remain in effect (meaning this test can be used) for the duration of the COVID-19 declaration under Section 564(b)(1) of the Act, 21 U.S.C. section 360bbb-3(b)(1), unless the authorization is terminated or revoked sooner.  Performed at [facility], [address], [place], [place] [address]   Respiratory (~20 pathogens) panel by PCR     Status: None   Collection Time: 10/02/22  4:58 PM   Specimen: Urine, Clean Catch; Respiratory  Result Value Ref Range Status   Adenovirus NOT DETECTED NOT DETECTED Final   Coronavirus 229E NOT DETECTED NOT DETECTED Final    Comment: (NOTE) The Coronavirus on the Respiratory Panel, DOES NOT test for the novel  Coronavirus (2019 nCoV)    Coronavirus HKU1 NOT DETECTED NOT DETECTED Final   Coronavirus NL63 NOT DETECTED NOT DETECTED Final   Coronavirus OC43 NOT DETECTED NOT DETECTED Final   Metapneumovirus NOT DETECTED NOT DETECTED Final   Rhinovirus / Enterovirus NOT DETECTED NOT DETECTED Final   Influenza A NOT DETECTED NOT DETECTED Final   Influenza B NOT DETECTED NOT DETECTED Final   Parainfluenza Virus 1 NOT DETECTED NOT DETECTED Final   Parainfluenza Virus 2 NOT DETECTED NOT DETECTED Final   Parainfluenza Virus 3 NOT DETECTED NOT DETECTED Final   Parainfluenza Virus 4 NOT DETECTED NOT DETECTED Final   Respiratory Syncytial Virus NOT DETECTED NOT DETECTED Final   Bordetella pertussis NOT DETECTED NOT  DETECTED Final   Bordetella Parapertussis NOT DETECTED NOT DETECTED Final   Chlamydophila pneumoniae NOT DETECTED NOT DETECTED Final   Mycoplasma pneumoniae NOT DETECTED NOT DETECTED Final    Comment: Performed at [facility], [address] [place], [place] [address]  MRSA Next Gen by PCR, Nasal     Status: None   Collection Time: 10/02/22 11:05 PM   Specimen: Nasal Mucosa; Nasal Swab  Result Value Ref Range Status   MRSA by PCR Next Gen NOT DETECTED NOT DETECTED Final    Comment: (NOTE) The GeneXpert MRSA Assay (FDA approved for NASAL specimens only), is one component of a comprehensive MRSA colonization surveillance program. It is not intended to diagnose MRSA infection nor to guide or monitor treatment for MRSA infections. Test performance is not FDA approved in patients less than 37 years old. Performed at [facility], [address] [place], [place] [address]          Radiology Studies: ECHOCARDIOGRAM COMPLETE  Result Date: 10/03/2022    ECHOCARDIOGRAM REPORT   Patient Name:   Samuel Becker Date of Exam: 10/03/2022 Medical Rec #:  [id number]            Height:       64.0 in Accession #:    [id number]           Weight:       110.4 lb Date of Birth:  01/21/1991             BSA:          1.520 m Patient Age:    31 years             BP:           112/87 mmHg Patient Gender: M                    HR:  99 bpm. Exam Location:  Inpatient Procedure: 2D Echo, Cardiac Doppler and Color Doppler Indications:    elevated troponin  History:        Patient has no prior history of Echocardiogram examinations.  Sonographer:    Mike Gip Referring Phys: [id number] ANASTASSIA DOUTOVA IMPRESSIONS  1. Left ventricular ejection fraction, by estimation, is 35 to 40%. The left ventricle has moderately decreased function. The left ventricle demonstrates global hypokinesis. Left ventricular diastolic parameters are indeterminate. Elevated left atrial pressure.  2. Right  ventricular systolic function is mildly reduced. The right ventricular size is moderately enlarged.  3. Left atrial size was moderately dilated.  4. Right atrial size was moderately dilated.  5. The mitral valve was not well visualized. No evidence of mitral valve regurgitation. No evidence of mitral stenosis.  6. Siever's Bicuspid Valve with mild Sinus of Valsalva Dilation and R-L fusiona and raphe. The aortic valve is bicuspid. Aortic valve regurgitation is trivial.  7. Aortic dilatation noted. There is mild dilatation of the aortic root, measuring 43 mm.  8. The inferior vena cava is normal in size with greater than 50% respiratory variability, suggesting right atrial pressure of 3 mmHg. Comparison(s): No prior Echocardiogram. FINDINGS  Left Ventricle: Left ventricular ejection fraction, by estimation, is 35 to 40%. The left ventricle has moderately decreased function. The left ventricle demonstrates global hypokinesis. The left ventricular internal cavity size was normal in size. There is no left ventricular hypertrophy. Left ventricular diastolic parameters are indeterminate. Elevated left atrial pressure. Right Ventricle: The right ventricular size is moderately enlarged. Right vetricular wall thickness was not well visualized. Right ventricular systolic function is mildly reduced. Left Atrium: Left atrial size was moderately dilated. Right Atrium: Right atrial size was moderately dilated. Pericardium: There is no evidence of pericardial effusion. Mitral Valve: The mitral valve was not well visualized. No evidence of mitral valve regurgitation. No evidence of mitral valve stenosis. Tricuspid Valve: The tricuspid valve is grossly normal. Tricuspid valve regurgitation is not demonstrated. No evidence of tricuspid stenosis. Aortic Valve: Siever's Bicuspid Valve with mild Sinus of Valsalva Dilation and R-L fusiona and raphe. The aortic valve is bicuspid. Aortic valve regurgitation is trivial. Pulmonic Valve: The  pulmonic valve was normal in structure. Pulmonic valve regurgitation is not visualized. Aorta: Aortic dilatation noted. There is mild dilatation of the aortic root, measuring 43 mm. Pulmonary Artery: The pulmonary artery is of normal size. Venous: The inferior vena cava is normal in size with greater than 50% respiratory variability, suggesting right atrial pressure of 3 mmHg. IAS/Shunts: No atrial level shunt detected by color flow Doppler.  LEFT VENTRICLE PLAX 2D LVIDd:         3.40 cm     Diastology LVIDs:         3.00 cm     LV e' medial:    6.31 cm/s LV PW:         0.80 cm     LV E/e' medial:  9.6 LV IVS:        0.80 cm     LV e' lateral:   9.36 cm/s LVOT diam:     2.30 cm     LV E/e' lateral: 6.5 LV SV:         57 LV SV Index:   37 LVOT Area:     4.15 cm  LV Volumes (MOD) LV vol d, MOD A2C: 63.6 ml LV vol d, MOD A4C: 74.5 ml LV vol s, MOD A2C: 37.3 ml LV  vol s, MOD A4C: 47.0 ml LV SV MOD A2C:     26.3 ml LV SV MOD A4C:     74.5 ml LV SV MOD BP:      28.2 ml RIGHT VENTRICLE             IVC RV Basal diam:  4.40 cm     IVC diam: 1.30 cm RV S prime:     18.30 cm/s TAPSE (M-mode): 1.8 cm LEFT ATRIUM             Index        RIGHT ATRIUM           Index LA diam:        2.20 cm 1.45 cm/m   RA Area:     20.60 cm LA Vol (A2C):   32.9 ml 21.64 ml/m  RA Volume:   62.90 ml  41.38 ml/m LA Vol (A4C):   14.1 ml 9.28 ml/m LA Biplane Vol: 21.5 ml 14.14 ml/m  AORTIC VALVE LVOT Vmax:   71.10 cm/s LVOT Vmean:  51.400 cm/s LVOT VTI:    0.137 m  AORTA Ao Root diam: 4.30 cm Ao Asc diam:  3.40 cm MITRAL VALVE MV Area (PHT): 3.81 cm    SHUNTS MV Decel Time: 199 msec    Systemic VTI:  0.14 m MV E velocity: 60.40 cm/s  Systemic Diam: 2.30 cm MV A velocity: 48.80 cm/s MV E/A ratio:  1.24 Riley Lam MD Electronically signed by Riley Lam MD Signature Date/Time: 10/03/2022/11:09:25 AM    Final    DG Chest Port 1 View  Result Date: 10/02/2022 CLINICAL DATA:  Sepsis EXAM: PORTABLE CHEST 1 VIEW COMPARISON:   09/25/2022 FINDINGS: Lung volumes are small. There is developing asymmetric bilateral airspace infiltrate, diffusely throughout the right lung and noted within the left lung base, asymmetric pulmonary edema versus infection. Small right pleural effusion suspected, new since prior exam. No pneumothorax. Cardiac size is stable and within normal limits given poor pulmonary insufflation. Moderate thoracolumbar levoscoliosis again noted with thoracolumbar fusion instrumentation again noted. No acute bone abnormality. Ventriculoperitoneal shunt catheter tubing overlies the right hemithorax. IMPRESSION: 1. Developing asymmetric bilateral airspace infiltrate, asymmetric pulmonary edema versus infection. 2. New small right pleural effusion. 3. Pulmonary hypoinflation. Electronically Signed   By: Helyn Numbers M.D.   On: 10/02/2022 16:59           LOS: 2 days   Time spent= 35 mins    Jabe Jeanbaptiste Joline Maxcy, MD [facility]  If 7PM-7AM, please contact night-coverage  10/04/2022, 7:21 AM

## 2022-10-05 ENCOUNTER — Inpatient Hospital Stay (HOSPITAL_COMMUNITY): Payer: Medicare Other

## 2022-10-05 DIAGNOSIS — Q231 Congenital insufficiency of aortic valve: Secondary | ICD-10-CM

## 2022-10-05 DIAGNOSIS — R7989 Other specified abnormal findings of blood chemistry: Secondary | ICD-10-CM | POA: Diagnosis not present

## 2022-10-05 DIAGNOSIS — I77810 Thoracic aortic ectasia: Secondary | ICD-10-CM

## 2022-10-05 DIAGNOSIS — I420 Dilated cardiomyopathy: Secondary | ICD-10-CM

## 2022-10-05 DIAGNOSIS — I5023 Acute on chronic systolic (congestive) heart failure: Secondary | ICD-10-CM | POA: Diagnosis not present

## 2022-10-05 DIAGNOSIS — E876 Hypokalemia: Secondary | ICD-10-CM

## 2022-10-05 DIAGNOSIS — J9601 Acute respiratory failure with hypoxia: Secondary | ICD-10-CM | POA: Diagnosis not present

## 2022-10-05 DIAGNOSIS — I5021 Acute systolic (congestive) heart failure: Secondary | ICD-10-CM | POA: Diagnosis not present

## 2022-10-05 LAB — CBC
HCT: 42.9 % (ref 39.0–52.0)
Hemoglobin: 13.4 g/dL (ref 13.0–17.0)
MCH: 30.1 pg (ref 26.0–34.0)
MCHC: 31.2 g/dL (ref 30.0–36.0)
MCV: 96.4 fL (ref 80.0–100.0)
Platelets: 240 10*3/uL (ref 150–400)
RBC: 4.45 MIL/uL (ref 4.22–5.81)
RDW: 12.8 % (ref 11.5–15.5)
WBC: 8.9 10*3/uL (ref 4.0–10.5)
nRBC: 0 % (ref 0.0–0.2)

## 2022-10-05 LAB — BASIC METABOLIC PANEL
Anion gap: 12 (ref 5–15)
BUN: 21 mg/dL — ABNORMAL HIGH (ref 6–20)
CO2: 38 mmol/L — ABNORMAL HIGH (ref 22–32)
Calcium: 9.1 mg/dL (ref 8.9–10.3)
Chloride: 88 mmol/L — ABNORMAL LOW (ref 98–111)
Creatinine, Ser: 0.88 mg/dL (ref 0.61–1.24)
GFR, Estimated: >60 mL/min (ref >60)
Glucose, Bld: 95 mg/dL (ref 70–99)
Potassium: 3 mmol/L — ABNORMAL LOW (ref 3.5–5.1)
Sodium: 138 mmol/L (ref 135–145)

## 2022-10-05 LAB — CULTURE, BLOOD (ROUTINE X 2)
Culture: NO GROWTH
Special Requests: ADEQUATE

## 2022-10-05 LAB — MAGNESIUM: Magnesium: 2.1 mg/dL (ref 1.7–2.4)

## 2022-10-05 LAB — LEGIONELLA PNEUMOPHILA SEROGP 1 UR AG: L. pneumophila Serogp 1 Ur Ag: NEGATIVE

## 2022-10-05 MED ORDER — IVABRADINE HCL 5 MG PO TABS
7.5000 mg | ORAL_TABLET | Freq: Once | ORAL | Status: AC | PRN
  Administered 2022-10-05: 7.5 mg via ORAL
  Filled 2022-10-05: qty 1

## 2022-10-05 MED ORDER — SACUBITRIL-VALSARTAN 24-26 MG PO TABS: 1.0000 | ORAL_TABLET | Freq: Two times a day (BID) | ORAL | Status: DC

## 2022-10-05 MED ORDER — POTASSIUM CHLORIDE CRYS ER 20 MEQ PO TBCR
40.0000 meq | EXTENDED_RELEASE_TABLET | ORAL | Status: AC
  Administered 2022-10-05 (×2): 40 meq via ORAL
  Filled 2022-10-05 (×2): qty 2

## 2022-10-05 MED ORDER — LOPERAMIDE HCL 2 MG PO CAPS
2.0000 mg | ORAL_CAPSULE | ORAL | Status: DC | PRN
  Administered 2022-10-05 – 2022-10-06 (×3): 2 mg via ORAL
  Filled 2022-10-05 (×3): qty 1

## 2022-10-05 MED ORDER — FUROSEMIDE 40 MG PO TABS
40.0000 mg | ORAL_TABLET | Freq: Every day | ORAL | Status: DC
  Administered 2022-10-06 – 2022-10-07 (×2): 40 mg via ORAL
  Filled 2022-10-05 (×2): qty 1

## 2022-10-05 MED ORDER — IOHEXOL 350 MG/ML SOLN
95.0000 mL | Freq: Once | INTRAVENOUS | Status: AC | PRN
  Administered 2022-10-05: 95 mL via INTRAVENOUS

## 2022-10-05 MED ORDER — METOPROLOL TARTRATE 5 MG/5ML IV SOLN
5.0000 mg | Freq: Once | INTRAVENOUS | Status: AC
  Administered 2022-10-05: 5 mg via INTRAVENOUS

## 2022-10-05 MED ORDER — NITROGLYCERIN 0.4 MG SL SUBL
0.8000 mg | SUBLINGUAL_TABLET | SUBLINGUAL | Status: DC | PRN
  Administered 2022-10-05: 0.8 mg via SUBLINGUAL

## 2022-10-05 MED ORDER — POTASSIUM CHLORIDE 10 MEQ/100ML IV SOLN
10.0000 meq | INTRAVENOUS | Status: DC
  Administered 2022-10-05: 10 meq via INTRAVENOUS
  Filled 2022-10-05: qty 100

## 2022-10-05 NOTE — Progress Notes (Signed)
Mobility Specialist - Progress Note   10/05/22 1104  Mobility  Activity Transferred from bed to chair  Level of Assistance Standby assist, set-up cues, supervision of patient - no hands on  Assistive Device None  Activity Response Tolerated well  Mobility Referral Yes  $Mobility charge 1 Mobility   Pt received in bed and agreed to transfer to recliner. Pt needed assistance removing pillows in bed, stand by for bed mobility and transfer using scoot method with recliner arm down.   Only assisting with lines and recliner pt scooted to recliner with all needs met.  Will return to assist and ensure pt is back in bed.  Marilynne Halsted Mobility Specialist

## 2022-10-05 NOTE — Progress Notes (Signed)
Coronary CTA showed coronary Ca score of 0 with no CAD and normal coronary origins. Mildly dilated ascending root at 4.1cm. No evidence of fluid overload in lungs on CTA.  Will transition to PO Lasix  daily starting tomorrow

## 2022-10-05 NOTE — Progress Notes (Signed)
Rounding Note    Patient Name: Samuel Becker Date of Encounter: 10/05/2022  [facility] Cardiologist: New to Dr Mayford Knife   Subjective   He feel better, reports having increased loose stool. Family report he had significant hypoxia yesterday during oxygen weaning. He denied SOB, chest pain, dizziness. He is tolerating metoprolol.   Inpatient Medications    Scheduled Meds:  Chlorhexidine Gluconate Cloth  6 each Topical Daily   cholecalciferol  400 Units Oral Daily   docusate sodium  100 mg Oral BID   feeding supplement  237 mL Oral BID BM   furosemide  40 mg Intravenous BID   ipratropium-albuterol  3 mL Nebulization BID   loratadine  10 mg Oral Daily   multivitamin with minerals  1 tablet Oral Daily   oxybutynin  15 mg Oral QHS   pantoprazole  40 mg Oral BID   sacubitril-valsartan  1 tablet Oral BID   sodium chloride flush  3 mL Intravenous Q12H   Continuous Infusions:  sodium chloride Stopped (10/03/22 1125)   potassium chloride     PRN Meds: sodium chloride, acetaminophen **OR** acetaminophen, guaiFENesin, hydrALAZINE, ipratropium-albuterol, ivabradine, metoprolol tartrate, metoprolol tartrate, ondansetron **OR** ondansetron (ZOFRAN) IV, mouth rinse, pneumococcal 20-valent conjugate vaccine, senna-docusate, sodium chloride flush, traZODone   Vital Signs    Vitals:   10/04/22 1920 10/04/22 2107 10/05/22 0434 10/05/22 0500  BP: 119/87  110/78   Pulse: 87  88   Resp: 18  16   Temp: 98.4 F (36.9 C)  98.2 F (36.8 C)   TempSrc: Oral  Oral   SpO2: 99% 98% 100%   Weight:    54.4 kg  Height:        Intake/Output Summary (Last 24 hours) at 10/05/2022 0754 Last data filed at 10/05/2022 0746 Gross per 24 hour  Intake 845 ml  Output 1650 ml  Net -805 ml      10/05/2022    5:00 AM 10/04/2022    6:00 AM 10/03/2022    4:35 AM  Last 3 Weights  Weight (lbs) 119 lb 14.9 oz 101 lb 13.6 oz 110 lb 7.2 oz  Weight (kg) 54.4 kg 46.2 kg 50.1 kg      Telemetry     Sinus rhythm 80-90s, occasional PVCs and PACs - Personally Reviewed  ECG    N/A today - Personally Reviewed  Physical Exam   GEN: No acute distress.   Neck: No JVD Cardiac: RRR, no murmurs, rubs, or gallops.  Respiratory: Lung sounds clear at base to auscultation bilaterally. On 2LNC. Speaks full sentence.  GI: Soft, nontender, non-distended  MS: No edema of bilateral lower legs and feet; BLE muscular atrophy noted Neuro:  Nonfocal  Psych: Normal affect   Labs    High Sensitivity Troponin:   Recent Labs  Lab 10/02/22 1848 10/02/22 2015 10/02/22 2305 10/03/22 0311 10/03/22 0702  TROPONINIHS 115* 143* 124* 86* 65*     Chemistry Recent Labs  Lab 10/02/22 1632 10/03/22 0311 10/04/22 0258 10/05/22 0545  NA 137 137 138 138  K 4.3 3.7 4.0 3.0*  CL 100 98 91* 88*  CO2 30 31 35* 38*  GLUCOSE 134* 102* 118* 95  BUN 21*  CREATININE 1.04 0.81 0.70 0.88  CALCIUM 8.4* 8.2* 9.1 9.1  MG 2.2 2.5* 2.3 2.1  PROT 6.7 6.1*  --   --   ALBUMIN 3.8 3.4*  --   --   AST 17 15  --   --  ALT 18 16  --   --   ALKPHOS 46 40  --   --   BILITOT 0.3 0.6  --   --   GFRNONAA >60 >60 >60 >60  ANIONGAP 7 8 12 12     Lipids No results for input(s): "CHOL", "TRIG", "HDL", "LABVLDL", "LDLCALC", "CHOLHDL" in the last 168 hours.  Hematology Recent Labs  Lab 10/03/22 0311 10/04/22 0258 10/05/22 0545  WBC 13.7* 11.4* 8.9  RBC 4.35 4.72 4.45  HGB 13.5 14.4 13.4  HCT 42.2 45.3 42.9  MCV 97.0 96.0 96.4  MCH 31.0 30.5 30.1  MCHC 32.0 31.8 31.2  RDW 12.7 12.4 12.8  PLT 227 228 240   Thyroid  Recent Labs  Lab 10/02/22 1632  TSH 0.704    BNP Recent Labs  Lab 10/02/22 1632  BNP 335.1*    DDimer  Recent Labs  Lab 10/02/22 2015  DDIMER <0.27     Radiology    ECHOCARDIOGRAM COMPLETE  Result Date: 10/03/2022    ECHOCARDIOGRAM REPORT   Patient Name:   Samuel Becker Date of Exam: 10/03/2022 Medical Rec #:  [id number]            Height:       64.0 in Accession #:     [id number]           Weight:       110.4 lb Date of Birth:  April 06, 1991             BSA:          1.520 m Patient Age:    31 years             BP:           112/87 mmHg Patient Gender: M                    HR:           99 bpm. Exam Location:  Inpatient Procedure: 2D Echo, Cardiac Doppler and Color Doppler Indications:    elevated troponin  History:        Patient has no prior history of Echocardiogram examinations.  Sonographer:    Mike Gip Referring Phys: [id number] ANASTASSIA DOUTOVA IMPRESSIONS  1. Left ventricular ejection fraction, by estimation, is 35 to 40%. The left ventricle has moderately decreased function. The left ventricle demonstrates global hypokinesis. Left ventricular diastolic parameters are indeterminate. Elevated left atrial pressure.  2. Right ventricular systolic function is mildly reduced. The right ventricular size is moderately enlarged.  3. Left atrial size was moderately dilated.  4. Right atrial size was moderately dilated.  5. The mitral valve was not well visualized. No evidence of mitral valve regurgitation. No evidence of mitral stenosis.  6. Siever's Bicuspid Valve with mild Sinus of Valsalva Dilation and R-L fusiona and raphe. The aortic valve is bicuspid. Aortic valve regurgitation is trivial.  7. Aortic dilatation noted. There is mild dilatation of the aortic root, measuring 43 mm.  8. The inferior vena cava is normal in size with greater than 50% respiratory variability, suggesting right atrial pressure of 3 mmHg. Comparison(s): No prior Echocardiogram. FINDINGS  Left Ventricle: Left ventricular ejection fraction, by estimation, is 35 to 40%. The left ventricle has moderately decreased function. The left ventricle demonstrates global hypokinesis. The left ventricular internal cavity size was normal in size. There is no left ventricular hypertrophy. Left ventricular diastolic parameters are indeterminate. Elevated left atrial pressure. Right Ventricle: The right ventricular size  is moderately enlarged. Right vetricular wall thickness was not well visualized. Right ventricular systolic function is mildly reduced. Left Atrium: Left atrial size was moderately dilated. Right Atrium: Right atrial size was moderately dilated. Pericardium: There is no evidence of pericardial effusion. Mitral Valve: The mitral valve was not well visualized. No evidence of mitral valve regurgitation. No evidence of mitral valve stenosis. Tricuspid Valve: The tricuspid valve is grossly normal. Tricuspid valve regurgitation is not demonstrated. No evidence of tricuspid stenosis. Aortic Valve: Siever's Bicuspid Valve with mild Sinus of Valsalva Dilation and R-L fusiona and raphe. The aortic valve is bicuspid. Aortic valve regurgitation is trivial. Pulmonic Valve: The pulmonic valve was normal in structure. Pulmonic valve regurgitation is not visualized. Aorta: Aortic dilatation noted. There is mild dilatation of the aortic root, measuring 43 mm. Pulmonary Artery: The pulmonary artery is of normal size. Venous: The inferior vena cava is normal in size with greater than 50% respiratory variability, suggesting right atrial pressure of 3 mmHg. IAS/Shunts: No atrial level shunt detected by color flow Doppler.  LEFT VENTRICLE PLAX 2D LVIDd:         3.40 cm     Diastology LVIDs:         3.00 cm     LV e' medial:    6.31 cm/s LV PW:         0.80 cm     LV E/e' medial:  9.6 LV IVS:        0.80 cm     LV e' lateral:   9.36 cm/s LVOT diam:     2.30 cm     LV E/e' lateral: 6.5 LV SV:         57 LV SV Index:   37 LVOT Area:     4.15 cm  LV Volumes (MOD) LV vol d, MOD A2C: 63.6 ml LV vol d, MOD A4C: 74.5 ml LV vol s, MOD A2C: 37.3 ml LV vol s, MOD A4C: 47.0 ml LV SV MOD A2C:     26.3 ml LV SV MOD A4C:     74.5 ml LV SV MOD BP:      28.2 ml RIGHT VENTRICLE             IVC RV Basal diam:  4.40 cm     IVC diam: 1.30 cm RV S prime:     18.30 cm/s TAPSE (M-mode): 1.8 cm LEFT ATRIUM             Index        RIGHT ATRIUM           Index  LA diam:        2.20 cm 1.45 cm/m   RA Area:     20.60 cm LA Vol (A2C):   32.9 ml 21.64 ml/m  RA Volume:   62.90 ml  41.38 ml/m LA Vol (A4C):   14.1 ml 9.28 ml/m LA Biplane Vol: 21.5 ml 14.14 ml/m  AORTIC VALVE LVOT Vmax:   71.10 cm/s LVOT Vmean:  51.400 cm/s LVOT VTI:    0.137 m  AORTA Ao Root diam: 4.30 cm Ao Asc diam:  3.40 cm MITRAL VALVE MV Area (PHT): 3.81 cm    SHUNTS MV Decel Time: 199 msec    Systemic VTI:  0.14 m MV E velocity: 60.40 cm/s  Systemic Diam: 2.30 cm MV A velocity: 48.80 cm/s MV E/A ratio:  1.24 Riley Lam MD Electronically signed by Riley Lam MD Signature Date/Time: 10/03/2022/11:09:25 AM  Final     Cardiac Studies   Echo from 10/03/22:   1. Left ventricular ejection fraction, by estimation, is 35 to 40%. The  left ventricle has moderately decreased function. The left ventricle  demonstrates global hypokinesis. Left ventricular diastolic parameters are  indeterminate. Elevated left atrial  pressure.   2. Right ventricular systolic function is mildly reduced. The right  ventricular size is moderately enlarged.   3. Left atrial size was moderately dilated.   4. Right atrial size was moderately dilated.   5. The mitral valve was not well visualized. No evidence of mitral valve  regurgitation. No evidence of mitral stenosis.   6. Siever's Bicuspid Valve with mild Sinus of Valsalva Dilation and R-L  fusiona and raphe. The aortic valve is bicuspid. Aortic valve  regurgitation is trivial.   7. Aortic dilatation noted. There is mild dilatation of the aortic root,  measuring 43 mm.   8. The inferior vena cava is normal in size with greater than 50%  respiratory variability, suggesting right atrial pressure of 3 mmHg.   Comparison(s): No prior Echocardiogram.   Patient Profile     32 y.o. male with PMH of spina bifida, paraplegia, neurogenic bladder, GERD who is currently admitted for acute hypoxic respiratory failure 2/2 pneumonia + newly  discovered CHF with decompensation. Echo from 10/03/22 showed LVEF 35-40%. Cardiology is consulted on 10/03/22 for newly diagnosed low EF CHF.   Assessment & Plan    Newly diagnosed acute systolic heart failure  Dilated cardiomyopathy  - presented with acute hypoxic respiratory failure requiring BIPAP, sudden onset SOB while taking shower, increased DOE and BLE edema over 2 weeks; had recent hospitalization until 09/26/22 for sepsis 2/2 CAP  - BNP 335, POA  - CXR with asymmetric bilateral airspace infiltrate, pulmonary edema vs infection, POA  - Echo from 10/03/22 showed LVEF 35-40%, global hypokinesis, indeterminate diastolic parameters, elevated LA pressure, mild reduced RV, mod LAE and RAE, bicuspid aortic valve with mild dilatation of the sinus of Valsalva, R-L fusion and raphe, and trivial AI  - Etiology remains unclear, possible viral or stress induced CM, low suspicion for ischemic CM given lacking risk factor and young age, serum and urine protein electrophoresis pending  - on IV Lasix 40mg  BID, Net - 5L and weight is down from 110 > 101.85 >119ibs ?? since admission, clinically remains euvolemic today, difficulty weaning off oxygen likely due to underlying neuromuscular disease, recommend transition diuresis to PO Lasix 40mg  daily today  - discussed CHF management and signs to alert, diet change  - GDMT: BP is overall stable, started Metoprolol XL 25mg  daily yesterday, BP tolerating and tachycardia improving, will add low dose Entresto 24/26mg  BID today  Elevated troponin  Family history of premature CAD - no historical angina symptoms  - Hs trop 115>143 >124> 86 >65 - EKG has non-specific T wave abnormalities of inferior and lateral leads since 4/3, some similar from 2022 - Echo as above - likely demand ischemia from CHF and infection, CAD can't be ruled out, gated CT recommended by Dr Mayford Knife today  Bicuspid aortic valve  - no significant aortic stenosis or regurgitation - need routine  Echo   Acute hypoxic respiratory failure  Health care associated pneumonia  Hypokalemia   Spina bifida with paraplegia and neurogenic bladder  GERD Diarrhea  - per primary team     For questions or updates, please contact  [facility] Please consult [email or web address] for contact info under  Signed, Cyndi Bender, NP  10/05/2022, 7:54 AM

## 2022-10-05 NOTE — Care Management Important Message (Signed)
Important Message  Patient Details IM Letter given Name: SLADEN PLANCARTE MRN: [id number] Date of Birth: [birth year]   Medicare Important Message Given:  Yes     Caren Macadam 10/05/2022, 2:39 PM

## 2022-10-05 NOTE — Progress Notes (Signed)
Jacklyn Shell [phone]

## 2022-10-05 NOTE — Progress Notes (Signed)
Mobility Specialist - Progress Note   10/05/22 1306  Mobility  Activity Ambulated with assistance in hallway  Level of Assistance Standby assist, set-up cues, supervision of patient - no hands on  Assistive Device None  Activity Response Tolerated well  Mobility Referral Yes  $Mobility charge 1 Mobility   Pt received in chair and requested assistance in transfer. Had no issues, returned to bed with all needs met and family in room.  Marilynne Halsted Mobility Specialist

## 2022-10-05 NOTE — Progress Notes (Signed)
PROGRESS NOTE  SHEM PLEMMONS ZOX:[id number] DOB: April 22, 1991   PCP: Moshe Cipro, NP  Patient is from: Home.  DOA: 10/02/2022 LOS: 3  Chief complaints Chief Complaint  Patient presents with   Respiratory Distress     Brief Narrative / Interim history: 32 year old with history of spina bifida, paraplegia, neurogenic bladder, GERD and recent hospitalization from 4/3-4/9 for hypoxic respiratory failure and sepsis due to RLL CAP for which he was treated with antibiotics return to the hospital with SOB and admitted for hypoxic hypercarbic respiratory failure in the setting of acute systolic CHF versus community-acquired pneumonia.  Initially started on IV Lasix, antibiotics and steroids.  TTE with LVEF of 35%.  Cardiology consulted and following.  Pneumonia felt to be less likely with negative procalcitonin.  Antibiotics and steroid discontinued.   Subjective: Seen and examined earlier this morning.  No major events overnight of this morning.  Feels "better".  He says he is breathing has improved and he slept better.  Patient's mother reports 2 episodes of diarrhea today.  Denies blood in the stool.  Patient denies abdominal pain.  Patient's mother, stepmother and father at bedside.  Objective: Vitals:   10/05/22 0434 10/05/22 0500 10/05/22 0831 10/05/22 1219  BP: 110/78   106/84  Pulse: 88   97  Resp: 16   17  Temp: 98.2 F (36.8 C)   98.2 F (36.8 C)  TempSrc: Oral   Oral  SpO2: 100%  97% 99%  Weight:  54.4 kg    Height:        Examination:  GENERAL: No apparent distress.  Nontoxic. HEENT: MMM.  Vision and hearing grossly intact.  NECK: Supple.  No apparent JVD.  RESP:  No IWOB.  Fair aeration bilaterally. CVS:  RRR. Heart sounds normal.  ABD/GI/GU: BS+. Abd soft.  Abdomen slightly distended.  Nontender. MSK/EXT: Moves upper extremities.  BLE deformity and atrophy. SKIN: Reportedly had pressure skin injury. NEURO: Awake, alert and oriented appropriately.  No  apparent focal neuro deficit. PSYCH: Calm. Normal affect.   Procedures:  None  Microbiology summarized: COVID-19 and full RVP nonreactive. Urine cultures NGTD Blood cultures NGTD   Assessment and plan: Principal Problem:   Acute exacerbation of CHF (congestive heart failure) Active Problems:   Acute respiratory failure with hypoxemia   CAP (community acquired pneumonia)   Elevated troponin   Spina bifida   Paraplegia   Pressure injury of skin   Dilated cardiomyopathy   Acute systolic CHF (congestive heart failure)   Bicuspid aortic valve   Dilated aortic root  Acute respiratory failure with hypoxia/hypercapnia: VBG 7 point 3/65/35/32 suggesting chronic respiratory acidosis.  Felt to be due to CHF.  Pneumonia ruled out.  Improved.  Currently requiring 2 L to maintain saturation.   -Appreciate input by PCCM and cardiology -Wean oxygen as able -Continue bronchodilators. -Manage CHF as below.   Acute HFrEF/dilated CM: New diagnosis.  TTE with LVEF of 35 to 40%, GH, indeterminate DD, moderate LAE, RAE and bicuspid AV.  BNP 335.  -Cardiology on board-on IV Lasix and coronary CT -Strict intake and output, daily weights, renal functions and electrolytes   Community-acquired pneumonia: Ruled out.  Recently hospitalized and treated for this.   Spina bifida Paraplegia with neurogenic bladder -Continue Foley and home oxybutynin.   GERD -Continue PPI   Chronic constipation: Now with diarrhea likely from antibiotics, Ensure,. -Monitor  Hypokalemia -P.o. KCl 40 x 2  Increased nutrient needs Body mass index is 20.59 kg/m. Nutrition Problem: Increased  nutrient needs Etiology: acute illness Signs/Symptoms: estimated needs Interventions: Refer to RD note for recommendations, Ensure Enlive (each supplement provides 350kcal and 20 grams of protein), MVI  Pressure skin injury: POA Pressure Injury 10/03/22 Vertebral column Lower;Mid Stage 2 -  Partial thickness loss of dermis  presenting as a shallow open injury with a red, pink wound bed without slough. Pressure injury, advanced stage of healing (Active)  10/03/22 0045  Location: Vertebral column  Location Orientation: Lower;Mid  Staging: Stage 2 -  Partial thickness loss of dermis presenting as a shallow open injury with a red, pink wound bed without slough.  Wound Description (Comments): Pressure injury, advanced stage of healing  Present on Admission: Yes  Dressing Type Foam - Lift dressing to assess site every shift 10/05/22 0832   DVT prophylaxis:  SCDs Start: 10/02/22 1934  Code Status: Full code Family Communication: Updated patient's mother, father and stepmother at bedside Level of care: Telemetry Status is: Inpatient Remains inpatient appropriate because: Respiratory failure and CHF   Final disposition: Likely home Consultants:  Cardiology Pulmonology  35 minutes with more than 50% spent in reviewing records, counseling patient/family and coordinating care.   Sch Meds:  Scheduled Meds:  Chlorhexidine Gluconate Cloth  6 each Topical Daily   cholecalciferol  400 Units Oral Daily   docusate sodium  100 mg Oral BID   feeding supplement  237 mL Oral BID BM   furosemide  40 mg Intravenous BID   ipratropium-albuterol  3 mL Nebulization BID   loratadine  10 mg Oral Daily   multivitamin with minerals  1 tablet Oral Daily   oxybutynin  15 mg Oral QHS   pantoprazole  40 mg Oral BID   potassium chloride  40 mEq Oral Q4H   sodium chloride flush  3 mL Intravenous Q12H   Continuous Infusions:  sodium chloride Stopped (10/03/22 1125)   PRN Meds:.sodium chloride, acetaminophen **OR** acetaminophen, guaiFENesin, hydrALAZINE, ipratropium-albuterol, metoprolol tartrate, ondansetron **OR** ondansetron (ZOFRAN) IV, mouth rinse, pneumococcal 20-valent conjugate vaccine, senna-docusate, sodium chloride flush, traZODone  Antimicrobials: Anti-infectives (From admission, onward)    Start     Dose/Rate  Route Frequency Ordered Stop   10/03/22 1600  vancomycin (VANCOREADY) IVPB 750 mg/150 mL  Status:  Discontinued        750 mg 150 mL/hr over 60 Minutes Intravenous Every 24 hours 10/02/22 2009 10/03/22 0721   10/03/22 1000  cefTRIAXone (ROCEPHIN) 1 g in sodium chloride 0.9 % 100 mL IVPB  Status:  Discontinued        1 g 200 mL/hr over 30 Minutes Intravenous Every 24 hours 10/03/22 0722 10/04/22 0944   10/03/22 0815  azithromycin (ZITHROMAX) 250 mg in dextrose 5 % 125 mL IVPB  Status:  Discontinued        250 mg 127.5 mL/hr over 60 Minutes Intravenous Every 24 hours 10/03/22 0722 10/03/22 0734   10/03/22 0800  azithromycin (ZITHROMAX) 500 mg in sodium chloride 0.9 % 250 mL IVPB  Status:  Discontinued        500 mg 250 mL/hr over 60 Minutes Intravenous Every 24 hours 10/03/22 0735 10/04/22 0944   10/03/22 0200  ceFEPIme (MAXIPIME) 2 g in sodium chloride 0.9 % 100 mL IVPB  Status:  Discontinued        2 g 200 mL/hr over 30 Minutes Intravenous Every 8 hours 10/02/22 2011 10/03/22 0722   10/02/22 1645  vancomycin (VANCOCIN) IVPB 1000 mg/200 mL premix        1,000 mg 200  mL/hr over 60 Minutes Intravenous  Once 10/02/22 1640 10/02/22 1822   10/02/22 1645  ceFEPIme (MAXIPIME) 2 g in sodium chloride 0.9 % 100 mL IVPB        2 g 200 mL/hr over 30 Minutes Intravenous  Once 10/02/22 1640 10/02/22 1754        I have personally reviewed the following labs and images: CBC: Recent Labs  Lab 10/02/22 1632 10/03/22 0311 10/04/22 0258 10/05/22 0545  WBC 15.7* 13.7* 11.4* 8.9  NEUTROABS 13.5*  --   --   --   HGB 14.7 13.5 14.4 13.4  HCT 46.9 42.2 45.3 42.9  MCV 97.9 97.0 96.0 96.4  PLT 290 227 228 240   BMP &GFR Recent Labs  Lab 10/02/22 1632 10/02/22 2015 10/03/22 0311 10/04/22 0258 10/05/22 0545  NA 137  --  137 138 138  K 4.3  --  3.7 4.0 3.0*  CL 100  --  98 91* 88*  CO2 30  --  31 35* 38*  GLUCOSE 134*  --  102* 118* 95  BUN 11  --  10 18 21*  CREATININE 1.04  --  0.81 0.70  0.88  CALCIUM 8.4*  --  8.2* 9.1 9.1  MG 2.2  --  2.5* 2.3 2.1  PHOS  --  4.7* 4.8*  --   --    Estimated Creatinine Clearance: 92.7 mL/min (by C-G formula based on SCr of 0.88 mg/dL). Liver & Pancreas: Recent Labs  Lab 10/02/22 1632 10/03/22 0311  AST 17 15  ALT 18 16  ALKPHOS 46 40  BILITOT 0.3 0.6  PROT 6.7 6.1*  ALBUMIN 3.8 3.4*   No results for input(s): "LIPASE", "AMYLASE" in the last 168 hours. No results for input(s): "AMMONIA" in the last 168 hours. Diabetic: No results for input(s): "HGBA1C" in the last 72 hours. Recent Labs  Lab 10/02/22 1636  GLUCAP 121*   Cardiac Enzymes: No results for input(s): "CKTOTAL", "CKMB", "CKMBINDEX", "TROPONINI" in the last 168 hours. No results for input(s): "PROBNP" in the last 8760 hours. Coagulation Profile: Recent Labs  Lab 10/02/22 1632  INR 1.1   Thyroid Function Tests: Recent Labs    10/02/22 1632  TSH 0.704   Lipid Profile: No results for input(s): "CHOL", "HDL", "LDLCALC", "TRIG", "CHOLHDL", "LDLDIRECT" in the last 72 hours. Anemia Panel: Recent Labs    10/03/22 1506  FERRITIN 35   Urine analysis:    Component Value Date/Time   COLORURINE YELLOW 10/02/2022 1640   APPEARANCEUR HAZY (A) 10/02/2022 1640   LABSPEC 1.028 10/02/2022 1640   PHURINE 5.0 10/02/2022 1640   GLUCOSEU NEGATIVE 10/02/2022 1640   HGBUR NEGATIVE 10/02/2022 1640   BILIRUBINUR NEGATIVE 10/02/2022 1640   KETONESUR NEGATIVE 10/02/2022 1640   PROTEINUR 100 (A) 10/02/2022 1640   UROBILINOGEN 0.2 02/14/2009 1924   NITRITE NEGATIVE 10/02/2022 1640   LEUKOCYTESUR NEGATIVE 10/02/2022 1640   Sepsis Labs: Invalid input(s): "PROCALCITONIN", "LACTICIDVEN"  Microbiology: Recent Results (from the past 240 hour(s))  Urine Culture (for pregnant, neutropenic or urologic patients or patients with an indwelling urinary catheter)     Status: None   Collection Time: 10/02/22  4:40 PM   Specimen: Urine, Clean Catch  Result Value Ref Range Status    Specimen Description   Final    URINE, CLEAN CATCH Performed at [facility], [address] [place], [place] [address]    Special Requests   Final    NONE Performed at [facility], [address] [place], [place] [address]    Culture   Final    NO GROWTH Performed at [facility], [address], [place], [place] [address]    Report Status 10/03/2022 FINAL  Final  SARS Coronavirus 2 by RT PCR (hospital order, performed in [facility] hospital lab) *cepheid single result test*     Status: None   Collection Time: 10/02/22  4:58 PM  Result Value Ref Range Status   SARS Coronavirus 2 by RT PCR NEGATIVE NEGATIVE Final    Comment: (NOTE) SARS-CoV-2 target nucleic acids are NOT DETECTED.  The SARS-CoV-2 RNA is generally detectable in upper and lower respiratory specimens during the acute phase of infection. The lowest concentration of SARS-CoV-2 viral copies this assay can detect is 250 copies / mL. A negative result does not preclude SARS-CoV-2 infection and should not be used as the sole basis for treatment or other patient management decisions.  A negative result may occur with improper specimen collection / handling, submission of specimen other than nasopharyngeal swab, presence of viral mutation(s) within the areas targeted by this assay, and inadequate number of viral copies (<250 copies / mL). A negative result must be combined with clinical observations, patient history, and epidemiological information.  Fact Sheet for Patients:   RoadLapTop.co.za  Fact Sheet for Healthcare Providers: http://kim-miller.com/  This test is not yet approved or  cleared by the [place] FDA and has been authorized for detection and/or diagnosis of SARS-CoV-2 by FDA under an Emergency Use Authorization (EUA).  This EUA will remain in effect (meaning this test can be used) for the duration of  the COVID-19 declaration under Section 564(b)(1) of the Act, 21 U.S.C. section 360bbb-3(b)(1), unless the authorization is terminated or revoked sooner.  Performed at [facility], [address], [place], [place] [address]   Respiratory (~20 pathogens) panel by PCR     Status: None   Collection Time: 10/02/22  4:58 PM   Specimen: Urine, Clean Catch; Respiratory  Result Value Ref Range Status   Adenovirus NOT DETECTED NOT DETECTED Final   Coronavirus 229E NOT DETECTED NOT DETECTED Final    Comment: (NOTE) The Coronavirus on the Respiratory Panel, DOES NOT test for the novel  Coronavirus (2019 nCoV)    Coronavirus HKU1 NOT DETECTED NOT DETECTED Final   Coronavirus NL63 NOT DETECTED NOT DETECTED Final   Coronavirus OC43 NOT DETECTED NOT DETECTED Final   Metapneumovirus NOT DETECTED NOT DETECTED Final   Rhinovirus / Enterovirus NOT DETECTED NOT DETECTED Final   Influenza A NOT DETECTED NOT DETECTED Final   Influenza B NOT DETECTED NOT DETECTED Final   Parainfluenza Virus 1 NOT DETECTED NOT DETECTED Final   Parainfluenza Virus 2 NOT DETECTED NOT DETECTED Final   Parainfluenza Virus 3 NOT DETECTED NOT DETECTED Final   Parainfluenza Virus 4 NOT DETECTED NOT DETECTED Final   Respiratory Syncytial Virus NOT DETECTED NOT DETECTED Final   Bordetella pertussis NOT DETECTED NOT DETECTED Final   Bordetella Parapertussis NOT DETECTED NOT DETECTED Final   Chlamydophila pneumoniae NOT DETECTED NOT DETECTED Final   Mycoplasma pneumoniae NOT DETECTED NOT DETECTED Final    Comment: Performed at [facility], [address] [place], [place] [address]  Blood Culture (routine x 2)     Status: None (Preliminary result)   Collection Time: 10/02/22  5:00 PM   Specimen: BLOOD  Result Value Ref Range Status   Specimen Description   Final    BLOOD RIGHT  ANTECUBITAL Performed at [facility], [address], [place], [place] [address]    Special Requests    Final    BOTTLES DRAWN AEROBIC AND ANAEROBIC Blood Culture results may not be optimal due to an excessive volume of blood received in culture bottles Performed at [facility], [address] [place], [place] [address]    Culture   Final    NO GROWTH 3 DAYS Performed at [facility], [address], [place], [place] [address]    Report Status PENDING  Incomplete  Blood Culture (routine x 2)     Status: None (Preliminary result)   Collection Time: 10/02/22  5:22 PM   Specimen: BLOOD  Result Value Ref Range Status   Specimen Description   Final    BLOOD BLOOD RIGHT HAND Performed at [facility], [address] [place], [place] [address]    Special Requests   Final    BOTTLES DRAWN AEROBIC ONLY Blood Culture adequate volume Performed at [facility], [address], [place], [place] [address]    Culture   Final    NO GROWTH 3 DAYS Performed at [facility], [address], [place], [place] [address]    Report Status PENDING  Incomplete  MRSA Next Gen by PCR, Nasal     Status: None   Collection Time: 10/02/22 11:05 PM   Specimen: Nasal Mucosa; Nasal Swab  Result Value Ref Range Status   MRSA by PCR Next Gen NOT DETECTED NOT DETECTED Final    Comment: (NOTE) The GeneXpert MRSA Assay (FDA approved for NASAL specimens only), is one component of a comprehensive MRSA colonization surveillance program. It is not intended to diagnose MRSA infection nor to guide or monitor treatment for MRSA infections. Test performance is not FDA approved in patients less than 9 years old. Performed at [facility], [address] [place], [place] [address]     Radiology Studies: No results found.    Jenaro Souder T. Raun Routh [facility]  If 7PM-7AM, please contact night-coverage [email or web address] 10/05/2022, 12:51 PM

## 2022-10-06 ENCOUNTER — Encounter (HOSPITAL_COMMUNITY): Payer: Self-pay | Admitting: Internal Medicine

## 2022-10-06 DIAGNOSIS — I5023 Acute on chronic systolic (congestive) heart failure: Secondary | ICD-10-CM | POA: Diagnosis not present

## 2022-10-06 DIAGNOSIS — I420 Dilated cardiomyopathy: Secondary | ICD-10-CM | POA: Diagnosis not present

## 2022-10-06 DIAGNOSIS — I5021 Acute systolic (congestive) heart failure: Secondary | ICD-10-CM | POA: Diagnosis not present

## 2022-10-06 DIAGNOSIS — Q231 Congenital insufficiency of aortic valve: Secondary | ICD-10-CM | POA: Diagnosis not present

## 2022-10-06 DIAGNOSIS — R7989 Other specified abnormal findings of blood chemistry: Secondary | ICD-10-CM | POA: Diagnosis not present

## 2022-10-06 LAB — CBC
HCT: 45.3 % (ref 39.0–52.0)
Hemoglobin: 14.4 g/dL (ref 13.0–17.0)
MCH: 30.7 pg (ref 26.0–34.0)
MCHC: 31.8 g/dL (ref 30.0–36.0)
MCV: 96.6 fL (ref 80.0–100.0)
Platelets: 234 10*3/uL (ref 150–400)
RBC: 4.69 MIL/uL (ref 4.22–5.81)
RDW: 12.6 % (ref 11.5–15.5)
WBC: 8.1 10*3/uL (ref 4.0–10.5)
nRBC: 0 % (ref 0.0–0.2)

## 2022-10-06 LAB — PROTEIN ELECTROPHORESIS, SERUM
A/G Ratio: 1.4 (ref 0.7–1.7)
Albumin ELP: 3.8 g/dL (ref 2.9–4.4)
Alpha-1-Globulin: 0.3 g/dL (ref 0.0–0.4)
Alpha-2-Globulin: 0.9 g/dL (ref 0.4–1.0)
Beta Globulin: 0.9 g/dL (ref 0.7–1.3)
Gamma Globulin: 0.8 g/dL (ref 0.4–1.8)
Globulin, Total: 2.8 g/dL (ref 2.2–3.9)
Total Protein ELP: 6.6 g/dL (ref 6.0–8.5)

## 2022-10-06 LAB — BASIC METABOLIC PANEL
Anion gap: 11 (ref 5–15)
BUN: 20 mg/dL (ref 6–20)
CO2: 34 mmol/L — ABNORMAL HIGH (ref 22–32)
Calcium: 9.3 mg/dL (ref 8.9–10.3)
Chloride: 91 mmol/L — ABNORMAL LOW (ref 98–111)
Creatinine, Ser: 0.79 mg/dL (ref 0.61–1.24)
GFR, Estimated: >60 mL/min (ref >60)
Glucose, Bld: 94 mg/dL (ref 70–99)
Potassium: 4.4 mmol/L (ref 3.5–5.1)
Sodium: 136 mmol/L (ref 135–145)

## 2022-10-06 LAB — CULTURE, BLOOD (ROUTINE X 2)

## 2022-10-06 LAB — MAGNESIUM: Magnesium: 2.6 mg/dL — ABNORMAL HIGH (ref 1.7–2.4)

## 2022-10-06 MED ORDER — SACUBITRIL-VALSARTAN 24-26 MG PO TABS
1.0000 | ORAL_TABLET | Freq: Two times a day (BID) | ORAL | Status: DC
  Administered 2022-10-06: 1 via ORAL
  Filled 2022-10-06 (×4): qty 1

## 2022-10-06 NOTE — Progress Notes (Signed)
Home oxygen determination: pt was placed on room air @ 0805 per RN request,  @ 0820, Sp02 78%, RT applied [place] @ 2 Lpm, Sp02 increased to 100% within 1 minute, family at the bedside, informed RN.

## 2022-10-06 NOTE — Progress Notes (Signed)
PROGRESS NOTE  Samuel Becker:[id number] DOB: 11/24/1990   PCP: Moshe Cipro, NP  Patient is from: Home.  DOA: 10/02/2022 LOS: 4  Chief complaints Chief Complaint  Patient presents with   Respiratory Distress     Brief Narrative / Interim history: 32 year old with history of spina bifida, paraplegia, neurogenic bladder, GERD and recent hospitalization from 4/3-4/9 for hypoxic respiratory failure and sepsis due to RLL CAP for which he was treated with antibiotics return to the hospital with SOB and admitted for hypoxic hypercarbic respiratory failure in the setting of acute systolic CHF versus community-acquired pneumonia.  Initially started on IV Lasix, antibiotics and steroids.  TTE with LVEF of 35%.  Cardiology consulted.  CT coronary without CAD or significant calcification but concerning for RLL atelectasis.  Transition to p.o. Lasix.  Cardiology titrating GDMT.  Pneumonia felt to be less likely with negative procalcitonin.  Antibiotics and steroid discontinued.  Pulmonology to arrange outpatient follow-up.  Patient continues to require 2 L at rest.   Subjective: Seen and examined earlier this morning.  No major events overnight of this morning.  Has no complaints.  Sitting on bedside chair.  Desaturated to 78% on room air.  Continues to require 2 L.  Diarrhea improved.  Objective: Vitals:   10/05/22 2007 10/05/22 2013 10/06/22 0458 10/06/22 0756  BP: 110/77  112/77   Pulse: 80  79   Resp: 17  18   Temp: 98.3 F (36.8 C)  98.2 F (36.8 C)   TempSrc:   Oral   SpO2: 99% 97% 100% 99%  Weight:   51.2 kg   Height:        Examination:  GENERAL: No apparent distress.  Nontoxic. HEENT: MMM.  Vision and hearing grossly intact.  NECK: Supple.  No apparent JVD.  RESP:  No IWOB.  Fair aeration bilaterally. CVS:  RRR. Heart sounds normal.  ABD/GI/GU: BS+. Abd soft, NTND.  MSK/EXT:   BLE deformity and atrophy.  Deformities in the scars from old back  surgeries. SKIN: no apparent skin lesion or wound NEURO: Awake and alert. Oriented appropriately.  No apparent focal neurodeficit. PSYCH: Calm. Normal affect.   Procedures:  None  Microbiology summarized: COVID-19 and full RVP nonreactive. Urine cultures NGTD Blood cultures NGTD   Assessment and plan: Principal Problem:   Acute exacerbation of CHF (congestive heart failure) Active Problems:   Acute hypoxemic respiratory failure   CAP (community acquired pneumonia)   Elevated troponin   Spina bifida   Paraplegia   Pressure injury of skin   Dilated cardiomyopathy   Acute systolic CHF (congestive heart failure)   Bicuspid aortic valve   Dilated aortic root   Hypokalemia  Acute respiratory failure with hypoxia/hypercapnia: VBG 7 point 3/65/35/32 suggesting chronic respiratory acidosis.  Felt to be due to CHF.  Pneumonia ruled out.  Improved.  Currently requiring 2 L to maintain saturation.   -Appreciate input by PCCM and cardiology -Continue bronchodilators. -Manage CHF as below. -Encourage incentive spirometry -He may need 2 L oxygen on discharge.   Acute HFrEF/dilated CM: New diagnosis.  TTE with LVEF of 35 to 40%, GH, indeterminate DD, moderate LAE, RAE and bicuspid AV.  BNP 335.  Coronary CTA with calcium score of 0 and no underlying CAD. -Cardiology on board-transition to p.o. Lasix -GDMT-cardiology started [employer]. -Strict intake and output, daily weights, renal functions and electrolytes   Community-acquired pneumonia: Ruled out.  Recently hospitalized and treated for this.   Spina bifida Paraplegia with neurogenic bladder -Continue  Foley and home oxybutynin.   GERD -Continue PPI   Chronic constipation: Now with diarrhea likely from antibiotics, Ensure,. -Monitor  Hypokalemia -P.o. KCl 40 x 2  Diarrhea: Likely due to antibiotics.  Resolving.  Increased nutrient needs Body mass index is 19.38 kg/m. Nutrition Problem: Increased nutrient needs Etiology:  acute illness Signs/Symptoms: estimated needs Interventions: Refer to RD note for recommendations, Ensure Enlive (each supplement provides 350kcal and 20 grams of protein), MVI  Pressure skin injury: POA Pressure Injury 10/03/22 Vertebral column Lower;Mid Stage 2 -  Partial thickness loss of dermis presenting as a shallow open injury with a red, pink wound bed without slough. Pressure injury, advanced stage of healing (Active)  10/03/22 0045  Location: Vertebral column  Location Orientation: Lower;Mid  Staging: Stage 2 -  Partial thickness loss of dermis presenting as a shallow open injury with a red, pink wound bed without slough.  Wound Description (Comments): Pressure injury, advanced stage of healing  Present on Admission: Yes  Dressing Type Foam - Lift dressing to assess site every shift 10/05/22 0832   DVT prophylaxis:  SCDs Start: 10/02/22 1934  Code Status: Full code Family Communication: Updated patient's mother and mother-in-law at bedside. Level of care: Telemetry Status is: Inpatient Remains inpatient appropriate because: Respiratory failure and CHF   Final disposition: Likely home Consultants:  Cardiology Pulmonology  35 minutes with more than 50% spent in reviewing records, counseling patient/family and coordinating care.   Sch Meds:  Scheduled Meds:  Chlorhexidine Gluconate Cloth  6 each Topical Daily   cholecalciferol  400 Units Oral Daily   docusate sodium  100 mg Oral BID   feeding supplement  237 mL Oral BID BM   furosemide  40 mg Oral Daily   ipratropium-albuterol  3 mL Nebulization BID   loratadine  10 mg Oral Daily   multivitamin with minerals  1 tablet Oral Daily   oxybutynin  15 mg Oral QHS   pantoprazole  40 mg Oral BID   sacubitril-valsartan  1 tablet Oral BID   sodium chloride flush  3 mL Intravenous Q12H   Continuous Infusions:  sodium chloride Stopped (10/03/22 1125)   PRN Meds:.sodium chloride, acetaminophen **OR** acetaminophen,  guaiFENesin, hydrALAZINE, ipratropium-albuterol, loperamide, metoprolol tartrate, nitroGLYCERIN, ondansetron **OR** ondansetron (ZOFRAN) IV, mouth rinse, pneumococcal 20-valent conjugate vaccine, senna-docusate, sodium chloride flush, traZODone  Antimicrobials: Anti-infectives (From admission, onward)    Start     Dose/Rate Route Frequency Ordered Stop   10/03/22 1600  vancomycin (VANCOREADY) IVPB 750 mg/150 mL  Status:  Discontinued        750 mg 150 mL/hr over 60 Minutes Intravenous Every 24 hours 10/02/22 2009 10/03/22 0721   10/03/22 1000  cefTRIAXone (ROCEPHIN) 1 g in sodium chloride 0.9 % 100 mL IVPB  Status:  Discontinued        1 g 200 mL/hr over 30 Minutes Intravenous Every 24 hours 10/03/22 0722 10/04/22 0944   10/03/22 0815  azithromycin (ZITHROMAX) 250 mg in dextrose 5 % 125 mL IVPB  Status:  Discontinued        250 mg 127.5 mL/hr over 60 Minutes Intravenous Every 24 hours 10/03/22 0722 10/03/22 0734   10/03/22 0800  azithromycin (ZITHROMAX) 500 mg in sodium chloride 0.9 % 250 mL IVPB  Status:  Discontinued        500 mg 250 mL/hr over 60 Minutes Intravenous Every 24 hours 10/03/22 0735 10/04/22 0944   10/03/22 0200  ceFEPIme (MAXIPIME) 2 g in sodium chloride 0.9 % 100 mL  IVPB  Status:  Discontinued        2 g 200 mL/hr over 30 Minutes Intravenous Every 8 hours 10/02/22 2011 10/03/22 0722   10/02/22 1645  vancomycin (VANCOCIN) IVPB 1000 mg/200 mL premix        1,000 mg 200 mL/hr over 60 Minutes Intravenous  Once 10/02/22 1640 10/02/22 1822   10/02/22 1645  ceFEPIme (MAXIPIME) 2 g in sodium chloride 0.9 % 100 mL IVPB        2 g 200 mL/hr over 30 Minutes Intravenous  Once 10/02/22 1640 10/02/22 1754        I have personally reviewed the following labs and images: CBC: Recent Labs  Lab 10/02/22 1632 10/03/22 0311 10/04/22 0258 10/05/22 0545 10/06/22 0726  WBC 15.7* 13.7* 11.4* 8.9 8.1  NEUTROABS 13.5*  --   --   --   --   HGB 14.7 13.5 14.4 13.4 14.4  HCT 46.9  42.2 45.3 42.9 45.3  MCV 97.9 97.0 96.0 96.4 96.6  PLT 290 227 228 240 234   BMP &GFR Recent Labs  Lab 10/02/22 1632 10/02/22 2015 10/03/22 0311 10/04/22 0258 10/05/22 0545 10/06/22 0511  NA 137  --  137 138 138 136  K 4.3  --  3.7 4.0 3.0* 4.4  CL 100  --  98 91* 88* 91*  CO2 30  --  31 35* 38* 34*  GLUCOSE 134*  --  102* 118* 95 94  BUN 11  --  10 18 21* 20  CREATININE 1.04  --  0.81 0.70 0.88 0.79  CALCIUM 8.4*  --  8.2* 9.1 9.1 9.3  MG 2.2  --  2.5* 2.3 2.1 2.6*  PHOS  --  4.7* 4.8*  --   --   --    Estimated Creatinine Clearance: 96 mL/min (by C-G formula based on SCr of 0.79 mg/dL). Liver & Pancreas: Recent Labs  Lab 10/02/22 1632 10/03/22 0311  AST 17 15  ALT 18 16  ALKPHOS 46 40  BILITOT 0.3 0.6  PROT 6.7 6.1*  ALBUMIN 3.8 3.4*   No results for input(s): "LIPASE", "AMYLASE" in the last 168 hours. No results for input(s): "AMMONIA" in the last 168 hours. Diabetic: No results for input(s): "HGBA1C" in the last 72 hours. Recent Labs  Lab 10/02/22 1636  GLUCAP 121*   Cardiac Enzymes: No results for input(s): "CKTOTAL", "CKMB", "CKMBINDEX", "TROPONINI" in the last 168 hours. No results for input(s): "PROBNP" in the last 8760 hours. Coagulation Profile: Recent Labs  Lab 10/02/22 1632  INR 1.1   Thyroid Function Tests: No results for input(s): "TSH", "T4TOTAL", "FREET4", "T3FREE", "THYROIDAB" in the last 72 hours.  Lipid Profile: No results for input(s): "CHOL", "HDL", "LDLCALC", "TRIG", "CHOLHDL", "LDLDIRECT" in the last 72 hours. Anemia Panel: Recent Labs    10/03/22 1506  FERRITIN 35   Urine analysis:    Component Value Date/Time   COLORURINE YELLOW 10/02/2022 1640   APPEARANCEUR HAZY (A) 10/02/2022 1640   LABSPEC 1.028 10/02/2022 1640   PHURINE 5.0 10/02/2022 1640   GLUCOSEU NEGATIVE 10/02/2022 1640   HGBUR NEGATIVE 10/02/2022 1640   BILIRUBINUR NEGATIVE 10/02/2022 1640   KETONESUR NEGATIVE 10/02/2022 1640   PROTEINUR 100 (A)  10/02/2022 1640   UROBILINOGEN 0.2 02/14/2009 1924   NITRITE NEGATIVE 10/02/2022 1640   LEUKOCYTESUR NEGATIVE 10/02/2022 1640   Sepsis Labs: Invalid input(s): "PROCALCITONIN", "LACTICIDVEN"  Microbiology: Recent Results (from the past 240 hour(s))  Urine Culture (for pregnant, neutropenic or urologic patients or patients  with an indwelling urinary catheter)     Status: None   Collection Time: 10/02/22  4:40 PM   Specimen: Urine, Clean Catch  Result Value Ref Range Status   Specimen Description   Final    URINE, CLEAN CATCH Performed at [facility], [address], [place], [place] [address]    Special Requests   Final    NONE Performed at [facility], [address], [place], [place] [address]    Culture   Final    NO GROWTH Performed at [facility], [address], [place], [place] [address]    Report Status 10/03/2022 FINAL  Final  SARS Coronavirus 2 by RT PCR (hospital order, performed in [facility] hospital lab) *cepheid single result test*     Status: None   Collection Time: 10/02/22  4:58 PM  Result Value Ref Range Status   SARS Coronavirus 2 by RT PCR NEGATIVE NEGATIVE Final    Comment: (NOTE) SARS-CoV-2 target nucleic acids are NOT DETECTED.  The SARS-CoV-2 RNA is generally detectable in upper and lower respiratory specimens during the acute phase of infection. The lowest concentration of SARS-CoV-2 viral copies this assay can detect is 250 copies / mL. A negative result does not preclude SARS-CoV-2 infection and should not be used as the sole basis for treatment or other patient management decisions.  A negative result may occur with improper specimen collection / handling, submission of specimen other than nasopharyngeal swab, presence of viral mutation(s) within the areas targeted by this assay, and inadequate number of viral copies (<250 copies / mL). A negative result must be combined with clinical observations,  patient history, and epidemiological information.  Fact Sheet for Patients:   RoadLapTop.co.za  Fact Sheet for Healthcare Providers: http://kim-miller.com/  This test is not yet approved or  cleared by the [place] FDA and has been authorized for detection and/or diagnosis of SARS-CoV-2 by FDA under an Emergency Use Authorization (EUA).  This EUA will remain in effect (meaning this test can be used) for the duration of the COVID-19 declaration under Section 564(b)(1) of the Act, 21 U.S.C. section 360bbb-3(b)(1), unless the authorization is terminated or revoked sooner.  Performed at [facility], [address], [place], [place] [address]   Respiratory (~20 pathogens) panel by PCR     Status: None   Collection Time: 10/02/22  4:58 PM   Specimen: Urine, Clean Catch; Respiratory  Result Value Ref Range Status   Adenovirus NOT DETECTED NOT DETECTED Final   Coronavirus 229E NOT DETECTED NOT DETECTED Final    Comment: (NOTE) The Coronavirus on the Respiratory Panel, DOES NOT test for the novel  Coronavirus (2019 nCoV)    Coronavirus HKU1 NOT DETECTED NOT DETECTED Final   Coronavirus NL63 NOT DETECTED NOT DETECTED Final   Coronavirus OC43 NOT DETECTED NOT DETECTED Final   Metapneumovirus NOT DETECTED NOT DETECTED Final   Rhinovirus / Enterovirus NOT DETECTED NOT DETECTED Final   Influenza A NOT DETECTED NOT DETECTED Final   Influenza B NOT DETECTED NOT DETECTED Final   Parainfluenza Virus 1 NOT DETECTED NOT DETECTED Final   Parainfluenza Virus 2 NOT DETECTED NOT DETECTED Final   Parainfluenza Virus 3 NOT DETECTED NOT DETECTED Final   Parainfluenza Virus 4 NOT DETECTED NOT DETECTED Final   Respiratory Syncytial Virus NOT DETECTED NOT DETECTED Final   Bordetella pertussis NOT DETECTED NOT DETECTED Final   Bordetella Parapertussis NOT DETECTED NOT DETECTED Final   Chlamydophila  pneumoniae NOT DETECTED NOT DETECTED  Final   Mycoplasma pneumoniae NOT DETECTED NOT DETECTED Final    Comment: Performed at [facility], [address] [place], [place] [address]  Blood Culture (routine x 2)     Status: None (Preliminary result)   Collection Time: 10/02/22  5:00 PM   Specimen: BLOOD  Result Value Ref Range Status   Specimen Description   Final    BLOOD RIGHT ANTECUBITAL Performed at [facility], [address] [place], [place] [address]    Special Requests   Final    BOTTLES DRAWN AEROBIC AND ANAEROBIC Blood Culture results may not be optimal due to an excessive volume of blood received in culture bottles Performed at [facility], [address] [place], [place] [address]    Culture   Final    NO GROWTH 4 DAYS Performed at [facility], [address], [place], [place] [address]    Report Status PENDING  Incomplete  Blood Culture (routine x 2)     Status: None (Preliminary result)   Collection Time: 10/02/22  5:22 PM   Specimen: BLOOD  Result Value Ref Range Status   Specimen Description   Final    BLOOD BLOOD RIGHT HAND Performed at [facility], [address] [place], [place] [address]    Special Requests   Final    BOTTLES DRAWN AEROBIC ONLY Blood Culture adequate volume Performed at [facility], [address], [place], [place] [address]    Culture   Final    NO GROWTH 4 DAYS Performed at [facility], [address], [place], [place] [address]    Report Status PENDING  Incomplete  MRSA Next Gen by PCR, Nasal     Status: None   Collection Time: 10/02/22 11:05 PM   Specimen: Nasal Mucosa; Nasal Swab  Result Value Ref Range Status   MRSA by PCR Next Gen NOT DETECTED NOT DETECTED Final    Comment: (NOTE) The GeneXpert MRSA Assay (FDA approved for NASAL specimens only), is one component of a comprehensive MRSA colonization surveillance program. It is not intended to diagnose MRSA infection  nor to guide or monitor treatment for MRSA infections. Test performance is not FDA approved in patients less than 70 years old. Performed at [facility], [address], [place], [place] [address]     Radiology Studies: CT CORONARY MORPH W/CTA COR W/SCORE W/CA W/CM &/OR WO/CM  Addendum Date: 10/05/2022   ADDENDUM REPORT: 10/05/2022 16:35 EXAM: OVER-READ INTERPRETATION  CT CHEST The following report is an over-read performed by radiologist Dr. Narda Rutherford of [facility] on 10/05/2022. This over-read does not include interpretation of cardiac or coronary anatomy or pathology. The coronary CTA interpretation by the cardiologist is attached. COMPARISON:  Radiograph 10/02/2022. FINDINGS: Vascular: No aortic atherosclerosis. Mild aortic root dilatation of 4.1 cm at the sinuses of Valsalva. Normal caliber ascending aorta. Mediastinum/nodes: No adenopathy or mass. Unremarkable esophagus. Lungs: Mild right lower lobe atelectasis. No pulmonary nodule. No pleural fluid. The included airways are patent. Upper abdomen: No acute or unexpected findings. Musculoskeletal: Scoliosis with spinal fusion hardware. IMPRESSION: 1. Mild aortic root dilatation of 4.1 cm. 2. Mild right lower lobe atelectasis. Electronically Signed   By: Narda Rutherford M.D.   On: 10/05/2022 16:35   Result Date: 10/05/2022 CLINICAL DATA:  32 year old with cardiomyopathy, EF 35% EXAM: Cardiac/Coronary  CTA TECHNIQUE: The patient was scanned  on a [employer]. FINDINGS: A 120 kV prospective scan was triggered in the descending thoracic aorta at 111 HU's. Axial non-contrast 3 mm slices were carried out through the heart. The data set was analyzed on a dedicated work station and scored using the Agatson method. Gantry rotation speed was 250 msecs and collimation was .6 mm. 0.8 mg of sl NTG was given. The 3D data set was reconstructed in 5% intervals of the 67-82 % of the R-R cycle. Diastolic phases were  analyzed on a dedicated work station using MPR, MIP and VRT modes. The patient received 80 cc of contrast. Image quality: Fair. Aorta: 41 mm Sinus of Valsalva mildly dilated aortic root. Normal ascending aorta. No calcifications. No dissection. Tortuous thoracic aorta. Aortic Valve:  Trileaflet.  No calcifications. Coronary Arteries:  Normal coronary origin.  Right dominance. RCA is a small non dominant artery. There is no plaque. Left main is a large artery that gives rise to LAD and LCX arteries. No stenosis. LAD is a large vessel that has no plaque. D1-small caliber (<39mm), no plaque. LCX is a dominant artery.  There is no plaque. OM1- Large caliber, no stenosis OM2- moderate caliber, no stenosis. PDA - there is misalignment artifact. Other findings: Normal pulmonary vein drainage into the left atrium. Normal left atrial appendage without a thrombus. Normal size of the pulmonary artery. Please see radiology report for non cardiac findings. IMPRESSION: 1. Coronary calcium score of 0. This was 0 percentile for age and sex matched control. 2. Total plaque volume (TPV) 0 mm3 which is <1% percentile for age-and sex matched controls (calcified plaque 0 mm3; non-calcified plaque 0 mm3). 3. Normal coronary origin with LEFT dominance. 4. No evidence of CAD.  Non ischemic cardiomyopathy. 5. Mildly dilated aortic root. CAD-RADS 0. No evidence of CAD (0%). Consider non-atherosclerotic causes of chest pain. Electronically Signed: By: Donato Schultz M.D. On: 10/05/2022 16:03      Jessen Siegman T. Laelle Bridgett [facility]  If 7PM-7AM, please contact night-coverage [email or web address] 10/06/2022, 1:34 PM

## 2022-10-06 NOTE — Progress Notes (Signed)
Rounding Note    Patient Name: Samuel Becker Date of Encounter: 10/06/2022  [facility] Cardiologist: New to Dr Mayford Knife   Subjective   Feels good today and anxious to go home.  He denies any shortness of breath or chest pain but his O2 sats dropped early this morning down to 78% and 2 L nasal cannula applied with O2 sats 100%. Inpatient Medications    Scheduled Meds:  Chlorhexidine Gluconate Cloth  6 each Topical Daily   cholecalciferol  400 Units Oral Daily   docusate sodium  100 mg Oral BID   feeding supplement  237 mL Oral BID BM   furosemide  40 mg Oral Daily   ipratropium-albuterol  3 mL Nebulization BID   loratadine  10 mg Oral Daily   multivitamin with minerals  1 tablet Oral Daily   oxybutynin  15 mg Oral QHS   pantoprazole  40 mg Oral BID   sodium chloride flush  3 mL Intravenous Q12H   Continuous Infusions:  sodium chloride Stopped (10/03/22 1125)   PRN Meds: sodium chloride, acetaminophen **OR** acetaminophen, guaiFENesin, hydrALAZINE, ipratropium-albuterol, loperamide, metoprolol tartrate, nitroGLYCERIN, ondansetron **OR** ondansetron (ZOFRAN) IV, mouth rinse, pneumococcal 20-valent conjugate vaccine, senna-docusate, sodium chloride flush, traZODone   Vital Signs    Vitals:   10/05/22 2007 10/05/22 2013 10/06/22 0458 10/06/22 0756  BP: 110/77  112/77   Pulse: 80  79   Resp: 17  18   Temp: 98.3 F (36.8 C)  98.2 F (36.8 C)   TempSrc:   Oral   SpO2: 99% 97% 100% 99%  Weight:   51.2 kg   Height:        Intake/Output Summary (Last 24 hours) at 10/06/2022 1022 Last data filed at 10/06/2022 0920 Gross per 24 hour  Intake 916 ml  Output 2126 ml  Net -1210 ml       10/06/2022    4:58 AM 10/05/2022    5:00 AM 10/04/2022    6:00 AM  Last 3 Weights  Weight (lbs) 112 lb 14 oz 119 lb 14.9 oz 101 lb 13.6 oz  Weight (kg) 51.2 kg 54.4 kg 46.2 kg      Telemetry    Normal sinus rhythm- Personally Reviewed  ECG    N/A today - Personally  Reviewed  Physical Exam   GEN: Well nourished, well developed in no acute distress HEENT: Normal NECK: No JVD; No carotid bruits LYMPHATICS: No lymphadenopathy CARDIAC:RRR, no murmurs, rubs, gallops RESPIRATORY:  Clear to auscultation without rales, wheezing or rhonchi  ABDOMEN: Soft, non-tender, non-distended MUSCULOSKELETAL:  No edema SKIN: Warm and dry NEUROLOGIC:  Alert and oriented x 3 PSYCHIATRIC:  Normal affect  Labs    High Sensitivity Troponin:   Recent Labs  Lab 10/02/22 1848 10/02/22 2015 10/02/22 2305 10/03/22 0311 10/03/22 0702  TROPONINIHS 115* 143* 124* 86* 65*      Chemistry Recent Labs  Lab 10/02/22 1632 10/03/22 0311 10/04/22 0258 10/05/22 0545 10/06/22 0511  NA 137 137 138 138 136  K 4.3 3.7 4.0 3.0* 4.4  CL 100 98 91* 88* 91*  CO2 30 31 35* 38* 34*  GLUCOSE 134* 102* 118* 95 94  BUN 21* 20  CREATININE 1.04 0.81 0.70 0.88 0.79  CALCIUM 8.4* 8.2* 9.1 9.1 9.3  MG 2.2 2.5* 2.3 2.1 2.6*  PROT 6.7 6.1*  --   --   --   ALBUMIN 3.8 3.4*  --   --   --  AST 17 15  --   --   --   ALT 18 16  --   --   --   ALKPHOS 46 40  --   --   --   BILITOT 0.3 0.6  --   --   --   GFRNONAA >60 >60 >60 >60 >60  ANIONGAP 7 8 12 12 11      Lipids No results for input(s): "CHOL", "TRIG", "HDL", "LABVLDL", "LDLCALC", "CHOLHDL" in the last 168 hours.  Hematology Recent Labs  Lab 10/04/22 0258 10/05/22 0545 10/06/22 0726  WBC 11.4* 8.9 8.1  RBC 4.72 4.45 4.69  HGB 14.4 13.4 14.4  HCT 45.3 42.9 45.3  MCV 96.0 96.4 96.6  MCH 30.5 30.1 30.7  MCHC 31.8 31.2 31.8  RDW 12.4 12.8 12.6  PLT 228 240 234    Thyroid  Recent Labs  Lab 10/02/22 1632  TSH 0.704     BNP Recent Labs  Lab 10/02/22 1632  BNP 335.1*     DDimer  Recent Labs  Lab 10/02/22 2015  DDIMER <0.27      Radiology    CT CORONARY MORPH W/CTA COR W/SCORE W/CA W/CM &/OR WO/CM  Addendum Date: 10/05/2022   ADDENDUM REPORT: 10/05/2022 16:35 EXAM: OVER-READ INTERPRETATION  CT  CHEST The following report is an over-read performed by radiologist Dr. Narda Rutherford of [facility] on 10/05/2022. This over-read does not include interpretation of cardiac or coronary anatomy or pathology. The coronary CTA interpretation by the cardiologist is attached. COMPARISON:  Radiograph 10/02/2022. FINDINGS: Vascular: No aortic atherosclerosis. Mild aortic root dilatation of 4.1 cm at the sinuses of Valsalva. Normal caliber ascending aorta. Mediastinum/nodes: No adenopathy or mass. Unremarkable esophagus. Lungs: Mild right lower lobe atelectasis. No pulmonary nodule. No pleural fluid. The included airways are patent. Upper abdomen: No acute or unexpected findings. Musculoskeletal: Scoliosis with spinal fusion hardware. IMPRESSION: 1. Mild aortic root dilatation of 4.1 cm. 2. Mild right lower lobe atelectasis. Electronically Signed   By: Narda Rutherford M.D.   On: 10/05/2022 16:35   Result Date: 10/05/2022 CLINICAL DATA:  32 year old with cardiomyopathy, EF 35% EXAM: Cardiac/Coronary  CTA TECHNIQUE: The patient was scanned on a [employer]. FINDINGS: A 120 kV prospective scan was triggered in the descending thoracic aorta at 111 HU's. Axial non-contrast 3 mm slices were carried out through the heart. The data set was analyzed on a dedicated work station and scored using the Agatson method. Gantry rotation speed was 250 msecs and collimation was .6 mm. 0.8 mg of sl NTG was given. The 3D data set was reconstructed in 5% intervals of the 67-82 % of the R-R cycle. Diastolic phases were analyzed on a dedicated work station using MPR, MIP and VRT modes. The patient received 80 cc of contrast. Image quality: Fair. Aorta: 41 mm Sinus of Valsalva mildly dilated aortic root. Normal ascending aorta. No calcifications. No dissection. Tortuous thoracic aorta. Aortic Valve:  Trileaflet.  No calcifications. Coronary Arteries:  Normal coronary origin.  Right dominance. RCA is a small non dominant  artery. There is no plaque. Left main is a large artery that gives rise to LAD and LCX arteries. No stenosis. LAD is a large vessel that has no plaque. D1-small caliber (<93mm), no plaque. LCX is a dominant artery.  There is no plaque. OM1- Large caliber, no stenosis OM2- moderate caliber, no stenosis. PDA - there is misalignment artifact. Other findings: Normal pulmonary vein drainage into the left atrium. Normal left  atrial appendage without a thrombus. Normal size of the pulmonary artery. Please see radiology report for non cardiac findings. IMPRESSION: 1. Coronary calcium score of 0. This was 0 percentile for age and sex matched control. 2. Total plaque volume (TPV) 0 mm3 which is <1% percentile for age-and sex matched controls (calcified plaque 0 mm3; non-calcified plaque 0 mm3). 3. Normal coronary origin with LEFT dominance. 4. No evidence of CAD.  Non ischemic cardiomyopathy. 5. Mildly dilated aortic root. CAD-RADS 0. No evidence of CAD (0%). Consider non-atherosclerotic causes of chest pain. Electronically Signed: By: Donato Schultz M.D. On: 10/05/2022 16:03    Cardiac Studies   Echo from 10/03/22:   1. Left ventricular ejection fraction, by estimation, is 35 to 40%. The  left ventricle has moderately decreased function. The left ventricle  demonstrates global hypokinesis. Left ventricular diastolic parameters are  indeterminate. Elevated left atrial  pressure.   2. Right ventricular systolic function is mildly reduced. The right  ventricular size is moderately enlarged.   3. Left atrial size was moderately dilated.   4. Right atrial size was moderately dilated.   5. The mitral valve was not well visualized. No evidence of mitral valve  regurgitation. No evidence of mitral stenosis.   6. Siever's Bicuspid Valve with mild Sinus of Valsalva Dilation and R-L  fusiona and raphe. The aortic valve is bicuspid. Aortic valve  regurgitation is trivial.   7. Aortic dilatation noted. There is mild  dilatation of the aortic root,  measuring 43 mm.   8. The inferior vena cava is normal in size with greater than 50%  respiratory variability, suggesting right atrial pressure of 3 mmHg.   Comparison(s): No prior Echocardiogram.   Patient Profile     32 y.o. male with PMH of spina bifida, paraplegia, neurogenic bladder, GERD who is currently admitted for acute hypoxic respiratory failure 2/2 pneumonia + newly discovered CHF with decompensation. Echo from 10/03/22 showed LVEF 35-40%. Cardiology is consulted on 10/03/22 for newly diagnosed low EF CHF.   Assessment & Plan    Newly diagnosed acute systolic heart failure  Dilated cardiomyopathy  - presented with acute hypoxic respiratory failure requiring BIPAP, sudden onset SOB while taking shower, increased DOE and BLE edema over 2 weeks; had recent hospitalization until 09/26/22 for sepsis 2/2 CAP  - BNP 335, POA  - CXR with asymmetric bilateral airspace infiltrate, pulmonary edema vs infection, POA  - Echo from 10/03/22 showed LVEF 35-40%, global hypokinesis, indeterminate diastolic parameters, elevated LA pressure, mild reduced RV, mod LAE and RAE, bicuspid aortic valve with mild dilatation of the sinus of Valsalva, R-L fusion and raphe, and trivial AI  - Etiology remains unclear, possible viral or stress induced CM, low suspicion for ischemic CM given lacking risk factor and young age, serum and urine protein electrophoresis pending  - Coronary CTA showed coronary calcium score of 0 and no underlying CAD. - Chest CT also showed atelectasis in the right lower lobe likely related to shallow breathing from his spina bifida and sedentary state sitting in a chair>> encouraged him to take deep breaths -He was transitioned to p.o. Lasix 40 mg daily yesterday -He put out 2.9 L yesterday and is net -7.1 L since admission -He appears euvolemic on exam today -BP stable today -Serum creatinine stable at 0.79 with potassium 4.4 -Start Entresto 24-26 mg  twice daily today -If BP tolerates Entresto then will restart metoprolol XL 25 mg daily -Will add on spironolactone at discharge if BP allows -Due  to his spina bifida we will hold off on adding SGLT2i as it may make him more prone to yeast infections  Elevated troponin  Family history of premature CAD - no historical angina symptoms  - Hs trop 115>143 >124> 86 >65 - EKG has non-specific T wave abnormalities of inferior and lateral leads since 4/3, some similar from 2022 - Echo as above -Coronary CTA with no CAD - likely demand ischemia from CHF and infection  Bicuspid aortic valve  - no significant aortic stenosis or regurgitation - need routine Echo   Acute hypoxic respiratory failure  Health care associated pneumonia  Hypokalemia   Spina bifida with paraplegia and neurogenic bladder  GERD Diarrhea  - per primary team   I have spent a total of 30 minutes with patient reviewing coronary CTS , telemetry, EKGs, labs and examining patient as well as establishing an assessment and plan that was discussed with the patient.  > 50% of time was spent in direct patient care.     For questions or updates, please contact [facility] Please consult [email or web address] for contact info under        Signed, Armanda Magic, MD  10/06/2022, 10:22 AM

## 2022-10-06 NOTE — Progress Notes (Signed)
Mobility Specialist - Progress Note   10/06/22 0846  Mobility  Activity Transferred from bed to chair  Level of Assistance Minimal assist, patient does 75% or more  Assistive Device None  Activity Response Tolerated well  Mobility Referral Yes  $Mobility charge 1 Mobility   Pt received in bed and agreed to transfer, Min A for bed mobility, and assistance with lines. Once in chair had to assist in cleaning. Transferred back to bed to ensure a clean chair. Returned to chair with all needs met.   Will return later in day to ensure he returns to bed.  Marilynne Halsted Mobility Specialist

## 2022-10-06 NOTE — Progress Notes (Signed)
Heart Failure Nurse Navigator Progress Note  PCP: Moshe Cipro, NP PCP-Cardiologist: None Admission Diagnosis: hypoxia, systolic congestive heart failure,  Admitted from: Home via EMS  Presentation:   Samuel Becker presented with respiratory distress while bathing, on EMS arrival sats were in the 30's. Placed on NRB Recently discharged from hospital 1 week ago with pneumonia. Patient wheel chair bound with history of Spinal bifida, BP 121/86, HR 124, BNP 335, IV lasix given, per father patients legs were swollen, sepsis protocol was started ,   Patient at [facility], Navigator called and spoke with patients step mother Samuel Becker. Reported that patient is usually very independent, lives in his own part of the house, is able to transfer himself from wheel chair to other chairs/ bed/ etc. He works 3 days a week with his support person at [employer] in [place]. Step mother was educated on the sign and symptoms of heart failure, daily weights, stated that would be difficult with his wheel chair bound status, spoke about when to call his doctor or go to the ED. Diet/ fluid restrictions, reported patient does consume a lot of foods with salt including, hamburgers, hotdogs and lunch meats such as bologna, and soda. Education on taking all his medications as prescribed and attending all medical appointments. Step mother verbalized her understanding. Navigator communicated with the RN for patient via secure chat to give the patient and family a Living Better with Heart Failure Book, for additional information. Step mother verbalized her understanding of education. A HF TOC appointment was scheduled for 10/27/2022 @ 10 am. ( Spoke and clarified patients history with Tonye Becket, NP )    ECHO/ LVEF: 35-40% New  Clinical Course:  Past Medical History:  Diagnosis Date   Spina bifida      Social History   Socioeconomic History   Marital status: Single    Spouse name: Not on file    Number of children: 0   Years of education: Not on file   Highest education level: Not on file  Occupational History   Occupation: Help worker at [employer]  Tobacco Use   Smoking status: Never   Smokeless tobacco: Never  Vaping Use   Vaping Use: Never used  Substance and Sexual Activity   Alcohol use: Not Currently    Comment: occ   Drug use: Never   Sexual activity: Not on file  Other Topics Concern   Not on file  Social History Narrative   Not on file   Social Determinants of Health   Financial Resource Strain: Low Risk  (10/06/2022)   Overall Financial Resource Strain (CARDIA)    Difficulty of Paying Living Expenses: Not very hard  Food Insecurity: No Food Insecurity (10/03/2022)   Hunger Vital Sign    Worried About Running Out of Food in the Last Year: Never true    Ran Out of Food in the Last Year: Never true  Transportation Needs: No Transportation Needs (10/06/2022)   PRAPARE - Administrator, Civil Service (Medical): No    Lack of Transportation (Non-Medical): No  Physical Activity: Not on file  Stress: Not on file  Social Connections: Not on file    High Risk Criteria for Readmission and/or Poor Patient Outcomes: Heart failure hospital admissions (last 6 months): 0  No Show rate: 0 Difficult social situation: No. Lives with parents Demonstrates medication adherence: Yes Primary Language: English Literacy level: Reading, writing, and comprehension  Barriers of Care:   Diet/ fluids ( salty  foods, soda)  Daily weights (paraplegic)    Considerations/Referrals:   Referral made to Heart Failure Pharmacist Stewardship: yes Referral made to Heart Failure CSW/NCM TOC: no Referral made to Heart & Vascular TOC clinic: yes, per A.C. on 10/27/2022 @ 10 am.   Items for Follow-up on DC/TOC: Diet/ fluids restrictions (salty foods, soda) Daily weights ( paraplegic) Continued Diet/ new HF education   Rhae Hammock, BSN, RN Heart Failure Social worker Chat Only

## 2022-10-06 NOTE — Progress Notes (Signed)
Tried to wean patient off 2L of oxygen. While patient was on room air, he dropped to 78%. Patient would benefit from home oxygen.

## 2022-10-07 ENCOUNTER — Inpatient Hospital Stay (HOSPITAL_COMMUNITY): Payer: Medicare Other

## 2022-10-07 DIAGNOSIS — I5023 Acute on chronic systolic (congestive) heart failure: Secondary | ICD-10-CM | POA: Diagnosis not present

## 2022-10-07 DIAGNOSIS — I420 Dilated cardiomyopathy: Secondary | ICD-10-CM | POA: Diagnosis not present

## 2022-10-07 DIAGNOSIS — I5021 Acute systolic (congestive) heart failure: Secondary | ICD-10-CM | POA: Diagnosis not present

## 2022-10-07 DIAGNOSIS — R7989 Other specified abnormal findings of blood chemistry: Secondary | ICD-10-CM | POA: Diagnosis not present

## 2022-10-07 LAB — BASIC METABOLIC PANEL
Anion gap: 10 (ref 5–15)
BUN: 17 mg/dL (ref 6–20)
CO2: 33 mmol/L — ABNORMAL HIGH (ref 22–32)
Calcium: 8.9 mg/dL (ref 8.9–10.3)
Chloride: 92 mmol/L — ABNORMAL LOW (ref 98–111)
Creatinine, Ser: 0.78 mg/dL (ref 0.61–1.24)
GFR, Estimated: >60 mL/min (ref >60)
Glucose, Bld: 88 mg/dL (ref 70–99)
Potassium: 4 mmol/L (ref 3.5–5.1)
Sodium: 135 mmol/L (ref 135–145)

## 2022-10-07 LAB — CBC
HCT: 43.6 % (ref 39.0–52.0)
Hemoglobin: 13.8 g/dL (ref 13.0–17.0)
MCH: 30.7 pg (ref 26.0–34.0)
MCHC: 31.7 g/dL (ref 30.0–36.0)
MCV: 96.9 fL (ref 80.0–100.0)
Platelets: 208 10*3/uL (ref 150–400)
RBC: 4.5 MIL/uL (ref 4.22–5.81)
RDW: 12.3 % (ref 11.5–15.5)
WBC: 8.1 10*3/uL (ref 4.0–10.5)
nRBC: 0 % (ref 0.0–0.2)

## 2022-10-07 LAB — MAGNESIUM: Magnesium: 2.5 mg/dL — ABNORMAL HIGH (ref 1.7–2.4)

## 2022-10-07 LAB — BRAIN NATRIURETIC PEPTIDE: B Natriuretic Peptide: 12.4 pg/mL (ref 0.0–100.0)

## 2022-10-07 LAB — CULTURE, BLOOD (ROUTINE X 2)

## 2022-10-07 MED ORDER — FUROSEMIDE 40 MG PO TABS: 40.0000 mg | ORAL_TABLET | Freq: Every day | ORAL | 1 refills | Status: DC

## 2022-10-07 MED ORDER — LOPERAMIDE HCL 2 MG PO CAPS: 2.0000 mg | ORAL_CAPSULE | Freq: Four times a day (QID) | ORAL | 0 refills | Status: DC | PRN

## 2022-10-07 MED ORDER — GUAIFENESIN 100 MG/5ML PO LIQD: 5.0000 mL | ORAL | 0 refills | Status: DC | PRN

## 2022-10-07 NOTE — Discharge Summary (Signed)
Physician Discharge Summary  Samuel Becker WUJ:[id number] DOB: 1990-08-10 DOA: 10/02/2022  PCP: Moshe Cipro, NP  Admit date: 10/02/2022 Discharge date: 10/07/2022  Admitted From: Home Disposition:  Home  Discharge Condition:Stable CODE STATUS:FULL Diet recommendation: Heart Healthy  Brief/Interim Summary: 32 year old with history of spina bifida, paraplegia, neurogenic bladder, GERD and recent hospitalization from 4/3-4/9 for hypoxic respiratory failure and sepsis due to RLL CAP for which he was treated with antibiotics return to the hospital with SOB and admitted for hypoxic hypercarbic respiratory failure in the setting of acute systolic CHF versus community-acquired pneumonia.  Initially started on IV Lasix, antibiotics and steroids.  TTE with LVEF of 35%.  Cardiology/pulmonology consulted.  CT coronary without CAD or significant calcification but concerning for RLL atelectasis.  Initially on IV Lasix now changed to oral Lasix.  He qualified for home oxygen for 2 L of oxygen.  Medically stable for discharge home today.  He will follow-up with advanced heart failure team as an outpatient.  Pulmonology was will also following him as an outpatient  Following problems were addressed during the hospitalization:  Acute respiratory failure with hypoxia/hypercapnia: VBG 7 point 3/65/35/32 suggesting chronic respiratory acidosis.  Felt to be due to CHF.  Pneumonia ruled out.  Currently requiring 2 L to maintain saturation.  Qualified for home oxygen for 2 L of oxygen.  Part of the respiratory insufficiency is also from his body habitus/possible restrictive lung disease/neuromuscular weakness    Acute HFrEF/dilated CM: New diagnosis.  TTE with LVEF of 35 to 40%, GH, indeterminate DD, moderate LAE, RAE and bicuspid AV.  BNP 335.  Coronary CTA with calcium score of 0 and no underlying CAD. -Cardiology on board-transitioned to p.o. Lasix -cardiology started Entresto,but now stopped.  -He will  follow-up with advanced heart failure team as an outpatient   Community-acquired pneumonia: Ruled out.  Recently hospitalized and treated for this.   Spina bifida Paraplegia with neurogenic bladder -Does self cath at home.  Put Foley here to monitor urine output.  Foley removed on discharge   GERD -Continue PPI   Chronic constipation: Developed  diarrhea likely from antibiotics.Diarrhea improved   Hypokalemia -Supplemented and corrected   Discharge Diagnoses:  Principal Problem:   Acute exacerbation of CHF (congestive heart failure) Active Problems:   Acute hypoxemic respiratory failure   CAP (community acquired pneumonia)   Elevated troponin   Spina bifida   Paraplegia   Pressure injury of skin   Dilated cardiomyopathy   Acute systolic CHF (congestive heart failure)   Bicuspid aortic valve   Dilated aortic root   Hypokalemia    Discharge Instructions  Discharge Instructions     (HEART FAILURE PATIENTS) Call MD:  Anytime you have any of the following symptoms: 1) 3 pound weight gain in 24 hours or 5 pounds in 1 week 2) shortness of breath, with or without a dry hacking cough 3) swelling in the hands, feet or stomach 4) if you have to sleep on extra pillows at night in order to breathe.   Complete by: As directed    Call MD for:  difficulty breathing, headache or visual disturbances   Complete by: As directed    Call MD for:  extreme fatigue   Complete by: As directed    Call MD for:  persistant dizziness or light-headedness   Complete by: As directed    Diet - low sodium heart healthy   Complete by: As directed    Discharge instructions   Complete by: As directed  It has been a pleasure taking care of you!  You were hospitalized due to acute heart failure for which you have been started on medications.  Your symptoms improved.  Please review your new medication list and the directions on your medications before you take them.  Follow-up with your primary care  doctor in 1 to 2 weeks or sooner if needed.  Follow-up with cardiology and pulmonology per their recommendation.  You have an an appointment with cardiology on 10/20/22.Name and number of the provider has been attached   Discharge wound care:   Complete by: As directed    Pressure Injury 10/03/22 Vertebral column Lower;Mid Stage 2 -  Partial thickness loss of dermis presenting as a shallow open injury with a red, pink wound bed without slough. Pressure injury, advanced stage of healing  Recommendation: Daily foam dressing and monitoring   Increase activity slowly   Complete by: As directed       Allergies as of 10/07/2022       Reactions   Morphine And Related Other (See Comments)   Hallucinations    Septra [sulfamethoxazole-trimethoprim] Other (See Comments)   Gi upset    Sulfamethoxazole Other (See Comments)   Gi upset    Trimethoprim Other (See Comments)   Unknown    Latex Swelling, Rash        Medication List     STOP taking these medications    ALLERGY RELIEF PO   doxycycline 100 MG tablet Commonly known as: VIBRA-TABS   ibuprofen 200 MG tablet Commonly known as: ADVIL       TAKE these medications    acetaminophen 325 MG tablet Commonly known as: TYLENOL Take 325 mg by mouth as needed for moderate pain.   albuterol (2.5 MG/3ML) 0.083% nebulizer solution Commonly known as: PROVENTIL Take 3 mLs (2.5 mg total) by nebulization every 6 (six) hours as needed for wheezing or shortness of breath.   cetirizine 10 MG tablet Commonly known as: ZYRTEC Take 10 mg by mouth daily.   furosemide 40 MG tablet Commonly known as: LASIX Take 1 tablet (40 mg total) by mouth daily.   guaiFENesin 100 MG/5ML liquid Commonly known as: ROBITUSSIN Take 5 mLs by mouth every 4 (four) hours as needed for cough or to loosen phlegm.   loperamide 2 MG capsule Commonly known as: IMODIUM Take 1 capsule (2 mg total) by mouth 4 (four) times daily as needed for diarrhea or loose  stools.   multivitamin with minerals tablet Take 1 tablet by mouth daily.   oxybutynin 15 MG 24 hr tablet Commonly known as: DITROPAN XL Take 15 mg by mouth at bedtime.   pantoprazole 40 MG tablet Commonly known as: PROTONIX Take 1 tablet (40 mg total) by mouth 2 (two) times daily.   VITAMIN D PO Take 1 capsule by mouth daily.               Discharge Care Instructions  (From admission, onward)           Start     Ordered   10/07/22 0000  Discharge wound care:       Comments: Pressure Injury 10/03/22 Vertebral column Lower;Mid Stage 2 -  Partial thickness loss of dermis presenting as a shallow open injury with a red, pink wound bed without slough. Pressure injury, advanced stage of healing  Recommendation: Daily foam dressing and monitoring   10/07/22 1601            Follow-up Information  Moshe Cipro, NP. Schedule an appointment as soon as possible for a visit in 1 week(s).   Specialty: Family Medicine Contact information: [address] [place] [address] [phone]         [facility]. Go in 19 day(s).   Specialty: Cardiology Why: Hospital follow up 10/27/2022 @ 10 am PLEASE bring a current medication list to appointment FREE valet parking, Entrance C, off [employer] information: [address] [id number] mc [place] [address] [phone]        Tereso Newcomer T, PA-C Follow up on 10/20/2022.   Specialties: Cardiology, Physician Assistant Why: Cardiology Hospital Follow-up on 10/20/2022 at 8:25 AM. Contact information: [address] [place] [address] [phone]                Allergies  Allergen Reactions   Morphine And Related Other (See Comments)    Hallucinations    Septra [Sulfamethoxazole-Trimethoprim] Other (See Comments)    Gi upset    Sulfamethoxazole Other (See Comments)    Gi upset    Trimethoprim  Other (See Comments)    Unknown    Latex Swelling and Rash    Consultations: Cardiology   Procedures/Studies: DG Chest 2 View  Result Date: 10/07/2022 CLINICAL DATA:  Congestive heart failure.  Respiratory distress. EXAM: CHEST - 2 VIEW COMPARISON:  10/02/2022 FINDINGS: The heart is mildly enlarged but appears stable. Persistent diffuse interstitial airspace process possibly edema or bronchitis. No infiltrates or effusions. IMPRESSION: Persistent diffuse interstitial airspace process possibly edema or bronchitis. Electronically Signed   By: Rudie Meyer M.D.   On: 10/07/2022 11:17   CT CORONARY MORPH W/CTA COR W/SCORE W/CA W/CM &/OR WO/CM  Addendum Date: 10/05/2022   ADDENDUM REPORT: 10/05/2022 16:35 EXAM: OVER-READ INTERPRETATION  CT CHEST The following report is an over-read performed by radiologist Dr. Narda Rutherford of [facility] on 10/05/2022. This over-read does not include interpretation of cardiac or coronary anatomy or pathology. The coronary CTA interpretation by the cardiologist is attached. COMPARISON:  Radiograph 10/02/2022. FINDINGS: Vascular: No aortic atherosclerosis. Mild aortic root dilatation of 4.1 cm at the sinuses of Valsalva. Normal caliber ascending aorta. Mediastinum/nodes: No adenopathy or mass. Unremarkable esophagus. Lungs: Mild right lower lobe atelectasis. No pulmonary nodule. No pleural fluid. The included airways are patent. Upper abdomen: No acute or unexpected findings. Musculoskeletal: Scoliosis with spinal fusion hardware. IMPRESSION: 1. Mild aortic root dilatation of 4.1 cm. 2. Mild right lower lobe atelectasis. Electronically Signed   By: Narda Rutherford M.D.   On: 10/05/2022 16:35   Result Date: 10/05/2022 CLINICAL DATA:  32 year old with cardiomyopathy, EF 35% EXAM: Cardiac/Coronary  CTA TECHNIQUE: The patient was scanned on a [employer]. FINDINGS: A 120 kV prospective scan was triggered in the descending thoracic aorta at 111  HU's. Axial non-contrast 3 mm slices were carried out through the heart. The data set was analyzed on a dedicated work station and scored using the Agatson method. Gantry rotation speed was 250 msecs and collimation was .6 mm. 0.8 mg of sl NTG was given. The 3D data set was reconstructed in 5% intervals of the 67-82 % of the R-R cycle. Diastolic phases were analyzed on a dedicated work station using MPR, MIP and VRT modes. The patient received 80 cc of contrast. Image quality: Fair. Aorta: 41 mm Sinus of Valsalva mildly dilated aortic root. Normal ascending aorta. No calcifications. No dissection. Tortuous thoracic aorta. Aortic  Valve:  Trileaflet.  No calcifications. Coronary Arteries:  Normal coronary origin.  Right dominance. RCA is a small non dominant artery. There is no plaque. Left main is a large artery that gives rise to LAD and LCX arteries. No stenosis. LAD is a large vessel that has no plaque. D1-small caliber (<68mm), no plaque. LCX is a dominant artery.  There is no plaque. OM1- Large caliber, no stenosis OM2- moderate caliber, no stenosis. PDA - there is misalignment artifact. Other findings: Normal pulmonary vein drainage into the left atrium. Normal left atrial appendage without a thrombus. Normal size of the pulmonary artery. Please see radiology report for non cardiac findings. IMPRESSION: 1. Coronary calcium score of 0. This was 0 percentile for age and sex matched control. 2. Total plaque volume (TPV) 0 mm3 which is <1% percentile for age-and sex matched controls (calcified plaque 0 mm3; non-calcified plaque 0 mm3). 3. Normal coronary origin with LEFT dominance. 4. No evidence of CAD.  Non ischemic cardiomyopathy. 5. Mildly dilated aortic root. CAD-RADS 0. No evidence of CAD (0%). Consider non-atherosclerotic causes of chest pain. Electronically Signed: By: Donato Schultz M.D. On: 10/05/2022 16:03   ECHOCARDIOGRAM COMPLETE  Result Date: 10/03/2022    ECHOCARDIOGRAM REPORT   Patient Name:    Samuel Becker Date of Exam: 10/03/2022 Medical Rec #:  [id number]            Height:       64.0 in Accession #:    [id number]           Weight:       110.4 lb Date of Birth:  01-08-1991             BSA:          1.520 m Patient Age:    31 years             BP:           112/87 mmHg Patient Gender: M                    HR:           99 bpm. Exam Location:  Inpatient Procedure: 2D Echo, Cardiac Doppler and Color Doppler Indications:    elevated troponin  History:        Patient has no prior history of Echocardiogram examinations.  Sonographer:    Mike Gip Referring Phys: [id number] ANASTASSIA DOUTOVA IMPRESSIONS  1. Left ventricular ejection fraction, by estimation, is 35 to 40%. The left ventricle has moderately decreased function. The left ventricle demonstrates global hypokinesis. Left ventricular diastolic parameters are indeterminate. Elevated left atrial pressure.  2. Right ventricular systolic function is mildly reduced. The right ventricular size is moderately enlarged.  3. Left atrial size was moderately dilated.  4. Right atrial size was moderately dilated.  5. The mitral valve was not well visualized. No evidence of mitral valve regurgitation. No evidence of mitral stenosis.  6. Siever's Bicuspid Valve with mild Sinus of Valsalva Dilation and R-L fusiona and raphe. The aortic valve is bicuspid. Aortic valve regurgitation is trivial.  7. Aortic dilatation noted. There is mild dilatation of the aortic root, measuring 43 mm.  8. The inferior vena cava is normal in size with greater than 50% respiratory variability, suggesting right atrial pressure of 3 mmHg. Comparison(s): No prior Echocardiogram. FINDINGS  Left Ventricle: Left ventricular ejection fraction, by estimation, is 35 to 40%. The left ventricle has moderately decreased function. The left ventricle demonstrates  global hypokinesis. The left ventricular internal cavity size was normal in size. There is no left ventricular hypertrophy. Left  ventricular diastolic parameters are indeterminate. Elevated left atrial pressure. Right Ventricle: The right ventricular size is moderately enlarged. Right vetricular wall thickness was not well visualized. Right ventricular systolic function is mildly reduced. Left Atrium: Left atrial size was moderately dilated. Right Atrium: Right atrial size was moderately dilated. Pericardium: There is no evidence of pericardial effusion. Mitral Valve: The mitral valve was not well visualized. No evidence of mitral valve regurgitation. No evidence of mitral valve stenosis. Tricuspid Valve: The tricuspid valve is grossly normal. Tricuspid valve regurgitation is not demonstrated. No evidence of tricuspid stenosis. Aortic Valve: Siever's Bicuspid Valve with mild Sinus of Valsalva Dilation and R-L fusiona and raphe. The aortic valve is bicuspid. Aortic valve regurgitation is trivial. Pulmonic Valve: The pulmonic valve was normal in structure. Pulmonic valve regurgitation is not visualized. Aorta: Aortic dilatation noted. There is mild dilatation of the aortic root, measuring 43 mm. Pulmonary Artery: The pulmonary artery is of normal size. Venous: The inferior vena cava is normal in size with greater than 50% respiratory variability, suggesting right atrial pressure of 3 mmHg. IAS/Shunts: No atrial level shunt detected by color flow Doppler.  LEFT VENTRICLE PLAX 2D LVIDd:         3.40 cm     Diastology LVIDs:         3.00 cm     LV e' medial:    6.31 cm/s LV PW:         0.80 cm     LV E/e' medial:  9.6 LV IVS:        0.80 cm     LV e' lateral:   9.36 cm/s LVOT diam:     2.30 cm     LV E/e' lateral: 6.5 LV SV:         57 LV SV Index:   37 LVOT Area:     4.15 cm  LV Volumes (MOD) LV vol d, MOD A2C: 63.6 ml LV vol d, MOD A4C: 74.5 ml LV vol s, MOD A2C: 37.3 ml LV vol s, MOD A4C: 47.0 ml LV SV MOD A2C:     26.3 ml LV SV MOD A4C:     74.5 ml LV SV MOD BP:      28.2 ml RIGHT VENTRICLE             IVC RV Basal diam:  4.40 cm     IVC  diam: 1.30 cm RV S prime:     18.30 cm/s TAPSE (M-mode): 1.8 cm LEFT ATRIUM             Index        RIGHT ATRIUM           Index LA diam:        2.20 cm 1.45 cm/m   RA Area:     20.60 cm LA Vol (A2C):   32.9 ml 21.64 ml/m  RA Volume:   62.90 ml  41.38 ml/m LA Vol (A4C):   14.1 ml 9.28 ml/m LA Biplane Vol: 21.5 ml 14.14 ml/m  AORTIC VALVE LVOT Vmax:   71.10 cm/s LVOT Vmean:  51.400 cm/s LVOT VTI:    0.137 m  AORTA Ao Root diam: 4.30 cm Ao Asc diam:  3.40 cm MITRAL VALVE MV Area (PHT): 3.81 cm    SHUNTS MV Decel Time: 199 msec    Systemic VTI:  0.14 m MV  E velocity: 60.40 cm/s  Systemic Diam: 2.30 cm MV A velocity: 48.80 cm/s MV E/A ratio:  1.24 Riley Lam MD Electronically signed by Riley Lam MD Signature Date/Time: 10/03/2022/11:09:25 AM    Final    DG Chest Port 1 View  Result Date: 10/02/2022 CLINICAL DATA:  Sepsis EXAM: PORTABLE CHEST 1 VIEW COMPARISON:  09/25/2022 FINDINGS: Lung volumes are small. There is developing asymmetric bilateral airspace infiltrate, diffusely throughout the right lung and noted within the left lung base, asymmetric pulmonary edema versus infection. Small right pleural effusion suspected, new since prior exam. No pneumothorax. Cardiac size is stable and within normal limits given poor pulmonary insufflation. Moderate thoracolumbar levoscoliosis again noted with thoracolumbar fusion instrumentation again noted. No acute bone abnormality. Ventriculoperitoneal shunt catheter tubing overlies the right hemithorax. IMPRESSION: 1. Developing asymmetric bilateral airspace infiltrate, asymmetric pulmonary edema versus infection. 2. New small right pleural effusion. 3. Pulmonary hypoinflation. Electronically Signed   By: Helyn Numbers M.D.   On: 10/02/2022 16:59   DG CHEST PORT 1 VIEW  Result Date: 09/25/2022 CLINICAL DATA:  Shortness of breath, hypoxia EXAM: PORTABLE CHEST 1 VIEW COMPARISON:  Previous studies including the examination of 09/24/2022 FINDINGS:  There is poor inspiration. Transverse diameter of heart is increased. There is interval decrease in pulmonary vascular congestion. No new focal infiltrates are seen. There is a ventriculoperitoneal shunt on the right side. There is surgical fusion in thoracic and lumbar spine. Rotoscoliosis is seen in lower thoracic spine with convexity to the left. IMPRESSION: There is interval decrease in pulmonary vascular congestion and pulmonary edema. There are no new focal pulmonary infiltrates. Electronically Signed   By: Ernie Avena M.D.   On: 09/25/2022 11:13   DG CHEST PORT 1 VIEW  Result Date: 09/24/2022 CLINICAL DATA:  32 year old male with history of pneumonia. EXAM: PORTABLE CHEST - 1 VIEW COMPARISON:  09/20/2022 FINDINGS: Interval increased obscuration of the cardiomediastinal silhouette. Low lung volumes. Diffuse hazy pulmonary opacities. No definite evidence of significant pleural effusion or pneumothorax. 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 Similar appearance of thoracolumbar fixation rods with severe thoracolumbar scoliosis and ankylosis. No acute osseous abnormality. IMPRESSION: Low lung volumes with worsening diffuse hazy pulmonary opacities, which could represent pulmonary edema or multifocal pneumonia. Electronically Signed   By: Marliss Coots M.D.   On: 09/24/2022 08:15   DG Chest Portable 1 View  Result Date: 09/20/2022 CLINICAL DATA:  Shortness of breath EXAM: PORTABLE CHEST 1 VIEW COMPARISON:  Chest radiograph dated December 13, 2021. FINDINGS: The heart is normal in size. Severe levoscoliosis with Harrington rods, unchanged. Right basilar opacity suggesting atelectasis or infiltrate. 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 IMPRESSION: Right basilar opacity suggesting atelectasis or infiltrate. Follow-up examination to resolution is recommended. Advanced scoliosis with thoracic spine hardware, unchanged.  Electronically Signed   By: Larose Hires D.O.   On: 09/20/2022 21:20      Subjective: Patient seen and examined at bedside today.  Hemodynamically stable for discharge.  Discharge planning discussed with family at bedside  Discharge Exam: Vitals:   10/07/22 0947 10/07/22 1256  BP:  100/71  Pulse:  94  Resp:  18  Temp:    SpO2: (!) 85% 100%   Vitals:   10/07/22 0902 10/07/22 0942 10/07/22 0947 10/07/22 1256  BP:    100/71  Pulse:    94  Resp:    18  Temp:      TempSrc:  SpO2: 90% (!) 88% (!) 85% 100%  Weight:      Height:        General: Pt is alert, awake, not in acute distress Cardiovascular: RRR, S1/S2 +, no rubs, no gallops Respiratory: CTA bilaterally, no wheezing, no rhonchi Abdominal: Soft, NT, ND, bowel sounds + Extremities: no edema, no cyanosis    The results of significant diagnostics from this hospitalization (including imaging, microbiology, ancillary and laboratory) are listed below for reference.     Microbiology: Recent Results (from the past 240 hour(s))  Urine Culture (for pregnant, neutropenic or urologic patients or patients with an indwelling urinary catheter)     Status: None   Collection Time: 10/02/22  4:40 PM   Specimen: Urine, Clean Catch  Result Value Ref Range Status   Specimen Description   Final    URINE, CLEAN CATCH Performed at [facility], [address] [place], [place] [address]    Special Requests   Final    NONE Performed at [facility], [address], [place], [place] [address]    Culture   Final    NO GROWTH Performed at [facility], [address], [place], [place] [address]    Report Status 10/03/2022 FINAL  Final  SARS Coronavirus 2 by RT PCR (hospital order, performed in [facility] hospital lab) *cepheid single result test*     Status: None   Collection Time: 10/02/22  4:58 PM  Result Value Ref Range Status   SARS Coronavirus 2 by RT PCR NEGATIVE NEGATIVE Final     Comment: (NOTE) SARS-CoV-2 target nucleic acids are NOT DETECTED.  The SARS-CoV-2 RNA is generally detectable in upper and lower respiratory specimens during the acute phase of infection. The lowest concentration of SARS-CoV-2 viral copies this assay can detect is 250 copies / mL. A negative result does not preclude SARS-CoV-2 infection and should not be used as the sole basis for treatment or other patient management decisions.  A negative result may occur with improper specimen collection / handling, submission of specimen other than nasopharyngeal swab, presence of viral mutation(s) within the areas targeted by this assay, and inadequate number of viral copies (<250 copies / mL). A negative result must be combined with clinical observations, patient history, and epidemiological information.  Fact Sheet for Patients:   RoadLapTop.co.za  Fact Sheet for Healthcare Providers: http://kim-miller.com/  This test is not yet approved or  cleared by the [place] FDA and has been authorized for detection and/or diagnosis of SARS-CoV-2 by FDA under an Emergency Use Authorization (EUA).  This EUA will remain in effect (meaning this test can be used) for the duration of the COVID-19 declaration under Section 564(b)(1) of the Act, 21 U.S.C. section 360bbb-3(b)(1), unless the authorization is terminated or revoked sooner.  Performed at [facility], [address] [place], [place] [address]   Respiratory (~20 pathogens) panel by PCR     Status: None   Collection Time: 10/02/22  4:58 PM   Specimen: Urine, Clean Catch; Respiratory  Result Value Ref Range Status   Adenovirus NOT DETECTED NOT DETECTED Final   Coronavirus 229E NOT DETECTED NOT DETECTED Final    Comment: (NOTE) The Coronavirus on the Respiratory Panel, DOES NOT test for the novel  Coronavirus (2019 nCoV)    Coronavirus HKU1 NOT DETECTED NOT DETECTED Final    Coronavirus NL63 NOT DETECTED NOT DETECTED Final   Coronavirus OC43 NOT DETECTED NOT DETECTED Final   Metapneumovirus  NOT DETECTED NOT DETECTED Final   Rhinovirus / Enterovirus NOT DETECTED NOT DETECTED Final   Influenza A NOT DETECTED NOT DETECTED Final   Influenza B NOT DETECTED NOT DETECTED Final   Parainfluenza Virus 1 NOT DETECTED NOT DETECTED Final   Parainfluenza Virus 2 NOT DETECTED NOT DETECTED Final   Parainfluenza Virus 3 NOT DETECTED NOT DETECTED Final   Parainfluenza Virus 4 NOT DETECTED NOT DETECTED Final   Respiratory Syncytial Virus NOT DETECTED NOT DETECTED Final   Bordetella pertussis NOT DETECTED NOT DETECTED Final   Bordetella Parapertussis NOT DETECTED NOT DETECTED Final   Chlamydophila pneumoniae NOT DETECTED NOT DETECTED Final   Mycoplasma pneumoniae NOT DETECTED NOT DETECTED Final    Comment: Performed at [facility], [address], [place], [place] [address]  Blood Culture (routine x 2)     Status: None   Collection Time: 10/02/22  5:00 PM   Specimen: BLOOD  Result Value Ref Range Status   Specimen Description   Final    BLOOD RIGHT ANTECUBITAL Performed at [facility], [address], [place], [place] [address]    Special Requests   Final    BOTTLES DRAWN AEROBIC AND ANAEROBIC Blood Culture results may not be optimal due to an excessive volume of blood received in culture bottles Performed at [facility], [address] [place], [place] [address]    Culture   Final    NO GROWTH 5 DAYS Performed at [facility], [address], [place], [place] [address]    Report Status 10/07/2022 FINAL  Final  Blood Culture (routine x 2)     Status: None   Collection Time: 10/02/22  5:22 PM   Specimen: BLOOD  Result Value Ref Range Status   Specimen Description   Final    BLOOD BLOOD RIGHT HAND Performed at [facility], [address] [place], [place] [address]    Special Requests   Final     BOTTLES DRAWN AEROBIC ONLY Blood Culture adequate volume Performed at [facility], [address], [place], [place] [address]    Culture   Final    NO GROWTH 5 DAYS Performed at [facility], [address] [place], [place] [address]    Report Status 10/07/2022 FINAL  Final  MRSA Next Gen by PCR, Nasal     Status: None   Collection Time: 10/02/22 11:05 PM   Specimen: Nasal Mucosa; Nasal Swab  Result Value Ref Range Status   MRSA by PCR Next Gen NOT DETECTED NOT DETECTED Final    Comment: (NOTE) The GeneXpert MRSA Assay (FDA approved for NASAL specimens only), is one component of a comprehensive MRSA colonization surveillance program. It is not intended to diagnose MRSA infection nor to guide or monitor treatment for MRSA infections. Test performance is not FDA approved in patients less than 2 years old. Performed at [facility], [address], [place], [place] [address]      Labs: BNP (last 3 results) Recent Labs    10/02/22 1632 10/07/22 0717  BNP 335.1* 12.4   Basic Metabolic Panel: Recent Labs  Lab 10/02/22 2015 10/03/22 0311 10/04/22 0258 10/05/22 0545 10/06/22 0511 10/07/22 0720  NA  --  137 138 138 136 135  K  --  3.7 4.0 3.0* 4.4 4.0  CL  --  98 91* 88* 91* 92*  CO2  --  31 35* 38* 34* 33*  GLUCOSE  --  102* 118* 95 94 88  BUN  --  10 18 21* 20 17  CREATININE  --  0.81 0.70 0.88 0.79 0.78  CALCIUM  --  8.2* 9.1 9.1 9.3 8.9  MG  --  2.5* 2.3 2.1 2.6* 2.5*  PHOS 4.7* 4.8*  --   --   --   --    Liver Function Tests: Recent Labs  Lab 10/02/22 1632 10/03/22 0311  AST 17 15  ALT 18 16  ALKPHOS 46 40  BILITOT 0.3 0.6  PROT 6.7 6.1*  ALBUMIN 3.8 3.4*   No results for input(s): "LIPASE", "AMYLASE" in the last 168 hours. No results for input(s): "AMMONIA" in the last 168 hours. CBC: Recent Labs  Lab 10/02/22 1632 10/03/22 0311 10/04/22 0258 10/05/22 0545 10/06/22 0726 10/07/22 0720  WBC 15.7*  13.7* 11.4* 8.9 8.1 8.1  NEUTROABS 13.5*  --   --   --   --   --   HGB 14.7 13.5 14.4 13.4 14.4 13.8  HCT 46.9 42.2 45.3 42.9 45.3 43.6  MCV 97.9 97.0 96.0 96.4 96.6 96.9  PLT 290 227 228 240 234 208   Cardiac Enzymes: No results for input(s): "CKTOTAL", "CKMB", "CKMBINDEX", "TROPONINI" in the last 168 hours. BNP: Invalid input(s): "POCBNP" CBG: Recent Labs  Lab 10/02/22 1636  GLUCAP 121*   D-Dimer No results for input(s): "DDIMER" in the last 72 hours. Hgb A1c No results for input(s): "HGBA1C" in the last 72 hours. Lipid Profile No results for input(s): "CHOL", "HDL", "LDLCALC", "TRIG", "CHOLHDL", "LDLDIRECT" in the last 72 hours. Thyroid function studies No results for input(s): "TSH", "T4TOTAL", "T3FREE", "THYROIDAB" in the last 72 hours.  Invalid input(s): "FREET3" Anemia work up No results for input(s): "VITAMINB12", "FOLATE", "FERRITIN", "TIBC", "IRON", "RETICCTPCT" in the last 72 hours. Urinalysis    Component Value Date/Time   COLORURINE YELLOW 10/02/2022 1640   APPEARANCEUR HAZY (A) 10/02/2022 1640   LABSPEC 1.028 10/02/2022 1640   PHURINE 5.0 10/02/2022 1640   GLUCOSEU NEGATIVE 10/02/2022 1640   HGBUR NEGATIVE 10/02/2022 1640   BILIRUBINUR NEGATIVE 10/02/2022 1640   KETONESUR NEGATIVE 10/02/2022 1640   PROTEINUR 100 (A) 10/02/2022 1640   UROBILINOGEN 0.2 02/14/2009 1924   NITRITE NEGATIVE 10/02/2022 1640   LEUKOCYTESUR NEGATIVE 10/02/2022 1640   Sepsis Labs Recent Labs  Lab 10/04/22 0258 10/05/22 0545 10/06/22 0726 10/07/22 0720  WBC 11.4* 8.9 8.1 8.1   Microbiology Recent Results (from the past 240 hour(s))  Urine Culture (for pregnant, neutropenic or urologic patients or patients with an indwelling urinary catheter)     Status: None   Collection Time: 10/02/22  4:40 PM   Specimen: Urine, Clean Catch  Result Value Ref Range Status   Specimen Description   Final    URINE, CLEAN CATCH Performed at [facility], [address] [place], [place] [address]    Special Requests   Final    NONE Performed at [facility], [address] [place], [place] [address]    Culture   Final    NO GROWTH Performed at [facility], [address] [place], [place] [address]    Report Status 10/03/2022 FINAL  Final  SARS Coronavirus 2 by RT PCR (hospital order, performed in [facility] hospital lab) *cepheid single result test*     Status: None   Collection Time: 10/02/22  4:58 PM  Result Value Ref Range Status   SARS Coronavirus 2 by RT PCR NEGATIVE NEGATIVE Final  Comment: (NOTE) SARS-CoV-2 target nucleic acids are NOT DETECTED.  The SARS-CoV-2 RNA is generally detectable in upper and lower respiratory specimens during the acute phase of infection. The lowest concentration of SARS-CoV-2 viral copies this assay can detect is 250 copies / mL. A negative result does not preclude SARS-CoV-2 infection and should not be used as the sole basis for treatment or other patient management decisions.  A negative result may occur with improper specimen collection / handling, submission of specimen other than nasopharyngeal swab, presence of viral mutation(s) within the areas targeted by this assay, and inadequate number of viral copies (<250 copies / mL). A negative result must be combined with clinical observations, patient history, and epidemiological information.  Fact Sheet for Patients:   RoadLapTop.co.za  Fact Sheet for Healthcare Providers: http://kim-miller.com/  This test is not yet approved or  cleared by the [place] FDA and has been authorized for detection and/or diagnosis of SARS-CoV-2 by FDA under an Emergency Use Authorization (EUA).  This EUA will remain in effect (meaning this test can be used) for the duration of the COVID-19 declaration under Section 564(b)(1) of the Act, 21 U.S.C. section 360bbb-3(b)(1), unless the authorization is  terminated or revoked sooner.  Performed at [facility], [address] [place], [place] [address]   Respiratory (~20 pathogens) panel by PCR     Status: None   Collection Time: 10/02/22  4:58 PM   Specimen: Urine, Clean Catch; Respiratory  Result Value Ref Range Status   Adenovirus NOT DETECTED NOT DETECTED Final   Coronavirus 229E NOT DETECTED NOT DETECTED Final    Comment: (NOTE) The Coronavirus on the Respiratory Panel, DOES NOT test for the novel  Coronavirus (2019 nCoV)    Coronavirus HKU1 NOT DETECTED NOT DETECTED Final   Coronavirus NL63 NOT DETECTED NOT DETECTED Final   Coronavirus OC43 NOT DETECTED NOT DETECTED Final   Metapneumovirus NOT DETECTED NOT DETECTED Final   Rhinovirus / Enterovirus NOT DETECTED NOT DETECTED Final   Influenza A NOT DETECTED NOT DETECTED Final   Influenza B NOT DETECTED NOT DETECTED Final   Parainfluenza Virus 1 NOT DETECTED NOT DETECTED Final   Parainfluenza Virus 2 NOT DETECTED NOT DETECTED Final   Parainfluenza Virus 3 NOT DETECTED NOT DETECTED Final   Parainfluenza Virus 4 NOT DETECTED NOT DETECTED Final   Respiratory Syncytial Virus NOT DETECTED NOT DETECTED Final   Bordetella pertussis NOT DETECTED NOT DETECTED Final   Bordetella Parapertussis NOT DETECTED NOT DETECTED Final   Chlamydophila pneumoniae NOT DETECTED NOT DETECTED Final   Mycoplasma pneumoniae NOT DETECTED NOT DETECTED Final    Comment: Performed at [facility], [address], [place], [place] [address]  Blood Culture (routine x 2)     Status: None   Collection Time: 10/02/22  5:00 PM   Specimen: BLOOD  Result Value Ref Range Status   Specimen Description   Final    BLOOD RIGHT ANTECUBITAL Performed at [facility], [address] [place], [place] [address]    Special Requests   Final    BOTTLES DRAWN AEROBIC AND ANAEROBIC Blood Culture results may not be optimal due to an excessive volume of blood received in culture  bottles Performed at [facility], [address], [place], [place] [address]    Culture   Final    NO GROWTH 5 DAYS Performed at [facility], [address] [place], [place] [address]    Report Status 10/07/2022  FINAL  Final  Blood Culture (routine x 2)     Status: None   Collection Time: 10/02/22  5:22 PM   Specimen: BLOOD  Result Value Ref Range Status   Specimen Description   Final    BLOOD BLOOD RIGHT HAND Performed at [facility], [address] [place], [place] [address]    Special Requests   Final    BOTTLES DRAWN AEROBIC ONLY Blood Culture adequate volume Performed at [facility], [address], [place], [place] [address]    Culture   Final    NO GROWTH 5 DAYS Performed at [facility], [address] [place], [place] [address]    Report Status 10/07/2022 FINAL  Final  MRSA Next Gen by PCR, Nasal     Status: None   Collection Time: 10/02/22 11:05 PM   Specimen: Nasal Mucosa; Nasal Swab  Result Value Ref Range Status   MRSA by PCR Next Gen NOT DETECTED NOT DETECTED Final    Comment: (NOTE) The GeneXpert MRSA Assay (FDA approved for NASAL specimens only), is one component of a comprehensive MRSA colonization surveillance program. It is not intended to diagnose MRSA infection nor to guide or monitor treatment for MRSA infections. Test performance is not FDA approved in patients less than 53 years old. Performed at [facility], [address] [place], [place] [address]     Please note: You were cared for by a hospitalist during your hospital stay. Once you are discharged, your primary care physician will handle any further medical issues. Please note that NO REFILLS for any discharge medications will be authorized once you are discharged, as it is imperative that you return to your primary care physician (or establish a relationship with a primary care physician if you do  not have one) for your post hospital discharge needs so that they can reassess your need for medications and monitor your lab values.    Time coordinating discharge: 40 minutes  SIGNED:   Burnadette Pop, MD  [facility] 10/07/2022, 4:01 PM Pager [phone]  If 7PM-7AM, please contact night-coverage [email or web address] Password [email or web address]

## 2022-10-07 NOTE — Progress Notes (Addendum)
Paged NP about b/p 89/63. Asymptomatic. Sherryll Burger was held last night due to hypotension. NP aware. Advised to take manuel pressure  92/64, fyi NP

## 2022-10-07 NOTE — Progress Notes (Signed)
Mobility Specialist - Progress Note   10/07/22 1121  Mobility  Activity Ambulated with assistance in hallway (wheel chair rolled in hallway)  Level of Assistance Standby assist, set-up cues, supervision of patient - no hands on  Assistive Device Wheelchair  Distance Ambulated (ft) 700 ft  Activity Response Tolerated well  Mobility Referral Yes  $Mobility charge 1 Mobility   Pt received in bed and agreed to wheelchair transfer to ride throughout the hall. Pt was standby, only assisting with lines and O2.   Pt scooted to personal wheelchair where he rolled himself throughout the halls.  Pt SpO2% was 98% on 2L.  Pt left in wheelchair with all needs met.  Marilynne Halsted Mobility Specialist

## 2022-10-07 NOTE — Progress Notes (Cosign Needed Addendum)
SATURATION QUALIFICATIONS: (This note is used to comply with regulatory documentation for home oxygen)  Patient Saturations on Room Air at Rest = 85%  Patient Saturations on Room Air while Ambulating = 85% non ambulatory but 80-85% when xferring  Patient Saturations on 2 Liters of oxygen while Ambulating = 99% at rest and with xfers  Please briefly explain why patient needs home oxygen: unable to maintain sats above 92% even at rest without oxygen

## 2022-10-07 NOTE — Progress Notes (Addendum)
Rounding Note    Patient Name: Samuel Becker Date of Encounter: 10/07/2022  [facility] Cardiologist: New to Dr Mayford Knife   Subjective   Attempted addition of Entresto yesterday but BP dropped to 89/63 mmHg and it was stopped.  Denies any chest pain or shortness of breath.  O2 sats this morning resting on room air was 77% and now patient is on 2 L of O2 with O2 saturations 100% when ambulating.   Scheduled Meds:  Chlorhexidine Gluconate Cloth  6 each Topical Daily   cholecalciferol  400 Units Oral Daily   docusate sodium  100 mg Oral BID   feeding supplement  237 mL Oral BID BM   furosemide  40 mg Oral Daily   ipratropium-albuterol  3 mL Nebulization BID   loratadine  10 mg Oral Daily   multivitamin with minerals  1 tablet Oral Daily   oxybutynin  15 mg Oral QHS   pantoprazole  40 mg Oral BID   sacubitril-valsartan  1 tablet Oral BID   sodium chloride flush  3 mL Intravenous Q12H   Continuous Infusions:  sodium chloride Stopped (10/03/22 1125)   PRN Meds: sodium chloride, acetaminophen **OR** acetaminophen, guaiFENesin, hydrALAZINE, ipratropium-albuterol, loperamide, metoprolol tartrate, nitroGLYCERIN, ondansetron **OR** ondansetron (ZOFRAN) IV, mouth rinse, pneumococcal 20-valent conjugate vaccine, senna-docusate, sodium chloride flush, traZODone   Vital Signs    Vitals:   10/07/22 0500 10/07/22 0541 10/07/22 0555 10/07/22 0902  BP:  (!) 89/63 92/64   Pulse:  77    Resp:  14    Temp:  (!) 97.3 F (36.3 C)    TempSrc:  Oral    SpO2:  100%  90%  Weight: 46.7 kg     Height:        Intake/Output Summary (Last 24 hours) at 10/07/2022 0918 Last data filed at 10/07/2022 0745 Gross per 24 hour  Intake 800 ml  Output 1601 ml  Net -801 ml       10/07/2022    5:00 AM 10/06/2022    4:58 AM 10/05/2022    5:00 AM  Last 3 Weights  Weight (lbs) 103 lb 112 lb 14 oz 119 lb 14.9 oz  Weight (kg) 46.72 kg 51.2 kg 54.4 kg      Telemetry    Normal sinus  rhythm- Personally Reviewed  ECG    N/A today - Personally Reviewed  Physical Exam   GEN: Well nourished, well developed in no acute distress HEENT: Normal NECK: No JVD; No carotid bruits LYMPHATICS: No lymphadenopathy CARDIAC:RRR, no murmurs, rubs, gallops RESPIRATORY:  Clear to auscultation without rales, wheezing or rhonchi  ABDOMEN: Soft, non-tender, non-distended MUSCULOSKELETAL:  No edema SKIN: Warm and dry NEUROLOGIC:  Alert and oriented x 3 PSYCHIATRIC:  Normal affect  Labs    High Sensitivity Troponin:   Recent Labs  Lab 10/02/22 1848 10/02/22 2015 10/02/22 2305 10/03/22 0311 10/03/22 0702  TROPONINIHS 115* 143* 124* 86* 65*      Chemistry Recent Labs  Lab 10/02/22 1632 10/03/22 0311 10/04/22 0258 10/05/22 0545 10/06/22 0511 10/07/22 0720  NA 137 137   < > 138 136 135  K 4.3 3.7   < > 3.0* 4.4 4.0  CL 100 98   < > 88* 91* 92*  CO2 30 31   < > 38* 34* 33*  GLUCOSE 134* 102*   < > 95 94 88  BUN 11 10   < > 21* 20 17  CREATININE 1.04 0.81   < >  0.88 0.79 0.78  CALCIUM 8.4* 8.2*   < > 9.1 9.3 8.9  MG 2.2 2.5*   < > 2.1 2.6* 2.5*  PROT 6.7 6.1*  --   --   --   --   ALBUMIN 3.8 3.4*  --   --   --   --   AST 17 15  --   --   --   --   ALT 18 16  --   --   --   --   ALKPHOS 46 40  --   --   --   --   BILITOT 0.3 0.6  --   --   --   --   GFRNONAA >60 >60   < > >60 >60 >60  ANIONGAP 7 8   < > 12 11 10    < > = values in this interval not displayed.     Lipids No results for input(s): "CHOL", "TRIG", "HDL", "LABVLDL", "LDLCALC", "CHOLHDL" in the last 168 hours.  Hematology Recent Labs  Lab 10/05/22 0545 10/06/22 0726 10/07/22 0720  WBC 8.9 8.1 8.1  RBC 4.45 4.69 4.50  HGB 13.4 14.4 13.8  HCT 42.9 45.3 43.6  MCV 96.4 96.6 96.9  MCH 30.1 30.7 30.7  MCHC 31.2 31.8 31.7  RDW 12.8 12.6 12.3  PLT 240 234 208    Thyroid  Recent Labs  Lab 10/02/22 1632  TSH 0.704     BNP Recent Labs  Lab 10/02/22 1632  BNP 335.1*     DDimer  Recent  Labs  Lab 10/02/22 2015  DDIMER <0.27      Radiology    CT CORONARY MORPH W/CTA COR W/SCORE W/CA W/CM &/OR WO/CM  Addendum Date: 10/05/2022   ADDENDUM REPORT: 10/05/2022 16:35 EXAM: OVER-READ INTERPRETATION  CT CHEST The following report is an over-read performed by radiologist Dr. Narda Rutherford of [facility] on 10/05/2022. This over-read does not include interpretation of cardiac or coronary anatomy or pathology. The coronary CTA interpretation by the cardiologist is attached. COMPARISON:  Radiograph 10/02/2022. FINDINGS: Vascular: No aortic atherosclerosis. Mild aortic root dilatation of 4.1 cm at the sinuses of Valsalva. Normal caliber ascending aorta. Mediastinum/nodes: No adenopathy or mass. Unremarkable esophagus. Lungs: Mild right lower lobe atelectasis. No pulmonary nodule. No pleural fluid. The included airways are patent. Upper abdomen: No acute or unexpected findings. Musculoskeletal: Scoliosis with spinal fusion hardware. IMPRESSION: 1. Mild aortic root dilatation of 4.1 cm. 2. Mild right lower lobe atelectasis. Electronically Signed   By: Narda Rutherford M.D.   On: 10/05/2022 16:35   Result Date: 10/05/2022 CLINICAL DATA:  32 year old with cardiomyopathy, EF 35% EXAM: Cardiac/Coronary  CTA TECHNIQUE: The patient was scanned on a [employer]. FINDINGS: A 120 kV prospective scan was triggered in the descending thoracic aorta at 111 HU's. Axial non-contrast 3 mm slices were carried out through the heart. The data set was analyzed on a dedicated work station and scored using the Agatson method. Gantry rotation speed was 250 msecs and collimation was .6 mm. 0.8 mg of sl NTG was given. The 3D data set was reconstructed in 5% intervals of the 67-82 % of the R-R cycle. Diastolic phases were analyzed on a dedicated work station using MPR, MIP and VRT modes. The patient received 80 cc of contrast. Image quality: Fair. Aorta: 41 mm Sinus of Valsalva mildly dilated aortic  root. Normal ascending aorta. No calcifications. No dissection. Tortuous thoracic aorta. Aortic Valve:  Trileaflet.  No  calcifications. Coronary Arteries:  Normal coronary origin.  Right dominance. RCA is a small non dominant artery. There is no plaque. Left main is a large artery that gives rise to LAD and LCX arteries. No stenosis. LAD is a large vessel that has no plaque. D1-small caliber (<13mm), no plaque. LCX is a dominant artery.  There is no plaque. OM1- Large caliber, no stenosis OM2- moderate caliber, no stenosis. PDA - there is misalignment artifact. Other findings: Normal pulmonary vein drainage into the left atrium. Normal left atrial appendage without a thrombus. Normal size of the pulmonary artery. Please see radiology report for non cardiac findings. IMPRESSION: 1. Coronary calcium score of 0. This was 0 percentile for age and sex matched control. 2. Total plaque volume (TPV) 0 mm3 which is <1% percentile for age-and sex matched controls (calcified plaque 0 mm3; non-calcified plaque 0 mm3). 3. Normal coronary origin with LEFT dominance. 4. No evidence of CAD.  Non ischemic cardiomyopathy. 5. Mildly dilated aortic root. CAD-RADS 0. No evidence of CAD (0%). Consider non-atherosclerotic causes of chest pain. Electronically Signed: By: Donato Schultz M.D. On: 10/05/2022 16:03    Cardiac Studies   Echo from 10/03/22:   1. Left ventricular ejection fraction, by estimation, is 35 to 40%. The  left ventricle has moderately decreased function. The left ventricle  demonstrates global hypokinesis. Left ventricular diastolic parameters are  indeterminate. Elevated left atrial  pressure.   2. Right ventricular systolic function is mildly reduced. The right  ventricular size is moderately enlarged.   3. Left atrial size was moderately dilated.   4. Right atrial size was moderately dilated.   5. The mitral valve was not well visualized. No evidence of mitral valve  regurgitation. No evidence of mitral  stenosis.   6. Siever's Bicuspid Valve with mild Sinus of Valsalva Dilation and R-L  fusiona and raphe. The aortic valve is bicuspid. Aortic valve  regurgitation is trivial.   7. Aortic dilatation noted. There is mild dilatation of the aortic root,  measuring 43 mm.   8. The inferior vena cava is normal in size with greater than 50%  respiratory variability, suggesting right atrial pressure of 3 mmHg.   Comparison(s): No prior Echocardiogram.   Patient Profile     33 y.o. male with PMH of spina bifida, paraplegia, neurogenic bladder, GERD who is currently admitted for acute hypoxic respiratory failure 2/2 pneumonia + newly discovered CHF with decompensation. Echo from 10/03/22 showed LVEF 35-40%. Cardiology is consulted on 10/03/22 for newly diagnosed low EF CHF.   Assessment & Plan    Newly diagnosed acute systolic heart failure  Dilated cardiomyopathy  - presented with acute hypoxic respiratory failure requiring BIPAP, sudden onset SOB while taking shower, increased DOE and BLE edema over 2 weeks; had recent hospitalization until 09/26/22 for sepsis 2/2 CAP  - BNP 335, POA  - CXR with asymmetric bilateral airspace infiltrate, pulmonary edema vs infection, POA  - Echo from 10/03/22 showed LVEF 35-40%, global hypokinesis, indeterminate diastolic parameters, elevated LA pressure, mild reduced RV, mod LAE and RAE, bicuspid aortic valve with mild dilatation of the sinus of Valsalva, R-L fusion and raphe, and trivial AI  - Etiology remains unclear, possible viral or stress induced CM, low suspicion for ischemic CM given lacking risk factor and young age, serum and urine protein electrophoresis pending  - Coronary CTA showed coronary calcium score of 0 and no underlying CAD. - Chest CT also showed atelectasis in the right lower lobe likely related to  shallow breathing from his spina bifida and sedentary state sitting in a chair>> encouraged him to take deep breaths -He was transitioned to p.o. Lasix  40 mg daily yesterday -He put out 1.6 L yesterday and is net -8.3 L since admission -Appears euvolemic on exam given ongoing requirements for oxygen we will check a BNP and chest x-ray although I suspect this is all hypoventilation from spina bifida >>O2 sats increase with deep breathing -I will order an incentive spirometer -Serum creatinine stable at 0.78 and potassium 4 magnesium 2.5 -BP stable today -Serum creatinine stable at 0.79 with potassium 4.4 -Started Entresto yesterday but BP dropped into the 80s systolic and this was stopped -Currently soft BP prohibits GDMT with beta-blocker or ARB/ARNI -Due to his spina bifida we will hold off on adding SGLT2i as it may make him more prone to yeast infections -Will get in with AHF outpatient in the next week or so  Elevated troponin  Family history of premature CAD - no historical angina symptoms  - Hs trop 115>143 >124> 86 >65 - EKG has non-specific T wave abnormalities of inferior and lateral leads since 4/3, some similar from 2022 - Echo as above - Coronary CTA with no CAD - likely demand ischemia from CHF and infection  Bicuspid aortic valve  - no significant aortic stenosis or regurgitation - need routine Echo   Acute hypoxic respiratory failure  Health care associated pneumonia  Hypokalemia   Spina bifida with paraplegia and neurogenic bladder  GERD Diarrhea  - per primary team     For questions or updates, please contact [facility] Please consult [email or web address] for contact info under        Signed, Armanda Magic, MD  10/07/2022, 9:18 AM

## 2022-10-07 NOTE — Plan of Care (Signed)
°  Problem: Education: °Goal: Ability to demonstrate management of disease process will improve °Outcome: Progressing °  °Problem: Education: °Goal: Ability to verbalize understanding of medication therapies will improve °Outcome: Progressing °  °Problem: Education: °Goal: Individualized Educational Video(s) °Outcome: Progressing °  °

## 2022-10-07 NOTE — Progress Notes (Signed)
   Patient Saturations on Room Air at Rest = 77%  Patient Saturations on [employer] while Ambulating = 77% - patient in non ambulatory; but desats with minimal movement and/or xferring from bed to wheelchair  Patient Saturations on 2 Liters of oxygen while Ambulating = 100%  Please briefly explain why patient needs home oxygen: pt continues to desat on RA; consistently needs reapplication of oxygen to maintain sats

## 2022-10-07 NOTE — TOC Transition Note (Signed)
Transition of Care [facility]) - CM/SW Discharge Note   Patient Details  Name: Samuel Becker MRN: [id number] Date of Birth: [birth year]  Transition of Care [facility] CM/SW Contact:  Adrian Prows, RN Phone Number: 10/07/2022, 4:21 PM   Clinical Narrative:    Orders received for home oxygen; spoke w/ pt and family in room; they do not have agency preference; contacted Jermaine at [facility]; he says agency can provide services and potable tank will be delivered to room; pt/family notified tank travel tank will be delivered to room, and agency contact info placed in follow-up provider section of d/c instructions; no TOC needs.   Final next level of care: Home/Self Care Barriers to Discharge: No Barriers Identified   Patient Goals and CMS Choice CMS Medicare.gov Compare Post Acute Care list provided to:: Patient Choice offered to / list presented to : Patient  Discharge Placement                         Discharge Plan and Services Additional resources added to the After Visit Summary for     Discharge Planning Services: CM Consult            DME Arranged: Oxygen DME Agency: [employer] Date DME Agency Contacted: 10/07/22 Time DME Agency Contacted: 1620 Representative spoke with at DME Agency: Vaughan Basta            Social Determinants of Health (SDOH) Interventions SDOH Screenings   Food Insecurity: No Food Insecurity (10/03/2022)  Housing: Low Risk  (10/06/2022)  Transportation Needs: No Transportation Needs (10/06/2022)  Utilities: Not At Risk (10/03/2022)  Alcohol Screen: Low Risk  (10/06/2022)  Financial Resource Strain: Low Risk  (10/06/2022)  Tobacco Use: Low Risk  (10/06/2022)     Readmission Risk Interventions    10/03/2022    6:02 PM 09/26/2022    9:25 AM 09/21/2022    3:22 PM  Readmission Risk Prevention Plan  Post Dischage Appt Complete Complete Complete  Medication Screening Complete Complete Complete  Transportation Screening Complete  Complete Complete

## 2022-10-13 ENCOUNTER — Telehealth (HOSPITAL_COMMUNITY): Payer: Self-pay | Admitting: Cardiology

## 2022-10-13 NOTE — Telephone Encounter (Signed)
Pts father called with concerns of BLE Weight at home today 115.5 Decrease in UOP despite lasix increase by PCP on 4/23 +SOB, pt uses home o2 (sats 95-98%)   Above reviewed with Prince Rome, NP Will need to follow up with PCP or [facility] cardiology

## 2022-10-16 ENCOUNTER — Telehealth (HOSPITAL_BASED_OUTPATIENT_CLINIC_OR_DEPARTMENT_OTHER): Payer: Self-pay | Admitting: Pulmonary Disease

## 2022-10-16 ENCOUNTER — Telehealth: Payer: Self-pay | Admitting: Cardiology

## 2022-10-16 NOTE — Telephone Encounter (Signed)
Spoke with the patient's father who states that the patient is having some swelling in his abdomen and upper legs. He states that his weight this morning was 114. It looks like the last weight in his chart was 103 on 4/20. He did see his PCP on 4/23 and they advised his to take an extra 40 mg of Lasix daily as needed. Patient's father states that he has needed to take it almost every day since then. He states that they also did lab work and BNP and potassium were both normal. They have been monitoring his input and output and states that the other day he did go a little bit over on his intake. They have been monitoring his diet and trying to avoid take out and high sodium foods. Father states that swelling is has somewhat evened out and has been stable over the past couple of days. Patient is not having any shortness of breath. Also reports that the patient self caths and previously he would cath himself about 4 times a day. He has needed to about 10 times per day now. Urine output has been good. Patient's father also has questions about whether the patient still needs to be on oxygen which I have advised to reach out to patient's PCP about. Patient is currently scheduled for an appointment with Tereso Newcomer, PA on 5/3. Advised on returning to ER if weight does not come down or if he develops symptoms. Will make Dr. Mayford Knife aware for further recommendations.

## 2022-10-16 NOTE — Telephone Encounter (Signed)
Left message for patient's father to call back.  He currently has an appointment with pulmonary on 5/17

## 2022-10-16 NOTE — Telephone Encounter (Signed)
Pt c/o swelling: STAT is pt has developed SOB within 24 hours  How much weight have you gained and in what time span? 6 pounds in about a week  If swelling, where is the swelling located? Feet, but pt's stomach is really hard  Are you currently taking a fluid pill? Yes, lasix   Are you currently SOB? No  Do you have a log of your daily weights (if so, list)? Today was 114.8 and last week the patient was 99 lbs  Have you gained 3 pounds in a day or 5 pounds in a week?Yes  Have you traveled recently? No  Patient's father is calling stating they have been monitoring the patient. Patient's father stated that the patient was given a second dosage of Lasix because of the swelling on last Monday. Patient was also given some potassium as well. Patient is also on 2L of oxygen since his hospital visit. Yesterday patient's father took him off the oxygen yesterday for about 20 mins and his oxygen level stayed at 94% but he put him back on oxygen. Patient's father would like to know if they can take him off the oxygen. Patient's father is concerned about the swelling and his stomach being really hard.

## 2022-10-19 LAB — LAB REPORT - SCANNED: EGFR: 120

## 2022-10-19 NOTE — Telephone Encounter (Signed)
Called and spoke with Arlys John, pt's father letting him know that we will need to reassess pt at upcoming OV to see if the O2 could be discontinued and he verbalized understanding. Nothing further needed.

## 2022-10-19 NOTE — Progress Notes (Signed)
Cardiology Office Note:    Date:  10/20/2022  ID:  Samuel Becker, DOB 1990-07-16, MRN [id number] PCP: Moshe Cipro, NP  [facility] Providers Cardiologist:  Armanda Magic, MD          Patient Profile:   (HFrEF) heart failure with reduced ejection fraction Non-ischemic cardiomyopathy  TTE 10/03/22: EF 35-40, global HK, mildly reduced RVSF, mod BAE, Siever's Bicuspid AV, mild SoV dilation, R-L fusion and raphe, trivial AI, Ao root 43 mm, RAP 3 CCTA 10/05/22:  mild Ao root dilations (4.1 cm); CAC score 0; no CAD; total plaque volume < 1% Intol fo Entresto due to ? BP Bicuspid Aortic Valve Mild SoV dilation Spina bifida  Neurogenic bladder       History of Present Illness:   Samuel Becker is a 32 y.o. male who returns for post hospital f/u. He was admitted with community acquired pneumonia 4/3-4/9. He was readmitted 4/15-4/20 with hypoxic hypercarbic respiratory failure 2/2 acute (HFrEF) heart failure with reduced ejection fraction. He was diuresed with IV Lasix. TTE showed EF 35-40, mild SoV dilation, bicuspid AV w trivial AI. CCTA showed no CAD. He could not tol Entresto 2/2 hypotension.  He is here today with his stepmother and father.  It took him several days to get a scale at home.  His father weighed the wheelchair and subtract that from his weight every day.  It has been fairly stable since he got his scale.  Of note, his weight is up about 10 pounds from his discharge weight from the hospital.  This may be related to a difference in scales.  He has had lower extremity edema most of his life due to being in a wheelchair.  However, his lower extremity edema has been worse recently.  His PCP increased his furosemide to 40 mg twice daily.  He has started wearing compression hose.  His edema has improved significantly over the last couple of days.  He has not had chest pain, shortness of breath, syncope.  He was placed on oxygen secondary to hypoxia.  He sees  pulmonology in a couple of weeks.  He has been measuring his oxygen at home at different times while not wearing O2 and it has been normal.  I recommend that he follow-up with pulmonology to decide when to discontinue oxygen altogether.  He can try to take breaks during the day but should continue to wear it at night.  Review of Systems  HENT:         + Oral candidiasis   see the HPI    Studies Reviewed:    EKG: NSR, HR 99, rightward axis, nonspecific ST-T wave changes, QTc 413   Risk Assessment/Calculations:             Physical Exam:   VS:  BP 112/70   Pulse 88   Ht 5\' 4"  (1.626 m)   Wt 113 lb (51.3 kg)   SpO2 99%   BMI 19.40 kg/m    Wt Readings from Last 3 Encounters:  10/20/22 113 lb (51.3 kg)  10/07/22 103 lb (46.7 kg)  09/21/22 93 lb 4.1 oz (42.3 kg)    Constitutional:      Appearance: Not in distress.     Comments: In a wheelchair  Neck:     Vascular: No JVR.  Pulmonary:     Breath sounds: Normal breath sounds. No wheezing. No rales.  Cardiovascular:     Normal rate. Regular rhythm.     Murmurs:  There is no murmur.  Edema:    Peripheral edema present.    Ankle: bilateral trace edema of the ankle.    Feet: 1+ edema of the left foot. Abdominal:     Palpations: Abdomen is soft.       ASSESSMENT AND PLAN:   HFrEF (heart failure with reduced ejection fraction) (HCC) Non-ischemic cardiomyopathy. EF 35-40. Question if cardiomyopathy due to acute illness from pneumonia and sepsis. Hopefully, he will have full recovery of LVF. His functional status difficult to evaluate due to limited mobility from spina bifida. But he does note improved breathing. He is able to transfer himself. Prior to admission, he was significantly short of breath with this. GDMT was limited in the hospital.  He has follow-up with the advanced heart failure clinic in 1 week.  Labs obtained from primary care after pt left the office today. These were reviewed - 10/19/22: Creatinine 0.8 (normal), K+  4.5 (normal) ALT 13 (normal) Hgb 13.9 (normal). Continue furosemide 40 mg twice daily, K+ 20 mEq twice daily Start metoprolol succinate 12.5 mg daily Keep follow-up with advanced heart failure clinic in 1 week Since renal function is preserved, consider low-dose ACE/ARB if blood pressure will tolerate Eventually consider spironolactone  He self caths due to neurogenic bladder.  Therefore, I do not think he is a candidate for SGLT2 inhibitor. He will need follow-up echocardiogram once GDMT fully titrated Arrange follow-up with Dr. Mayford Knife or me in 4 to 6 weeks (or further out if CHF clinic will follow him closely)  Bicuspid aortic valve He had mild sinus of Valsalva dilation and trivial AI.  He will need continued surveillance with echocardiograms every 1 to 2 years.  CAP (community acquired pneumonia) He seems to have fully recovered from this.  Continue follow-up with primary care as planned.      Dispo:  Return in about 6 weeks (around 12/01/2022) for Routine Follow Up with Dr. Mayford Knife, or Tereso Newcomer, PA-C.  Signed, Tereso Newcomer, PA-C

## 2022-10-20 ENCOUNTER — Encounter: Payer: Self-pay | Admitting: Physician Assistant

## 2022-10-20 ENCOUNTER — Ambulatory Visit: Payer: Medicare Other | Attending: Physician Assistant | Admitting: Physician Assistant

## 2022-10-20 VITALS — BP 112/70 | HR 88 | Ht 64.0 in | Wt 113.0 lb

## 2022-10-20 DIAGNOSIS — Q231 Congenital insufficiency of aortic valve: Secondary | ICD-10-CM | POA: Insufficient documentation

## 2022-10-20 DIAGNOSIS — I5020 Unspecified systolic (congestive) heart failure: Secondary | ICD-10-CM | POA: Diagnosis not present

## 2022-10-20 DIAGNOSIS — J189 Pneumonia, unspecified organism: Secondary | ICD-10-CM | POA: Insufficient documentation

## 2022-10-20 MED ORDER — METOPROLOL SUCCINATE ER 25 MG PO TB24: 12.5000 mg | ORAL_TABLET | Freq: Every evening | ORAL | 3 refills | Status: DC

## 2022-10-20 NOTE — Assessment & Plan Note (Signed)
He had mild sinus of Valsalva dilation and trivial AI.  He will need continued surveillance with echocardiograms every 1 to 2 years.

## 2022-10-20 NOTE — Assessment & Plan Note (Addendum)
Non-ischemic cardiomyopathy. EF 35-40. Question if cardiomyopathy due to acute illness from pneumonia and sepsis. Hopefully, he will have full recovery of LVF. His functional status difficult to evaluate due to limited mobility from spina bifida. But he does note improved breathing. He is able to transfer himself. Prior to admission, he was significantly short of breath with this. GDMT was limited in the hospital.  He has follow-up with the advanced heart failure clinic in 1 week.  Labs obtained from primary care after pt left the office today. These were reviewed - 10/19/22: Creatinine 0.8 (normal), K+ 4.5 (normal) ALT 13 (normal) Hgb 13.9 (normal). Continue furosemide 40 mg twice daily, K+ 20 mEq twice daily Start metoprolol succinate 12.5 mg daily Keep follow-up with advanced heart failure clinic in 1 week Since renal function is preserved, consider low-dose ACE/ARB if blood pressure will tolerate Eventually consider spironolactone  He self caths due to neurogenic bladder.  Therefore, I do not think he is a candidate for SGLT2 inhibitor. He will need follow-up echocardiogram once GDMT fully titrated Arrange follow-up with Dr. Mayford Knife or me in 4 to 6 weeks (or further out if CHF clinic will follow him closely)

## 2022-10-20 NOTE — Patient Instructions (Signed)
Medication Instructions:  1.Start metoprolol succinate (Toprol XL) 12.5 mg every evening, (this will be 1/2 of a 25 mg tablet) *If you need a refill on your cardiac medications before your next appointment, please call your pharmacy*  Lab Work: None ordered If you have labs (blood work) drawn today and your tests are completely normal, you will receive your results only by: MyChart Message (if you have MyChart) OR A paper copy in the mail If you have any lab test that is abnormal or we need to change your treatment, we will call you to review the results.   Follow-Up: At [facility], you and your health needs are our priority.  As part of our continuing mission to provide you with exceptional heart care, we have created designated Provider Care Teams.  These Care Teams include your primary Cardiologist (physician) and Advanced Practice Providers (APPs -  Physician Assistants and Nurse Practitioners) who all work together to provide you with the care you need, when you need it.  Your next appointment:   4-6 week(s)  Provider:   Dr Mayford Knife or Tereso Newcomer, PA-C

## 2022-10-20 NOTE — Assessment & Plan Note (Signed)
He seems to have fully recovered from this.  Continue follow-up with primary care as planned.

## 2022-10-24 ENCOUNTER — Encounter (HOSPITAL_COMMUNITY): Payer: Self-pay

## 2022-10-24 ENCOUNTER — Ambulatory Visit (HOSPITAL_COMMUNITY)
Admission: RE | Admit: 2022-10-24 | Discharge: 2022-10-24 | Disposition: A | Discharge status: Home / Self Care | Payer: Medicare Other | Source: Ambulatory Visit | Attending: Adult Health | Admitting: Adult Health

## 2022-10-24 VITALS — BP 114/85 | HR 98 | Wt 112.1 lb

## 2022-10-24 DIAGNOSIS — N319 Neuromuscular dysfunction of bladder, unspecified: Secondary | ICD-10-CM | POA: Diagnosis not present

## 2022-10-24 DIAGNOSIS — G8220 Paraplegia, unspecified: Secondary | ICD-10-CM | POA: Diagnosis not present

## 2022-10-24 DIAGNOSIS — Q059 Spina bifida, unspecified: Secondary | ICD-10-CM | POA: Diagnosis not present

## 2022-10-24 DIAGNOSIS — I5020 Unspecified systolic (congestive) heart failure: Secondary | ICD-10-CM | POA: Insufficient documentation

## 2022-10-24 DIAGNOSIS — Z993 Dependence on wheelchair: Secondary | ICD-10-CM | POA: Insufficient documentation

## 2022-10-24 DIAGNOSIS — K219 Gastro-esophageal reflux disease without esophagitis: Secondary | ICD-10-CM | POA: Insufficient documentation

## 2022-10-24 DIAGNOSIS — Z79899 Other long term (current) drug therapy: Secondary | ICD-10-CM | POA: Diagnosis not present

## 2022-10-24 DIAGNOSIS — Q231 Congenital insufficiency of aortic valve: Secondary | ICD-10-CM | POA: Insufficient documentation

## 2022-10-24 DIAGNOSIS — Z8249 Family history of ischemic heart disease and other diseases of the circulatory system: Secondary | ICD-10-CM | POA: Diagnosis not present

## 2022-10-24 MED ORDER — SPIRONOLACTONE 25 MG PO TABS: 12.5000 mg | ORAL_TABLET | Freq: Every day | ORAL | 3 refills | Status: DC

## 2022-10-24 MED ORDER — POTASSIUM CHLORIDE ER 20 MEQ PO TBCR: 1.0000 | EXTENDED_RELEASE_TABLET | Freq: Every day | ORAL | 3 refills | Status: DC

## 2022-10-24 MED ORDER — FUROSEMIDE 40 MG PO TABS: 40.0000 mg | ORAL_TABLET | Freq: Every day | ORAL | 4 refills | Status: DC

## 2022-10-24 NOTE — Patient Instructions (Addendum)
Medication Changes:  DECREASE LASIX TO 40 MG DAILY   DECREASE POTASSIUM TO 20 mEq  START TAKING SPIRONOLACTONE 12.5 MG (half a tablet) DAILY   Follow-Up in:   Your physician recommends that you schedule a follow-up appointment in: 2 WEEKS WITH TOC   Do the following things EVERYDAY: Weigh yourself in the morning before breakfast. Write it down and keep it in a log. Take your medicines as prescribed Eat low salt foods--Limit salt (sodium) to 2000 mg per day.  Stay as active as you can everyday Limit all fluids for the day to less than 2 liters    Need to Contact us:  If you have any questions or concerns before your next appointment please send [place] a message through [place] or call our office at [phone].    TO LEAVE A MESSAGE FOR THE NURSE SELECT OPTION 2, PLEASE LEAVE A MESSAGE INCLUDING: YOUR NAME DATE OF BIRTH CALL BACK NUMBER REASON FOR CALL**this is important as we prioritize the call backs  YOU WILL RECEIVE A CALL BACK THE SAME DAY AS LONG AS YOU CALL BEFORE 4:00 PM   At the [facility], you and your health needs are our priority. As part of our continuing mission to provide you with exceptional heart care, we have created designated Provider Care Teams. These Care Teams include your primary Cardiologist (physician) and Advanced Practice Providers (APPs- Physician Assistants and Nurse Practitioners) who all work together to provide you with the care you need, when you need it.   You may see any of the following providers on your designated Care Team at your next follow up: Dr Arvilla Meres Dr Marca Ancona Dr. Marcos Eke, NP Robbie Lis, [place] [facility] [place], [place] Brynda Peon, NP Karle Plumber, PharmD   Please be sure to bring in all your medications bottles to every appointment.    Thank you for choosing [facility]

## 2022-10-24 NOTE — Progress Notes (Signed)
HEART & VASCULAR TRANSITION OF CARE CONSULT NOTE     Referring Physician: Dr Mayford Knife  Primary Care: Moshe Cipro NP  Primary Cardiologist: Dr Mayford Knife  HPI: Referred to clinic by Dr Mayford Knife for heart failure consultation.   Mr Turner is 32 year old with a history of spina bifida, paraplegia, neurogenic bladder, GERD, and recently diagnosed acute heart failure.   Admitted 09/20/22 with CAP and acute hypoxic respiratory failure. Treated with antibiotics and discharged on 09/26/2022  Readmitted 10/02/22 with acute respiratory failure and acute HFrEF.  Echo showed EF 35-40%. Coronary CT ok. Not placed on SGLT2i due to neurogenic bladder. Diuresed with IV lasix. Transitioned to lasix 40 mg po daily. He was not placed on GDMT  due to soft BP. Discharged on oxygen as needed. Discharge date 10/07/22.   At PCP with [facility]. 10/19/22: Creatinine 0.8 (normal), K+ 4.5 (normal) ALT 13 (normal) Hgb 13.9 (normal).   Saw Tereso Newcomer PA-C and was started on Toprol XL 12.5 mg daily and lasix was increased to 40 mg bid.    He presents today with his step mother. Overall feeling much better. Wheelchair bound. He has bowel program every other day. Followed closely at [facility].  Uses I & O cath 4-6 times a day. He has no difficulty moving around in his wheelchair and able to perform ADLs. Denies SOB/PND/Orthopnea. Denies dizziness. SBP at home 110. Only using oxygen as needed. Appetite ok. No fever or chills. Weight at home 112 pounds. Taking all medications. Lives with his Dad and stepmother. He has a caregiver 5 days a week.   Cardiac Testing  Coronary CT 09/2022  1. Mild aortic root dilatation of 4.1 cm. 2. Mild right lower lobe atelectasis.    Echo 09/2022   1. Left ventricular ejection fraction, by estimation, is 35 to 40%. The  left ventricle has moderately decreased function. The left ventricle  demonstrates global hypokinesis. Left ventricular diastolic parameters are  indeterminate. Elevated left  atrial  pressure.   2. Right ventricular systolic function is mildly reduced. The right  ventricular size is moderately enlarged.   3. Left atrial size was moderately dilated.   4. Right atrial size was moderately dilated.   5. The mitral valve was not well visualized. No evidence of mitral valve  regurgitation. No evidence of mitral stenosis.   6. Siever's Bicuspid Valve with mild Sinus of Valsalva Dilation and R-L  fusiona and raphe. The aortic valve is bicuspid. Aortic valve  regurgitation is trivial.   7. Aortic dilatation noted. There is mild dilatation of the aortic root,  measuring 43 mm.  Review of Systems: [y] = yes, [ ]  = no   General: Weight gain [ ] ; Weight loss [ ] ; Anorexia [ ] ; Fatigue [ ] ; Fever [ ] ; Chills [ ] ; Weakness [ ]   Cardiac: Chest pain/pressure [ ] ; Resting SOB [ ] ; Exertional SOB [ ] ; Orthopnea [ ] ; Pedal Edema [ ] ; Palpitations [ ] ; Syncope [ ] ; Presyncope [ ] ; Paroxysmal nocturnal dyspnea[ ]   Pulmonary: Cough [ ] ; Wheezing[ ] ; Hemoptysis[ ] ; Sputum [ ] ; Snoring [ ]   GI: Vomiting[ ] ; Dysphagia[ ] ; Melena[ ] ; Hematochezia [ ] ; Heartburn[ ] ; Abdominal pain [ ] ; Constipation [Y ]; Diarrhea [Y ]; BRBPR [ ]   GU: Hematuria[ ] ; Dysuria [ ] ; Nocturia[ ]   Vascular: Pain in legs with walking [ ] ; Pain in feet with lying flat [ ] ; Non-healing sores [ ] ; Stroke [ ] ; TIA [ ] ; Slurred speech [ ] ;  Neuro: Headaches[ ] ; Vertigo[ ] ; Seizures[ ] ; Paresthesias[ ] ;Blurred vision [ ] ; Diplopia [ ] ; Vision changes [ ]   Ortho/Skin: Arthritis [ ] ; Joint pain [Y ]; Muscle pain [ ] ; Joint swelling [ ] ; Back Pain [Y ]; Rash [ ]   Psych: Depression[ ] ; Anxiety[ ]   Heme: Bleeding problems [ ] ; Clotting disorders [ ] ; Anemia [ ]   Endocrine: Diabetes [ ] ; Thyroid dysfunction[ ]    Past Medical History:  Diagnosis Date   Spina bifida (HCC)     Current Outpatient Medications  Medication Sig Dispense Refill   acetaminophen (TYLENOL) 325 MG tablet Take 325 mg by mouth as needed for moderate  pain.     albuterol (PROVENTIL) (2.5 MG/3ML) 0.083% nebulizer solution Take 3 mLs (2.5 mg total) by nebulization every 6 (six) hours as needed for wheezing or shortness of breath. 75 mL 1   cetirizine (ZYRTEC) 10 MG tablet Take 10 mg by mouth daily.     furosemide (LASIX) 40 MG tablet Take 40 mg by mouth 2 (two) times daily.     guaiFENesin (ROBITUSSIN) 100 MG/5ML liquid Take 5 mLs by mouth every 4 (four) hours as needed for cough or to loosen phlegm. 120 mL 0   loperamide (IMODIUM) 2 MG capsule Take 1 capsule (2 mg total) by mouth 4 (four) times daily as needed for diarrhea or loose stools. 30 capsule 0   metoprolol succinate (TOPROL XL) 25 MG 24 hr tablet Take 0.5 tablets (12.5 mg total) by mouth every evening. 45 tablet 3   Multiple Vitamins-Minerals (MULTIVITAMIN WITH MINERALS) tablet Take 1 tablet by mouth daily.     oxybutynin (DITROPAN XL) 15 MG 24 hr tablet Take 15 mg by mouth at bedtime.     pantoprazole (PROTONIX) 40 MG tablet Take 1 tablet (40 mg total) by mouth 2 (two) times daily. 60 tablet 0   Potassium Chloride ER 20 MEQ TBCR Take 1 tablet by mouth 2 (two) times daily.     VITAMIN D PO Take 1 capsule by mouth daily.     No current facility-administered medications for this encounter.    Allergies  Allergen Reactions   Morphine And Related Other (See Comments)    Hallucinations    Septra [Sulfamethoxazole-Trimethoprim] Other (See Comments)    Gi upset    Sulfamethoxazole Other (See Comments)    Gi upset    Trimethoprim Other (See Comments)    Unknown    Latex Swelling and Rash      Social History   Socioeconomic History   Marital status: Single    Spouse name: Not on file   Number of children: 0   Years of education: Not on file   Highest education level: Not on file  Occupational History   Occupation: Help worker at [employer]  Tobacco Use   Smoking status: Never   Smokeless tobacco: Never  Vaping Use   Vaping Use: Never used  Substance and Sexual Activity    Alcohol use: Not Currently    Comment: occ   Drug use: Never   Sexual activity: Not on file  Other Topics Concern   Not on file  Social History Narrative   Not on file   Social Determinants of Health   Financial Resource Strain: Low Risk  (10/06/2022)   Overall Financial Resource Strain (CARDIA)    Difficulty of Paying Living Expenses: Not very hard  Food Insecurity: No Food Insecurity (10/03/2022)   Hunger Vital Sign    Worried About Running Out of Food in  the Last Year: Never true    Ran Out of Food in the Last Year: Never true  Transportation Needs: No Transportation Needs (10/06/2022)   PRAPARE - Administrator, Civil Service (Medical): No    Lack of Transportation (Non-Medical): No  Physical Activity: Not on file  Stress: Not on file  Social Connections: Not on file  Intimate Partner Violence: Not At Risk (10/03/2022)   Humiliation, Afraid, Rape, and Kick questionnaire    Fear of Current or Ex-Partner: No    Emotionally Abused: No    Physically Abused: No    Sexually Abused: No      Family History  Problem Relation Age of Onset   Heart attack Maternal Grandfather    Heart attack Paternal Grandfather     Vitals:   10/24/22 1133  BP: 114/85  Pulse: 98  SpO2: 95%  Weight: 50.8 kg (112 lb 1.3 oz)   . Wt Readings from Last 3 Encounters:  10/24/22 50.8 kg (112 lb 1.3 oz)  10/20/22 51.3 kg (113 lb)  10/07/22 46.7 kg (103 lb)    PHYSICAL EXAM: General:  Arrived in his wheelchair. No respiratory difficulty HEENT: normal Neck: supple. no JVD. Carotids 2+ bilat; no bruits. No lymphadenopathy or thryomegaly appreciated. Cor: PMI nondisplaced. Regular rate & rhythm. No rubs, gallops or murmurs. Lungs: clear Abdomen: soft, nontender, nondistended. No hepatosplenomegaly. No bruits or masses. Good bowel sounds. Extremities: no cyanosis, clubbing, rash, Bilateral lower extremity splint and compression stockinngs.  Neuro: alert & oriented x 3, cranial nerves  grossly intact. moves all 4 extremities w/o difficulty. Affect pleasant.  ECG:SR 94 bpm QRS 74 ms.    ASSESSMENT & PLAN: 1. HFrEF Echo EF 35-40% RV mildly reduced. Etiology unclear. Recent CAP. Possible viral versus stress induced. Plan repeat ECHO in 3 months after HF meds titrated.  NYHA II. Functional improvement.  GDMT  Diuretic- Volume status stable. Adding Cleda Daub today.  Cut back lasix to 40 mg daily and cut back Kdur to  20 meq daily. Would like to avoid BID diuretics because he I & O caths multiple times a day so hopefully this will reduce night time caths.  BB- Continue Toprol XL 12.5 mg daily  Ace/ARB/ARNI- Hold off consider at next visit.  MRA- Add 12.5 mg spironolactone daily. Check BMET next week.  SGLT2i- no-->neurogenic bladder and self caths .   2. H/O Respiratory Failure Recent CAP. Completed antibiotics.  Now on room air. Sats stable. Using oxygen as needed.  He has follow up with pulmonary.   3. Bicuspid Aortic Valve Trivial AI.  Need Echo every 1-2 years.    Referred to HFSW (PCP, Medications, Transportation, ETOH Abuse, Drug Abuse, Insurance, Financial ):  No Refer to Pharmacy: No Refer to Home Health: No Refer to Advanced Heart Failure Clinic: NO  Refer to General Cardiology: Dr Malachy Mood patient.   Asked him to follow BP/weights at home. IF   Follow up in 2 weeks for med titration then back to general cardiology. Greater than 50% of the (total minutes 45) visit spent in counseling/coordination of care regarding the above. Discussed GDMT, HF, and plan for repeating ECHO.  Janea Schwenn NP-C  2:36 PM

## 2022-10-27 ENCOUNTER — Encounter (HOSPITAL_COMMUNITY): Payer: Medicare Other

## 2022-11-03 ENCOUNTER — Encounter (HOSPITAL_BASED_OUTPATIENT_CLINIC_OR_DEPARTMENT_OTHER): Payer: Self-pay | Admitting: Pulmonary Disease

## 2022-11-03 ENCOUNTER — Ambulatory Visit (INDEPENDENT_AMBULATORY_CARE_PROVIDER_SITE_OTHER): Payer: Medicare Other

## 2022-11-03 ENCOUNTER — Encounter (HOSPITAL_BASED_OUTPATIENT_CLINIC_OR_DEPARTMENT_OTHER): Payer: Medicare Other

## 2022-11-03 ENCOUNTER — Ambulatory Visit (INDEPENDENT_AMBULATORY_CARE_PROVIDER_SITE_OTHER): Payer: Medicare Other | Admitting: Pulmonary Disease

## 2022-11-03 ENCOUNTER — Other Ambulatory Visit (HOSPITAL_COMMUNITY): Payer: Self-pay | Admitting: Adult Health

## 2022-11-03 VITALS — BP 110/86 | HR 108 | Temp 99.5°F | Ht 64.0 in | Wt 112.2 lb

## 2022-11-03 DIAGNOSIS — J9621 Acute and chronic respiratory failure with hypoxia: Secondary | ICD-10-CM

## 2022-11-03 DIAGNOSIS — J189 Pneumonia, unspecified organism: Secondary | ICD-10-CM | POA: Diagnosis not present

## 2022-11-03 DIAGNOSIS — J9622 Acute and chronic respiratory failure with hypercapnia: Secondary | ICD-10-CM

## 2022-11-03 NOTE — Progress Notes (Unsigned)
Subjective:   PATIENT ID: Samuel Becker GENDER: male DOB: Nov 08, 1990, MRN: [id number]  Chief Complaint  Patient presents with   Consult    Consult.     Reason for Visit: Hospital follow-up  Samuel Becker is a 32 year old wheelchair bound male never smoker with spina bifida, functional paraplegia, neurogenic bladder, GERD who presents for follow-up. As baseline he is independent and able to self-transfer. Routinely volunteers. Presents with parents who provide additional pertinent history.  He was recently hospitalized from 4/3-4/9 and readmitted 10/02/22-10/07/22 for pneumonia (discharge summaries reviewed) and heart failure requiring diuresis and antibiotics. Discharged on 2L. He currently only wears oxygen at night. During the daytime SpO2 on his wrist monitor is >90%. Denies shortness of breath, cough and wheezing. Back to baseline activity including upper body exercises and independent of ADLs.  Social History: Rarely smokes cigars  I have personally reviewed patient's past medical/family/social history, allergies, current medications.  Past Medical History:  Diagnosis Date   Spina bifida (HCC)      Family History  Problem Relation Age of Onset   Heart attack Maternal Grandfather    Heart attack Paternal Grandfather      Social History   Occupational History   Occupation: Acupuncturist at [employer]  Tobacco Use   Smoking status: Never   Smokeless tobacco: Never  Vaping Use   Vaping Use: Never used  Substance and Sexual Activity   Alcohol use: Not Currently    Comment: occ   Drug use: Never   Sexual activity: Not on file    Allergies  Allergen Reactions   Morphine And Codeine Other (See Comments)    Hallucinations    Septra [Sulfamethoxazole-Trimethoprim] Other (See Comments)    Gi upset    Sulfamethoxazole Other (See Comments)    Gi upset    Trimethoprim Other (See Comments)    Unknown    Latex Swelling and Rash     Outpatient Medications  Prior to Visit  Medication Sig Dispense Refill   acetaminophen (TYLENOL) 325 MG tablet Take 325 mg by mouth as needed for moderate pain.     albuterol (PROVENTIL) (2.5 MG/3ML) 0.083% nebulizer solution Take 3 mLs (2.5 mg total) by nebulization every 6 (six) hours as needed for wheezing or shortness of breath. 75 mL 1   cetirizine (ZYRTEC) 10 MG tablet Take 10 mg by mouth daily.     furosemide (LASIX) 40 MG tablet Take 1 tablet (40 mg total) by mouth daily. 90 tablet 4   guaiFENesin (ROBITUSSIN) 100 MG/5ML liquid Take 5 mLs by mouth every 4 (four) hours as needed for cough or to loosen phlegm. 120 mL 0   loperamide (IMODIUM) 2 MG capsule Take 1 capsule (2 mg total) by mouth 4 (four) times daily as needed for diarrhea or loose stools. 30 capsule 0   metoprolol succinate (TOPROL XL) 25 MG 24 hr tablet Take 0.5 tablets (12.5 mg total) by mouth every evening. 45 tablet 3   Multiple Vitamins-Minerals (MULTIVITAMIN WITH MINERALS) tablet Take 1 tablet by mouth daily.     oxybutynin (DITROPAN XL) 15 MG 24 hr tablet Take 15 mg by mouth at bedtime.     pantoprazole (PROTONIX) 40 MG tablet Take 1 tablet (40 mg total) by mouth 2 (two) times daily. 60 tablet 0   Potassium Chloride ER 20 MEQ TBCR Take 1 tablet (20 mEq total) by mouth daily. 90 tablet 3   spironolactone (ALDACTONE) 25 MG tablet Take 0.5 tablets (12.5 mg  total) by mouth daily. 180 tablet 3   VITAMIN D PO Take 1 capsule by mouth daily.     No facility-administered medications prior to visit.    Review of Systems  Constitutional:  Negative for chills, diaphoresis, fever, malaise/fatigue and weight loss.  HENT:  Negative for congestion.   Respiratory:  Negative for cough, hemoptysis, sputum production, shortness of breath and wheezing.   Cardiovascular:  Negative for chest pain, palpitations and leg swelling.     Objective:   Vitals:   11/03/22 1309  BP: 110/86  Pulse: (!) 108  Temp: 99.5 F (37.5 C)  TempSrc: Oral  SpO2: 98%   Weight: 112 lb 3.2 oz (50.9 kg)  Height: 5\' 4"  (1.626 m)   SpO2: 98 % O2 Device: None (Room air)  Physical Exam: General: Well-appearing, thin male, no acute distress, paraplegic, wheelchairbound HENT: [place], AT Eyes: EOMI, no scleral icterus Respiratory: Clear to auscultation bilaterally.  No crackles, wheezing or rales Cardiovascular: RRR, -M/R/G, no JVD Extremities:LE muscle wasting, -Edema,-tenderness Neuro: AAO x4, CNII-XII grossly intact Psych: Normal mood, normal affect  Data Reviewed:  Imaging: CXR 10/07/22 - Diffuse bilateral infiltrates CXR 11/03/22 Improved/nearly resolved infiltrates  PFT: None on file  Labs: ABG    Component Value Date/Time   PHART 7.223 (L) 09/20/2022 2031   PCO2ART 69.2 (HH) 09/20/2022 2031   PO2ART 55 (L) 09/20/2022 2031   HCO3 32.0 (H) 10/02/2022 1848   TCO2 30 09/20/2022 2031   ACIDBASEDEF 1.0 09/20/2022 2031   O2SAT 68.5 10/02/2022 1848   VBG 10/02/22 7.3/65/35 - chronic respiratory failure      Assessment & Plan:   Discussion: 32 year old wheelchair bound male never smoker with spina bifida, functional paraplegia, neurogenic bladder, GERD who presents for hospital follow-up. Acute issues resolved. Will need to determine home O2 requirement. May need NIV. Qualifies at this point however if BMET normalized, may need to repeat ABG. Would benefit from BiPAP vs nocturnal vent in setting of neuromuscular weakness.  Chronic hypoxemic and hypercarbic respiratory failure --Please send basic metabolic panel results to evaluate CO2 --ORDER ONO (overnight oximetry) on room air. Send mychart message to our office when test is completed --ORDER non-invasive ventilation nightly. Addendum: Hold on this as we need to determine BiPAP vs Trilogy vent --ORDER pulmonary function test  Please let [facility] know that patient needs either nocturnal ventilation or BiPAP for chronic hypercarbic respiratory failure  Patient's chronic respiratory failure due to  diaphragmatic weakness in setting to paraplegia is life threatening.  Previous ABG's have documented high PCO2. Patient would benefit from non-invasive ventilation.  Without this therapy, the patient is at high risk of ending up with worsening symptoms, worsened respiratory failure, need for ER visits and/or recurrent hospitalizations.  Bilevel device unable to adequately support patient's nocturnal ventilation needs.  Patient would benefit from NIV therapy with set tidal volumes and pressure.   Health Maintenance There is no immunization history for the selected administration types on file for this patient. CT Lung Screen - not qualified  Orders Placed This Encounter  Procedures   DG Chest 2 View    Standing Status:   Future    Standing Expiration Date:   11/03/2023    Order Specific Question:   Reason for Exam (SYMPTOM  OR DIAGNOSIS REQUIRED)    Answer:   bilateral infiltrate    Order Specific Question:   Preferred imaging location?    Answer:   [facility]   Pulse oximetry, overnight    Standing  Status:   Future    Standing Expiration Date:   11/03/2023    Scheduling Instructions:     On room air   Pulmonary function test    Standing Status:   Future    Standing Expiration Date:   11/03/2023    Order Specific Question:   Where should this test be performed?    Answer:   [facility]    Order Specific Question:   Full PFT: includes the following: basic spirometry, spirometry pre & post bronchodilator, diffusion capacity (DLCO), lung volumes    Answer:   Full PFT  No orders of the defined types were placed in this encounter.   Return for after PFT when next available.  I have spent a total time of 45-minutes on the day of the appointment reviewing prior documentation, coordinating care and discussing medical diagnosis and plan with the patient/family. Imaging, labs and tests included in this note have been reviewed and interpreted independently by me.  Yeraldine Forney Mechele Collin, MD [facility] 11/03/2022 1:42 PM  Office Number [phone]

## 2022-11-03 NOTE — Patient Instructions (Addendum)
Chronic hypoxemic and hypercarbic respiratory failure --Please send basic metabolic panel results to evaluate CO2 --ORDER ONO (overnight oximetry) on room air. Send mychart message to our office when test is completed --ORDER non-invasive ventilation nightly --ORDER pulmonary function test  Please let [facility] know that patient needs either nocturnal ventilation or BiPAP for chronic hypercarbic respiratory failure

## 2022-11-04 LAB — BASIC METABOLIC PANEL
BUN/Creatinine Ratio: 22 — ABNORMAL HIGH (ref 9–20)
BUN: 20 mg/dL (ref 6–20)
CO2: 25 mmol/L (ref 20–29)
Calcium: 10.4 mg/dL — ABNORMAL HIGH (ref 8.7–10.2)
Chloride: 97 mmol/L (ref 96–106)
Creatinine, Ser: 0.93 mg/dL (ref 0.76–1.27)
Glucose: 73 mg/dL (ref 70–99)
Potassium: 4.3 mmol/L (ref 3.5–5.2)
Sodium: 141 mmol/L (ref 134–144)
eGFR: 112 mL/min/{1.73_m2} (ref >59)

## 2022-11-04 LAB — SPECIMEN STATUS REPORT

## 2022-11-05 ENCOUNTER — Encounter: Payer: Self-pay | Admitting: Pulmonary Disease

## 2022-11-05 ENCOUNTER — Encounter (HOSPITAL_BASED_OUTPATIENT_CLINIC_OR_DEPARTMENT_OTHER): Payer: Self-pay | Admitting: Pulmonary Disease

## 2022-11-05 DIAGNOSIS — J9612 Chronic respiratory failure with hypercapnia: Secondary | ICD-10-CM

## 2022-11-05 NOTE — Telephone Encounter (Signed)
Please schedule patient for ABG testing at [facility]

## 2022-11-06 NOTE — Telephone Encounter (Signed)
Left vm to RT to get this scheduled

## 2022-11-07 NOTE — Telephone Encounter (Signed)
Patient message not read in mychart. Called and spoke with patient's father regarding ABG test results and need to repeat ABG to confirm chronic hypercarbic respiratory failure. Addressed questions and expressed understanding.

## 2022-11-09 NOTE — Progress Notes (Signed)
HEART IMPACT TRANSITIONS OF CARE   Primary Care: Moshe Cipro NP  Primary Cardiologist: Dr Mayford Knife   HPI: Mr Samuel Becker is 32 year old with a history of spina bifida, paraplegia, neurogenic bladder, GERD, and recently diagnosed acute heart failure.    Admitted 09/20/22 with CAP and acute hypoxic respiratory failure. Treated with antibiotics and discharged on 09/26/2022   Readmitted 10/02/22 with acute respiratory failure and acute HFrEF.  Echo showed EF 35-40%. Coronary CT ok. Not placed on SGLT2i due to neurogenic bladder. Diuresed with IV lasix. Transitioned to lasix 40 mg po daily. He was not placed on GDMT  due to soft BP. Discharged on oxygen as needed. Discharge date 10/07/22.    At PCP with [facility]. 10/19/22: Creatinine 0.8 (normal), K+ 4.5 (normal) ALT 13 (normal) Hgb 13.9 (normal).    Saw Tereso Newcomer PA-C and was started on Toprol XL 12.5 mg daily and lasix was increased to 40 mg bid.     I saw him in the [facility] clinic 10/23/21. Started on 12.5 mg spironolactone daily. Had repeat BMET 11/03/22 K 4.3 and Creatinine 0.93.  Today he returns for HF follow up with his step mother. Last visit spironolactone was added. Overall feeling fine. Feeling stronger than his last visit. Continue to use oxygen at night. Denies SOB/PND/Orthopnea. He has routine bowel and bladder program. Appetite ok. No fever or chills. Weight at home 109  pounds. Taking all medications. Wants to start volunterring again at [employer] .   SDOH: Lives with his Dad and stepmother. He has a caregiver 5 days a week.    Cardiac Testing  Coronary CT 09/2022  1. Mild aortic root dilatation of 4.1 cm. 2. Mild right lower lobe atelectasis.     Echo 09/2022   1. Left ventricular ejection fraction, by estimation, is 35 to 40%. The  left ventricle has moderately decreased function. The left ventricle  demonstrates global hypokinesis. Left ventricular diastolic parameters are  indeterminate. Elevated left atrial  pressure.   2.  Right ventricular systolic function is mildly reduced. The right  ventricular size is moderately enlarged.   3. Left atrial size was moderately dilated.   4. Right atrial size was moderately dilated.   5. The mitral valve was not well visualized. No evidence of mitral valve  regurgitation. No evidence of mitral stenosis.   6. Siever's Bicuspid Valve with mild Sinus of Valsalva Dilation and R-L  fusiona and raphe. The aortic valve is bicuspid. Aortic valve  regurgitation is trivial.   7. Aortic dilatation noted. There is mild dilatation of the aortic root,  measuring 43 mm.    ROS: All systems negative except as listed in HPI, PMH and Problem List.  SH:  Social History   Socioeconomic History   Marital status: Single    Spouse name: Not on file   Number of children: 0   Years of education: Not on file   Highest education level: Not on file  Occupational History   Occupation: Help worker at [employer]  Tobacco Use   Smoking status: Never   Smokeless tobacco: Never  Vaping Use   Vaping Use: Never used  Substance and Sexual Activity   Alcohol use: Not Currently    Comment: occ   Drug use: Never   Sexual activity: Not on file  Other Topics Concern   Not on file  Social History Narrative   Not on file   Social Determinants of Health   Financial Resource Strain: Low Risk  (  10/06/2022)   Overall Financial Resource Strain (CARDIA)    Difficulty of Paying Living Expenses: Not very hard  Food Insecurity: No Food Insecurity (10/03/2022)   Hunger Vital Sign    Worried About Running Out of Food in the Last Year: Never true    Ran Out of Food in the Last Year: Never true  Transportation Needs: No Transportation Needs (10/06/2022)   PRAPARE - Administrator, Civil Service (Medical): No    Lack of Transportation (Non-Medical): No  Physical Activity: Not on file  Stress: Not on file  Social Connections: Not on file  Intimate Partner Violence: Not At Risk (10/03/2022)    Humiliation, Afraid, Rape, and Kick questionnaire    Fear of Current or Ex-Partner: No    Emotionally Abused: No    Physically Abused: No    Sexually Abused: No    FH:  Family History  Problem Relation Age of Onset   Heart attack Maternal Grandfather    Heart attack Paternal Grandfather     Past Medical History:  Diagnosis Date   Spina bifida (HCC)     Current Outpatient Medications  Medication Sig Dispense Refill   acetaminophen (TYLENOL) 325 MG tablet Take 325 mg by mouth as needed for moderate pain.     albuterol (PROVENTIL) (2.5 MG/3ML) 0.083% nebulizer solution Take 3 mLs (2.5 mg total) by nebulization every 6 (six) hours as needed for wheezing or shortness of breath. 75 mL 1   cetirizine (ZYRTEC) 10 MG tablet Take 10 mg by mouth daily.     furosemide (LASIX) 40 MG tablet Take 1 tablet (40 mg total) by mouth daily. 90 tablet 4   guaiFENesin (ROBITUSSIN) 100 MG/5ML liquid Take 5 mLs by mouth every 4 (four) hours as needed for cough or to loosen phlegm. 120 mL 0   loperamide (IMODIUM) 2 MG capsule Take 1 capsule (2 mg total) by mouth 4 (four) times daily as needed for diarrhea or loose stools. 30 capsule 0   metoprolol succinate (TOPROL XL) 25 MG 24 hr tablet Take 0.5 tablets (12.5 mg total) by mouth every evening. 45 tablet 3   Multiple Vitamins-Minerals (MULTIVITAMIN WITH MINERALS) tablet Take 1 tablet by mouth daily.     oxybutynin (DITROPAN XL) 15 MG 24 hr tablet Take 15 mg by mouth at bedtime.     pantoprazole (PROTONIX) 40 MG tablet Take 1 tablet (40 mg total) by mouth 2 (two) times daily. 60 tablet 0   Potassium Chloride ER 20 MEQ TBCR Take 1 tablet (20 mEq total) by mouth daily. 90 tablet 3   spironolactone (ALDACTONE) 25 MG tablet Take 0.5 tablets (12.5 mg total) by mouth daily. 180 tablet 3   VITAMIN D PO Take 1 capsule by mouth daily.     No current facility-administered medications for this encounter.    Vitals:   11/10/22 1455  BP: 94/70  Pulse: 91  SpO2: 92%   Weight: 49.8 kg (109 lb 12.8 oz)   Wt Readings from Last 3 Encounters:  11/10/22 49.8 kg (109 lb 12.8 oz)  11/03/22 50.9 kg (112 lb 3.2 oz)  10/24/22 50.8 kg (112 lb 1.3 oz)    PHYSICAL EXAM: General:  Well appearing. No resp difficulty. Arrived in his wheel chair.  HEENT: normal Neck: supple. JVP flat. Carotids 2+ bilaterally; no bruits. No lymphadenopathy or thryomegaly appreciated. Cor: PMI normal. Regular rate & rhythm. No rubs, gallops or murmurs. Lungs: clear Abdomen: soft, nontender, nondistended. No hepatosplenomegaly. No bruits or masses.  Good bowel sounds. Extremities: no cyanosis, clubbing, rash, edema Neuro: alert & orientedx3, cranial nerves grossly intact. Moves all 4 extremities w/o difficulty. Affect pleasant.   ASSESSMENT & PLAN:  1. HFrEF Echo EF 35-40% RV mildly reduced. Etiology unclear. Recent CAP. Possible viral versus stress induced. Plan repeat ECHO in 3 months after HF meds titrated.  NYHA II.  GDMT limited by hypotension. Will need to continue current regimen. Suspect he will not tolerate additional titrations.  Diuretic- Volume status stable. Avoid BID diuretics because he I & O caths multiple times a day so hopefully this will reduce night time caths.  BB- Continue Toprol XL 12.5 mg daily  Ace/ARB/ARNI- Hold off   MRA- Continue 12.5 mg spironolactone daily.  SGLT2i- no-->neurogenic bladder and self caths .    2. H/O Respiratory Failure Recent CAP. Completed antibiotics.  Now on room air. Sats stable.  Using oxygen at night.   He has follow up with pulmonary.    3. Bicuspid Aortic Valve Trivial AI.  Need Echo every 1-2 years.      Follow up HF clinic as needed. Back to [facility].   Inas Avena 3:42 PM   Allanah Mcfarland NP-C

## 2022-11-10 ENCOUNTER — Encounter (HOSPITAL_COMMUNITY): Payer: Self-pay

## 2022-11-10 ENCOUNTER — Ambulatory Visit (HOSPITAL_COMMUNITY)
Admission: RE | Admit: 2022-11-10 | Discharge: 2022-11-10 | Disposition: A | Discharge status: Home / Self Care | Payer: Medicare Other | Source: Ambulatory Visit | Attending: Adult Health | Admitting: Adult Health

## 2022-11-10 VITALS — BP 94/70 | HR 91 | Wt 109.8 lb

## 2022-11-10 DIAGNOSIS — K219 Gastro-esophageal reflux disease without esophagitis: Secondary | ICD-10-CM | POA: Diagnosis not present

## 2022-11-10 DIAGNOSIS — Z993 Dependence on wheelchair: Secondary | ICD-10-CM | POA: Diagnosis not present

## 2022-11-10 DIAGNOSIS — I5020 Unspecified systolic (congestive) heart failure: Secondary | ICD-10-CM | POA: Diagnosis not present

## 2022-11-10 DIAGNOSIS — Q059 Spina bifida, unspecified: Secondary | ICD-10-CM | POA: Diagnosis not present

## 2022-11-10 DIAGNOSIS — I959 Hypotension, unspecified: Secondary | ICD-10-CM | POA: Diagnosis not present

## 2022-11-10 DIAGNOSIS — Q231 Congenital insufficiency of aortic valve: Secondary | ICD-10-CM | POA: Diagnosis not present

## 2022-11-10 DIAGNOSIS — G8220 Paraplegia, unspecified: Secondary | ICD-10-CM | POA: Diagnosis not present

## 2022-11-10 DIAGNOSIS — I5022 Chronic systolic (congestive) heart failure: Secondary | ICD-10-CM | POA: Diagnosis not present

## 2022-11-10 DIAGNOSIS — N319 Neuromuscular dysfunction of bladder, unspecified: Secondary | ICD-10-CM | POA: Insufficient documentation

## 2022-11-10 DIAGNOSIS — Z79899 Other long term (current) drug therapy: Secondary | ICD-10-CM | POA: Diagnosis not present

## 2022-11-10 DIAGNOSIS — Z9981 Dependence on supplemental oxygen: Secondary | ICD-10-CM | POA: Insufficient documentation

## 2022-11-10 DIAGNOSIS — I5021 Acute systolic (congestive) heart failure: Secondary | ICD-10-CM | POA: Insufficient documentation

## 2022-11-10 NOTE — Patient Instructions (Signed)
Medication Changes:  We recommend that you continue on your current medications as directed. Please refer to the Current Medication list given to you today.   *If you need a refill on your cardiac medications before your next appointment, please call your pharmacy*   Follow-Up in:   You can schedule an appointment with [place] as needed!   Do the following things EVERYDAY: Weigh yourself in the morning before breakfast. Write it down and keep it in a log. Take your medicines as prescribed Eat low salt foods--Limit salt (sodium) to 2000 mg per day.  Stay as active as you can everyday Limit all fluids for the day to less than 2 liters    Need to Contact us:  If you have any questions or concerns before your next appointment please send [place] a message through [place] or call our office at [phone].    TO LEAVE A MESSAGE FOR THE NURSE SELECT OPTION 2, PLEASE LEAVE A MESSAGE INCLUDING: YOUR NAME DATE OF BIRTH CALL BACK NUMBER REASON FOR CALL**this is important as we prioritize the call backs  YOU WILL RECEIVE A CALL BACK THE SAME DAY AS LONG AS YOU CALL BEFORE 4:00 PM

## 2022-11-16 NOTE — Addendum Note (Signed)
Addended by: Luciano Cutter on: 11/16/2022 02:49 PM   Modules accepted: Orders

## 2022-11-19 NOTE — Progress Notes (Unsigned)
  Cardiology Office Note:    Date:  11/20/2022  ID:  Samuel Becker, DOB 1991/05/13, MRN [id number] PCP: Moshe Cipro, NP  [facility] Providers Cardiologist:  Armanda Magic, MD         Patient Profile:      (HFrEF) heart failure with reduced ejection fraction Non-ischemic cardiomyopathy  TTE 10/03/22: EF 35-40, global HK, mildly reduced RVSF, mod BAE, Siever's Bicuspid AV, mild SoV dilation, R-L fusion and raphe, trivial AI, Ao root 43 mm, RAP 3 CCTA 10/05/22:  mild Ao root dilations (4.1 cm); CAC score 0; no CAD; total plaque volume < 1% Intol fo Entresto due to ? BP Bicuspid Aortic Valve Mild SoV dilation Spina bifida  Neurogenic bladder         History of Present Illness:   Samuel Becker is a 32 y.o. male who returns for f/u of CHF. He was last seen 10/20/22. He had 2 follow ups in the CHF clinic. He was started on Spironolactone 12.5 mg once daily. He does not have planned f/u in the CHF clinic. He is here today with his father. He has not had chest pain, shortness of breath, syncope.    ROS  See HPI    Studies Reviewed:    EKG:  not done  Risk Assessment/Calculations:             Physical Exam:   VS:  BP 102/74   Pulse 100   Ht 5\' 4"  (1.626 m)   Wt 111 lb (50.3 kg)   SpO2 98%   BMI 19.05 kg/m    Wt Readings from Last 3 Encounters:  11/20/22 111 lb (50.3 kg)  11/10/22 109 lb 12.8 oz (49.8 kg)  11/03/22 112 lb 3.2 oz (50.9 kg)    Constitutional:      Appearance: Not in distress.     Comments: In a wheelchair  Neck:     Vascular: No JVR.  Pulmonary:     Breath sounds: Normal breath sounds. No wheezing. No rales.  Cardiovascular:     Normal rate. Regular rhythm.     Murmurs: There is no murmur.  Edema:    Peripheral edema absent.        ASSESSMENT AND PLAN:   HFrEF (heart failure with reduced ejection fraction) (HCC) Non-ischemic cardiomyopathy. EF 35-40. Question if cardiomyopathy due to acute illness from pneumonia and sepsis.  Hopefully, he will have full recovery of LVF. He is more active now. He is off O2 except at night. He is getting back to going around some of the loop at [facility]. His BP continues to run low and will limit GDMT. His HR ~ 100. I think he can tolerate increasing his beta-blocker dose. I do not think he will be able to tolerate ACE/ARB. He should not be placed on SGLT2i given neurogenic bladder. Continue furosemide 40 mg daily, K+ 20 mEq daily, spironolactone 12.5 mg daily Increase metoprolol succinate to 25 mg daily Arrange limited echo in 3 to 4 weeks to recheck EF Follow-up 6 months  Bicuspid aortic valve Consider repeat echocardiogram in 1 year.  Dilated aortic root (HCC) Consider repeat echocardiogram 1 year.       Dispo:  Return in about 6 months (around 05/22/2023) for Routine Follow Up w/ Dr. Mayford Knife.  Signed, Tereso Newcomer, PA-C

## 2022-11-20 ENCOUNTER — Ambulatory Visit: Payer: Medicare Other | Attending: Physician Assistant | Admitting: Physician Assistant

## 2022-11-20 ENCOUNTER — Encounter: Payer: Self-pay | Admitting: Physician Assistant

## 2022-11-20 VITALS — BP 102/74 | HR 100 | Ht 64.0 in | Wt 111.0 lb

## 2022-11-20 DIAGNOSIS — I77810 Thoracic aortic ectasia: Secondary | ICD-10-CM | POA: Diagnosis present

## 2022-11-20 DIAGNOSIS — Q231 Congenital insufficiency of aortic valve: Secondary | ICD-10-CM | POA: Diagnosis present

## 2022-11-20 DIAGNOSIS — I5020 Unspecified systolic (congestive) heart failure: Secondary | ICD-10-CM | POA: Diagnosis present

## 2022-11-20 MED ORDER — METOPROLOL SUCCINATE ER 25 MG PO TB24: 25.0000 mg | ORAL_TABLET | Freq: Every day | ORAL | 2 refills | Status: DC

## 2022-11-20 NOTE — Patient Instructions (Signed)
Medication Instructions:   Start Taking :Toprol XL  25  mg once a day   *If you need a refill on your cardiac medications before your next appointment, please call your pharmacy*   Lab Work:  NONE ORDERED  TODAY   If you have labs (blood work) drawn today and your tests are completely normal, you will receive your results only by: MyChart Message (if you have MyChart) OR A paper copy in the mail If you have any lab test that is abnormal or we need to change your treatment, we will call you to review the results.   Testing/Procedures: Your physician has requested that you have an echocardiogram. Echocardiography is a painless test that uses sound waves to create images of your heart. It provides your doctor with information about the size and shape of your heart and how well your heart's chambers and valves are working. This procedure takes approximately one hour. There are no restrictions for this procedure. Please do NOT wear cologne, perfume, aftershave, or lotions (deodorant is allowed). Please arrive 15 minutes prior to your appointment time.    Follow-Up: At [facility], you and your health needs are our priority.  As part of our continuing mission to provide you with exceptional heart care, we have created designated Provider Care Teams.  These Care Teams include your primary Cardiologist (physician) and Advanced Practice Providers (APPs -  Physician Assistants and Nurse Practitioners) who all work together to provide you with the care you need, when you need it.  We recommend signing up for the patient portal called "MyChart".  Sign up information is provided on this After Visit Summary.  MyChart is used to connect with patients for Virtual Visits (Telemedicine).  Patients are able to view lab/test results, encounter notes, upcoming appointments, etc.  Non-urgent messages can be sent to your provider as well.   To learn more about what you can do with MyChart, go to  [email or web address].    Your next appointment:    6 month(s)  Provider:   Armanda Magic, MD   Other Instructions

## 2022-11-20 NOTE — Assessment & Plan Note (Addendum)
Consider repeat echocardiogram in 1 year.

## 2022-11-20 NOTE — Assessment & Plan Note (Signed)
Non-ischemic cardiomyopathy. EF 35-40. Question if cardiomyopathy due to acute illness from pneumonia and sepsis. Hopefully, he will have full recovery of LVF. He is more active now. He is off O2 except at night. He is getting back to going around some of the loop at [facility]. His BP continues to run low and will limit GDMT. His HR ~ 100. I think he can tolerate increasing his beta-blocker dose. I do not think he will be able to tolerate ACE/ARB. He should not be placed on SGLT2i given neurogenic bladder. Continue furosemide 40 mg daily, K+ 20 mEq daily, spironolactone 12.5 mg daily Increase metoprolol succinate to 25 mg daily Arrange limited echo in 3 to 4 weeks to recheck EF Follow-up 6 months

## 2022-11-20 NOTE — Assessment & Plan Note (Signed)
Consider repeat echocardiogram 1 year.

## 2022-11-22 ENCOUNTER — Other Ambulatory Visit (HOSPITAL_COMMUNITY): Payer: Self-pay | Admitting: Adult Health

## 2022-11-30 ENCOUNTER — Ambulatory Visit (HOSPITAL_COMMUNITY)
Admission: RE | Admit: 2022-11-30 | Discharge: 2022-11-30 | Disposition: A | Discharge status: Home / Self Care | Payer: Medicare Other | Source: Ambulatory Visit | Attending: Pulmonary Disease | Admitting: Pulmonary Disease

## 2022-11-30 DIAGNOSIS — J9622 Acute and chronic respiratory failure with hypercapnia: Secondary | ICD-10-CM | POA: Diagnosis present

## 2022-11-30 DIAGNOSIS — J9612 Chronic respiratory failure with hypercapnia: Secondary | ICD-10-CM

## 2022-11-30 DIAGNOSIS — J9621 Acute and chronic respiratory failure with hypoxia: Secondary | ICD-10-CM | POA: Diagnosis present

## 2022-11-30 LAB — BLOOD GAS, ARTERIAL
Acid-Base Excess: 7.1 mmol/L — ABNORMAL HIGH (ref 0.0–2.0)
Bicarbonate: 32 mmol/L — ABNORMAL HIGH (ref 20.0–28.0)
Drawn by: 21179
O2 Saturation: 97.8 %
Patient temperature: 37
pCO2 arterial: 45 mmHg (ref 32–48)
pH, Arterial: 7.46 — ABNORMAL HIGH (ref 7.35–7.45)
pO2, Arterial: 79 mmHg — ABNORMAL LOW (ref 83–108)

## 2022-11-30 LAB — PULMONARY FUNCTION TEST
DL/VA % pred: 126 %
DL/VA: 6.49 ml/min/mmHg/L
DLCO unc % pred: 71 %
DLCO unc: 15.98 ml/min/mmHg
FEF 25-75 Post: 1.88 L/sec
FEF 25-75 Pre: 1.43 L/sec
FEF2575-%Change-Post: 31 %
FEF2575-%Pred-Post: 54 %
FEF2575-%Pred-Pre: 41 %
FEV1-%Change-Post: -6 %
FEV1-%Pred-Post: 35 %
FEV1-%Pred-Pre: 38 %
FEV1-Post: 1.12 L
FEV1-Pre: 1.2 L
FEV1FVC-%Change-Post: 3 %
FEV1FVC-%Pred-Pre: 116 %
FEV6-%Change-Post: -10 %
FEV6-%Pred-Post: 30 %
FEV6-%Pred-Pre: 34 %
FEV6-Post: 1.14 L
FEV6-Pre: 1.27 L
FEV6FVC-%Pred-Post: 101 %
FEV6FVC-%Pred-Pre: 101 %
FVC-%Change-Post: -10 %
FVC-%Pred-Post: 30 %
FVC-%Pred-Pre: 33 %
FVC-Post: 1.14 L
FVC-Pre: 1.27 L
Post FEV1/FVC ratio: 99 %
Post FEV6/FVC ratio: 100 %
Pre FEV1/FVC ratio: 95 %
Pre FEV6/FVC Ratio: 100 %
RV % pred: 103 %
RV: 1.1 L
TLC % pred: 50 %
TLC: 2.38 L

## 2022-11-30 MED ORDER — ALBUTEROL SULFATE (2.5 MG/3ML) 0.083% IN NEBU
2.5000 mg | INHALATION_SOLUTION | Freq: Once | RESPIRATORY_TRACT | Status: AC
  Administered 2022-11-30: 2.5 mg via RESPIRATORY_TRACT

## 2022-12-05 ENCOUNTER — Encounter (HOSPITAL_BASED_OUTPATIENT_CLINIC_OR_DEPARTMENT_OTHER): Payer: Medicare Other

## 2022-12-08 ENCOUNTER — Telehealth: Payer: Self-pay | Admitting: Pulmonary Disease

## 2022-12-08 NOTE — Telephone Encounter (Signed)
Order has been sent to [facility] °

## 2022-12-08 NOTE — Telephone Encounter (Signed)
Samuel Becker father is checking on results of PFT and labwork. Samuel Becker phone number is [phone].

## 2022-12-08 NOTE — Telephone Encounter (Signed)
Briefly went over results of PFTs and ABG with patient and father. No immediate indication for NIV however with his severe restrictive defect and mildly reduced DLCO he may still qualify in the future. Will need to look into parameters.  Father accidentally postponed/cancelled ONO when other tests were scheduled. Please contact DME to re-schedule with patient. Active order is in

## 2022-12-11 NOTE — Telephone Encounter (Signed)
Samuel Becker, [name]; [name], [name]; [name] Received, Thanks!

## 2022-12-14 ENCOUNTER — Ambulatory Visit (INDEPENDENT_AMBULATORY_CARE_PROVIDER_SITE_OTHER): Payer: Medicare Other | Admitting: Pulmonary Disease

## 2022-12-14 ENCOUNTER — Telehealth: Payer: Self-pay | Admitting: Physician Assistant

## 2022-12-14 ENCOUNTER — Encounter (HOSPITAL_BASED_OUTPATIENT_CLINIC_OR_DEPARTMENT_OTHER): Payer: Self-pay | Admitting: Pulmonary Disease

## 2022-12-14 VITALS — BP 82/60 | HR 79 | Temp 97.8°F | Ht 64.0 in | Wt 111.0 lb

## 2022-12-14 DIAGNOSIS — J984 Other disorders of lung: Secondary | ICD-10-CM

## 2022-12-14 DIAGNOSIS — J9611 Chronic respiratory failure with hypoxia: Secondary | ICD-10-CM | POA: Diagnosis not present

## 2022-12-14 NOTE — Progress Notes (Addendum)
Subjective:   PATIENT ID: Samuel Becker GENDER: male DOB: 05/19/1991, MRN: [id number]  Chief Complaint  Patient presents with   Follow-up    Follow up. Patient stated everything is going okay.     Reason for Visit: Follow-up  Samuel Becker is a 32 year old wheelchair bound male never smoker with spina bifida, functional paraplegia, neurogenic bladder, GERD who presents for follow-up. As baseline he is independent and able to self-transfer. Routinely volunteers. Presents with parents who provide additional pertinent history.  Initial consult He was recently hospitalized from 4/3-4/9 and readmitted 10/02/22-10/07/22 for pneumonia (discharge summaries reviewed) and heart failure requiring diuresis and antibiotics. Discharged on 2L. He currently only wears oxygen at night. During the daytime SpO2 on his wrist monitor is >90%. Denies shortness of breath, cough and wheezing. Back to baseline activity including upper body exercises and independent of ADLs.  12/14/22 Since our last visit he has been compliant with his nightly O2. He will pick up his ONO today. Denies shortness of breath, wheezing or cough.  Symptoms worsen whenever he is ill.  He is back volunteering at [employer] and exercising. Blood pressure was low today. Recently changed BP medications with cardiology.  Social History: Rarely smokes cigars  Past Medical History:  Diagnosis Date   Spina bifida (HCC)      Family History  Problem Relation Age of Onset   Heart attack Maternal Grandfather    Heart attack Paternal Grandfather      Social History   Occupational History   Occupation: Acupuncturist at [employer]  Tobacco Use   Smoking status: Never   Smokeless tobacco: Never  Vaping Use   Vaping Use: Never used  Substance and Sexual Activity   Alcohol use: Not Currently    Comment: occ   Drug use: Never   Sexual activity: Not on file    Allergies  Allergen Reactions   Morphine And Codeine Other (See  Comments)    Hallucinations    Septra [Sulfamethoxazole-Trimethoprim] Other (See Comments)    Gi upset    Sulfamethoxazole Other (See Comments)    Gi upset    Trimethoprim Other (See Comments)    Unknown    Latex Swelling and Rash     Outpatient Medications Prior to Visit  Medication Sig Dispense Refill   acetaminophen (TYLENOL) 325 MG tablet Take 325 mg by mouth as needed for moderate pain.     albuterol (PROVENTIL) (2.5 MG/3ML) 0.083% nebulizer solution Take 3 mLs (2.5 mg total) by nebulization every 6 (six) hours as needed for wheezing or shortness of breath. 75 mL 1   cetirizine (ZYRTEC) 10 MG tablet Take 10 mg by mouth daily.     furosemide (LASIX) 40 MG tablet Take 1 tablet (40 mg total) by mouth daily. 90 tablet 4   guaiFENesin (ROBITUSSIN) 100 MG/5ML liquid Take 5 mLs by mouth every 4 (four) hours as needed for cough or to loosen phlegm. 120 mL 0   loperamide (IMODIUM) 2 MG capsule Take 1 capsule (2 mg total) by mouth 4 (four) times daily as needed for diarrhea or loose stools. 30 capsule 0   metoprolol succinate (TOPROL XL) 25 MG 24 hr tablet Take 1 tablet (25 mg total) by mouth daily. 90 tablet 2   Multiple Vitamins-Minerals (MULTIVITAMIN WITH MINERALS) tablet Take 1 tablet by mouth daily.     oxybutynin (DITROPAN XL) 15 MG 24 hr tablet Take 15 mg by mouth at bedtime.     pantoprazole (  PROTONIX) 40 MG tablet Take 1 tablet (40 mg total) by mouth 2 (two) times daily. 60 tablet 0   Potassium Chloride ER 20 MEQ TBCR Take 1 tablet (20 mEq total) by mouth daily. 90 tablet 3   spironolactone (ALDACTONE) 25 MG tablet Take 0.5 tablets (12.5 mg total) by mouth daily. 180 tablet 3   VITAMIN D PO Take 1 capsule by mouth daily.     No facility-administered medications prior to visit.    Review of Systems  Constitutional:  Negative for chills, diaphoresis, fever, malaise/fatigue and weight loss.  HENT:  Negative for congestion.   Respiratory:  Negative for cough, hemoptysis, sputum  production, shortness of breath and wheezing.   Cardiovascular:  Negative for chest pain, palpitations and leg swelling.     Objective:   Vitals:   12/14/22 0913  BP: (!) 82/60  Pulse: 79  Temp: 97.8 F (36.6 C)  TempSrc: Oral  SpO2: 98%  Weight: 111 lb (50.3 kg)  Height: 5\' 4"  (1.626 m)   SpO2: 98 % O2 Device: None (Room air)  Physical Exam: General: Well-appearing, thin appearing, no acute distress, paraplegic, wheelchairbound HENT: [place], AT Eyes: EOMI, no scleral icterus Respiratory: No respiratory distress Extremities:-Edema,-tenderness Neuro: AAO x4, CNII-XII grossly intact Psych: Normal mood, normal affect  Data Reviewed:  Imaging: CXR 10/07/22 - Diffuse bilateral infiltrates CXR 11/03/22 Improved/nearly resolved infiltrates  PFT: 12/14/22 FVC 1.14 (30%) FEV1 1.12 (35%) Ratio 95 TLC 50% DLCO 71% Interpretation: Very severe restrictive defect with mildly reduced DLCO.  No significant bronchodilator response   Labs: ABG    Component Value Date/Time   PHART 7.46 (H) 11/30/2022 1108   PCO2ART 45 11/30/2022 1108   PO2ART 79 (L) 11/30/2022 1108   HCO3 32.0 (H) 11/30/2022 1108   TCO2 30 09/20/2022 2031   ACIDBASEDEF 1.0 09/20/2022 2031   O2SAT 97.8 11/30/2022 1108   VBG 10/02/22 10/02/22 - 7.3/65/35 - chronic respiratory failure      Assessment & Plan:   Discussion: 32 year old wheelchair bound male never smoker with spina bifida, functional paraplegia, neurogenic bladder, GERD who presents for follow-up. Acute issues resolved.  Overnight oximetry pending. Would benefit nocturnal vent in setting of neuromuscular weakness with FVC <50%.  Per Chest guidelines, patients would benefit from NIV with any fall in FVC to <80% of predicted with symptoms or FVC to <50% of predicted without symptoms or SNIP/MIP to <-40 cmg H20 or hypercapnia would support the initiation of NIV or further testing.   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  Previous ABG's have documented high PCO2 during acute illness.  Patient would benefit from non-invasive ventilation.  Without this therapy, the patient is at high risk of ending up with worsening symptoms, worsened respiratory failure, need for ER visits and/or recurrent hospitalizations.  Bilevel device unable to adequately support patient's nocturnal ventilation needs.  Patient would benefit from NIV therapy with set tidal volumes and pressure.   Health Maintenance There is no immunization history for the selected administration types on file for this patient. CT Lung Screen - not qualified  No orders of the defined types were placed in this encounter. No orders of the defined types were placed in this encounter.   Return in about 3 months (around 03/16/2023).  I have spent  a total time of 36-minutes on the day of the appointment including chart review, data review, collecting history, coordinating care and discussing medical diagnosis and plan with the patient/family. Past medical history, allergies, medications were reviewed. Pertinent imaging, labs and tests included in this note have been reviewed and interpreted independently by me.  Killian Schwer Mechele Collin, MD [facility] 12/14/2022 8:16 PM  Office Number [phone]

## 2022-12-14 NOTE — Patient Instructions (Addendum)
Chronic hypoxemic respiratory failure --Follow-up results ONO (overnight oximetry) on room air --ORDER non-invasive ventilation nightly

## 2022-12-14 NOTE — Telephone Encounter (Signed)
Pt c/o BP issue: STAT if pt c/o blurred vision, one-sided weakness or slurred speech  1. What are your last 5 BP readings? 82/60 this morning  2. Are you having any other symptoms (ex. Dizziness, headache, blurred vision, passed out)? No   3. What is your BP issue? Pt's dad is concerned since the change in medication ( he didn't know which one) since his last office visit and is requesting a callback to discuss further.

## 2022-12-14 NOTE — Telephone Encounter (Signed)
Spoke with pt father ok per DPR. Reports pt metoprolol was recently increased to 25 mg PO every day.  At pulmonology appointment today BP 82/60-79 recheck 102/80.  Reports pt feels ok denies fatigue.  Advised father to have pt increase PO intake and eat a small salty snack.  Also advised to monitor BP at least daily.  Will route to provider and covering to f/u.

## 2022-12-15 ENCOUNTER — Other Ambulatory Visit (HOSPITAL_BASED_OUTPATIENT_CLINIC_OR_DEPARTMENT_OTHER): Payer: Self-pay

## 2022-12-15 DIAGNOSIS — J9611 Chronic respiratory failure with hypoxia: Secondary | ICD-10-CM

## 2022-12-15 NOTE — Telephone Encounter (Signed)
Please check in with the patient to see how his BP is doing. Thanks Tereso Newcomer, PA-C    12/15/2022 12:58 PM

## 2022-12-15 NOTE — Telephone Encounter (Signed)
Called and spoke with patient's father Judie Grieve.   He states they have not yet starting checking patient's BP daily, but will. He states patient is feeling fine, no fatigue or dizziness reported.   Instructed Bryan to check patient's BP with home cuff daily and keep a log. Take BP around the same time each day, at least 2 hours after taking medication. Instructed Bryan to report BP readings in 1 week or if patient begins feeling fatigued/dizzy.  Judie Grieve verbalized understanding and expressed appreciation for follow-up.

## 2022-12-18 ENCOUNTER — Telehealth (HOSPITAL_BASED_OUTPATIENT_CLINIC_OR_DEPARTMENT_OTHER): Payer: Self-pay | Admitting: Pulmonary Disease

## 2022-12-18 NOTE — Telephone Encounter (Signed)
Melissa @ [employer] needs changes made to the office notes in order for this PT to be rx'd a non-invasive ventilator.  Too much to give to me so please call Melissa  Marking as High Priority.  ASAP [phone]

## 2022-12-19 NOTE — Telephone Encounter (Signed)
I contacted Melissa at [employer]. No answer. Left voicemail requesting call back at office or personal cell.

## 2022-12-19 NOTE — Telephone Encounter (Signed)
Communicated with Juliette Alcide. Documentation updated for clarification. Patient will receive NIV in setting of neuromuscular weakness. Closing encounter.

## 2022-12-22 ENCOUNTER — Telehealth: Payer: Self-pay | Admitting: Pulmonary Disease

## 2022-12-22 NOTE — Telephone Encounter (Signed)
CMN is what they are calling about states that it was signed and faxed back I called Melissa she is going to refax it to me and I will get a NP to sign it

## 2022-12-22 NOTE — Telephone Encounter (Signed)
Melissa from [employer] calling if you look at previous encounter the order was done by the Dr. But they are calling because they never received the Fax for the order to them and there was no note that it did get faxed over

## 2022-12-26 ENCOUNTER — Ambulatory Visit (HOSPITAL_COMMUNITY): Payer: Medicare Other | Attending: Cardiology

## 2022-12-26 DIAGNOSIS — I5020 Unspecified systolic (congestive) heart failure: Secondary | ICD-10-CM | POA: Diagnosis not present

## 2022-12-26 LAB — ECHOCARDIOGRAM LIMITED
Area-P 1/2: 4.36 cm2
P 1/2 time: 664 msec
S' Lateral: 2.45 cm

## 2022-12-28 ENCOUNTER — Encounter: Payer: Self-pay | Admitting: Physician Assistant

## 2022-12-28 ENCOUNTER — Telehealth (HOSPITAL_BASED_OUTPATIENT_CLINIC_OR_DEPARTMENT_OTHER): Payer: Self-pay | Admitting: Pulmonary Disease

## 2022-12-28 DIAGNOSIS — J9611 Chronic respiratory failure with hypoxia: Secondary | ICD-10-CM

## 2022-12-28 NOTE — Telephone Encounter (Signed)
Patients father states has not heard from our office reagarding -Follow-up results ONO (overnight oximetry) on room air. Please advise and call patients father back with results.

## 2022-12-28 NOTE — Telephone Encounter (Signed)
DME has been contacted. Awaiting fax

## 2022-12-29 ENCOUNTER — Telehealth: Payer: Self-pay | Admitting: *Deleted

## 2022-12-29 DIAGNOSIS — I5020 Unspecified systolic (congestive) heart failure: Secondary | ICD-10-CM

## 2022-12-29 NOTE — Addendum Note (Signed)
Addended byClyda Greener M on: 12/29/2022 05:13 PM   Modules accepted: Orders

## 2022-12-29 NOTE — Telephone Encounter (Signed)
Reviewed ONO  Overnight Oximetry Results Date: 12/14/22 SpO2 <88% 1 hour 6 min 22 sec.  [place] SpO2 63%  Qualified for oxygen  Assessment/Plan Nocturnal Hypoxemia --CONTINUE 2L O2 via [place] nightly  --Contacted patient/patient's father regarding test results. Aware and appreciative of call.  Does not need oxygen tanks, only needs compressor at night. Will send staff message to DISCONTINUE oxygen tanks and KEEP compressor for nocturnal hypoxemia

## 2022-12-29 NOTE — Telephone Encounter (Signed)
-----   Message from Tereso Newcomer sent at 12/29/2022 10:34 AM EDT ----- Discussed with Dr.Turner. Since EF remains < 35%, he may be a candidate for ICD. Refer to EP for consideration of ICD. Tereso Newcomer, PA-C    12/29/2022 10:34 AM

## 2022-12-29 NOTE — Telephone Encounter (Addendum)
Sent AMB DME order in to D/C oxygen through [employer].   Does not need oxygen tanks, only needs compressor at night.

## 2023-01-01 ENCOUNTER — Telehealth: Payer: Self-pay | Admitting: Pulmonary Disease

## 2023-01-01 NOTE — Telephone Encounter (Signed)
DME order was erroneously sent to [facility] Please resend the following order to [employer]:   DISCONTINUE oxygen tanks and KEEP compressor for nocturnal hypoxemia.

## 2023-01-01 NOTE — Telephone Encounter (Signed)
Judie Grieve father states patient's oxygen needs to be picked up by [employer].Judie Grieve phone number is [phone].

## 2023-01-02 NOTE — Telephone Encounter (Signed)
I have now faxed the order to [facility]

## 2023-01-04 NOTE — Telephone Encounter (Signed)
I called [employer] to verify if they got the order that I faxed on 01/02/23. They stated the last thing they had received was from April 2024. I have now refaxed the order again today

## 2023-01-05 NOTE — Telephone Encounter (Signed)
I spoke with Samuel Becker with [employer] and she confirmed that she has this order

## 2023-01-10 ENCOUNTER — Encounter: Payer: Self-pay | Admitting: Pulmonary Disease

## 2023-01-10 DIAGNOSIS — R490 Dysphonia: Secondary | ICD-10-CM | POA: Insufficient documentation

## 2023-02-12 NOTE — Progress Notes (Unsigned)
Electrophysiology Office Note:    Date:  02/13/2023   ID:  TRAIVON Becker, DOB 02/28/1991, MRN [id number]  PCP:  Moshe Cipro, NP   [facility] Providers Cardiologist:  Armanda Magic, MD     Referring MD: Beatrice Lecher, PA-C   History of Present Illness:    Samuel Becker is a 32 y.o. 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     He was on April 3 with hypoxic respiratory failure and echo acute right lower lobe pneumonia.  He return to the hospital with recurrent hypoxic tori failure now attributed to acute systolic CHF.  Echocardiogram showed an ejection fraction of 35%.  CT did not show any evidence of coronary disease.  GDMT has been limited by hypotension.  He has been on the max tolerated doses of CHF medications since diagnosis.  Repeat echocardiogram in July showed decrease in ejection fraction to 30 to 35%.     Today, he reports that he is doing well.  Ankle swelling appears to be controlled.  The only new symptom he reports is a sensation of irregular heartbeats, particularly at nighttime.  These are somewhat irregular and forceful but not fast.   EKGs/Labs/Other Studies Reviewed Today:    Echocardiogram:  TTE 12/26/2022 EF 30-35%, Gr I diastolic dysfunction  TTE 10/03/2022 EF 35-40%. Bicuspid aortic valve   Monitors:   Stress testing:   Advanced imaging:  Coronary CT 09/2022 Left dominant; calcium score is zero  Cardiac catherization   EKG:   EKG Interpretation Date/Time:  Tuesday February 13 2023 08:52:05 EDT Ventricular Rate:  77 PR Interval:  128 QRS Duration:  76 QT Interval:  364 QTC Calculation: 411 R Axis:   132  Text Interpretation: Normal sinus rhythm Left posterior fascicular block When compared with ECG of 13-Feb-2023 08:51, No significant change was found Confirmed by York Pellant [phone]) on 02/13/2023 9:13:31 AM      Physical Exam:    VS:  BP 110/76 (BP Location: Right Arm, Patient Position: Sitting, Cuff Size: Normal)   Pulse 77   Ht 5\' 4"  (1.626 m)   Wt 114 lb 8 oz (51.9 kg)   SpO2 96%   BMI 19.65 kg/m     Wt Readings from Last 3 Encounters:  02/13/23 114 lb 8 oz (51.9 kg)  12/14/22 111 lb (50.3 kg)  11/20/22 111 lb (50.3 kg)     GEN: Well nourished, in no acute distress.  CARDIAC: RRR, no murmurs, rubs, gallops RESPIRATORY:  Normal work of breathing MUSCULOSKELETAL: + edema; leg braces in place;     ASSESSMENT & PLAN:    Chronic congestive heart failure with reduced ejection fraction Due to nonischemic cardiomyopathy Diagnosed shortly after admission for acute pneumonia; GDMT has been difficult to titrate due to hypotension but is now progressing EF appears to be unchanged or slightly worse since diagnosis Repeat TTE was performed less than 90 days after titration of GDMT but EF has decreased.   Palpitations Will place 7-day monitor  Spina bifida Has neurogenic bladder, occasionally caths; non-functioning shunt Has had a history of MRSA infection of hardware Was told he should avoid having an MRI We will enquire with his neurosurgery team as to whether he can get an MRI  Bicuspid aortic valve Without stenosis   I think the repeat TTE may have been a little premature to move forward with ICD at this point. The original  TTE was performed while he was acutely ill, and he was not able to tolerate GDMT until he had recovered somewhat, and he still is not yet on ACEi-ARB-ARB/ARNI. He is also at increased risk of infection with a history of MRSA infection and neurogenic bladder. Nonischemic cardiomyopathy typically has a lower risk of malignant arrhythmia, so I think it worthwhile to delay a little and stick to strict criteria for ICD placement.    Over an hour spent on this visit.  Signed, Maurice Small, MD  02/13/2023 9:42 AM    [facility]

## 2023-02-13 ENCOUNTER — Ambulatory Visit: Payer: Medicare Other | Attending: Cardiovascular Disease | Admitting: Cardiovascular Disease

## 2023-02-13 ENCOUNTER — Ambulatory Visit (INDEPENDENT_AMBULATORY_CARE_PROVIDER_SITE_OTHER): Payer: Medicare Other

## 2023-02-13 ENCOUNTER — Encounter: Payer: Self-pay | Admitting: Cardiovascular Disease

## 2023-02-13 VITALS — BP 110/76 | HR 77 | Ht 64.0 in | Wt 114.5 lb

## 2023-02-13 DIAGNOSIS — I5020 Unspecified systolic (congestive) heart failure: Secondary | ICD-10-CM

## 2023-02-13 DIAGNOSIS — Q231 Congenital insufficiency of aortic valve: Secondary | ICD-10-CM | POA: Diagnosis not present

## 2023-02-13 DIAGNOSIS — I77810 Thoracic aortic ectasia: Secondary | ICD-10-CM | POA: Diagnosis not present

## 2023-02-13 DIAGNOSIS — R002 Palpitations: Secondary | ICD-10-CM

## 2023-02-13 NOTE — Patient Instructions (Signed)
Medication Instructions:  Your physician recommends that you continue on your current medications as directed. Please refer to the Current Medication list given to you today. *If you need a refill on your cardiac medications before your next appointment, please call your pharmacy*   Testing/Procedures: Zio Cardiac Monitor Your physician has recommended that you wear an event monitor. Event monitors are medical devices that record the heart's electrical activity. Doctors most often [place] these monitors to diagnose arrhythmias. Arrhythmias are problems with the speed or rhythm of the heartbeat. The monitor is a small, portable device. You can wear one while you do your normal daily activities. This is usually used to diagnose what is causing palpitations/syncope (passing out).    Follow-Up: At [facility], you and your health needs are our priority.  As part of our continuing mission to provide you with exceptional heart care, we have created designated Provider Care Teams.  These Care Teams include your primary Cardiologist (physician) and Advanced Practice Providers (APPs -  Physician Assistants and Nurse Practitioners) who all work together to provide you with the care you need, when you need it.  We recommend signing up for the patient portal called "MyChart".  Sign up information is provided on this After Visit Summary.  MyChart is used to connect with patients for Virtual Visits (Telemedicine).  Patients are able to view lab/test results, encounter notes, upcoming appointments, etc.  Non-urgent messages can be sent to your provider as well.   To learn more about what you can do with MyChart, go to [email or web address].    Your next appointment:   After Cardiac monitor is completed 3-4 weeks  Provider:   York Pellant, MD    Christena Deem- Long Term Monitor Instructions  Your physician has requested you wear a ZIO patch monitor for 7 days.  This is a single patch monitor. [employer]  supplies one patch monitor per enrollment. Additional stickers are not available. Please do not apply patch if you will be having a Nuclear Stress Test,  Echocardiogram, Cardiac CT, MRI, or Chest Xray during the period you would be wearing the  monitor. The patch cannot be worn during these tests. You cannot remove and re-apply the  ZIO XT patch monitor.  Your ZIO patch monitor will be mailed 3 day USPS to your address on file. It may take 3-5 days  to receive your monitor after you have been enrolled.  Once you have received your monitor, please review the enclosed instructions. Your monitor  has already been registered assigning a specific monitor serial # to you.  Billing and Patient Assistance Program Information  We have supplied [employer] with any of your insurance information on file for billing purposes. [employer] offers a sliding scale Patient Assistance Program for patients that do not have  insurance, or whose insurance does not completely cover the cost of the ZIO monitor.  You must apply for the Patient Assistance Program to qualify for this discounted rate.  To apply, please call [employer] at [phone], select option 4, select option 2, ask to apply for  Patient Assistance Program. Meredeth Ide will ask your household income, and how many people  are in your household. They will quote your out-of-pocket cost based on that information.  [employer] will also be able to set up a 33-month, interest-free payment plan if needed.  Applying the monitor   Shave hair from upper left chest.  Hold abrader disc by orange tab. Rub abrader in 40 strokes over the upper left chest  as  indicated in your monitor instructions.  Clean area with 4 enclosed alcohol pads. Let dry.  Apply patch as indicated in monitor instructions. Patch will be placed under collarbone on left  side of chest with arrow pointing upward.  Rub patch adhesive wings for 2 minutes. Remove white label marked "1". Remove the white   label marked "2". Rub patch adhesive wings for 2 additional minutes.  While looking in a mirror, press and release button in center of patch. A small green light will  flash 3-4 times. This will be your only indicator that the monitor has been turned on.  Do not shower for the first 24 hours. You may shower after the first 24 hours.  Press the button if you feel a symptom. You will hear a small click. Record Date, Time and  Symptom in the Patient Logbook.  When you are ready to remove the patch, follow instructions on the last 2 pages of Patient  Logbook. Stick patch monitor onto the last page of Patient Logbook.  Place Patient Logbook in the blue and white box. Use locking tab on box and tape box closed  securely. The blue and white box has prepaid postage on it. Please place it in the mailbox as  soon as possible. Your physician should have your test results approximately 7 days after the  monitor has been mailed back to [facility].  Call [facility] Customer Care at [phone] if you have questions regarding  your ZIO XT patch monitor. Call them immediately if you see an orange light blinking on your  monitor.  If your monitor falls off in less than 4 days, contact our Monitor department at [phone].  If your monitor becomes loose or falls off after 4 days call [employer] at [phone] for  suggestions on securing your monitor

## 2023-02-13 NOTE — Progress Notes (Unsigned)
Enrolled for [employer] to mail a ZIO XT long term holter monitor to the patients address on file.  

## 2023-02-14 ENCOUNTER — Other Ambulatory Visit: Payer: Self-pay

## 2023-02-14 ENCOUNTER — Telehealth: Payer: Self-pay | Admitting: Cardiovascular Disease

## 2023-02-14 ENCOUNTER — Telehealth: Payer: Self-pay | Admitting: *Deleted

## 2023-02-14 DIAGNOSIS — Z79899 Other long term (current) drug therapy: Secondary | ICD-10-CM

## 2023-02-14 MED ORDER — LOSARTAN POTASSIUM 25 MG PO TABS: 12.5000 mg | ORAL_TABLET | Freq: Every day | ORAL | 3 refills | Status: DC

## 2023-02-14 NOTE — Telephone Encounter (Signed)
Pt's dad,  Judie Grieve, [place] on file called back.  He has been made aware that Dr. Nelly Laurence wants to start Losartan 25 mg taking 1/2 tablet daily and to come back in for 3 week lab appt.  Dad agreed, lab scheduled and prescription sent to [facility].

## 2023-02-14 NOTE — Telephone Encounter (Signed)
-----   Message from Tereso Newcomer sent at 02/13/2023  9:40 PM EDT ----- We can certainly try. I believe he was on Entresto in the hospital and could not tol it due to low BP. He is still on Lasix. I think his BP would bottom out with Entresto. We could try Losartan 12.5 once daily. I see you have a follow up with him in Oct. Do you want to wait until then to decide on +/- repeat TTE?  Victorino Dike - Can we get him started on Losartan 12.5 mg once daily and get a repeat BMET in 3 weeks?  Scott ----- Message ----- From: Maurice Small, MD Sent: 02/13/2023   6:25 PM EDT To: Beatrice Lecher, PA-C  I saw this patient in clinic today. His BP was a bit better today 110/70's -- do you think he would tolerate ARB or entresto? There are reasons I would want to avoid an ICD in him, so I'd like to give him every chance to improve. I'm hoping that we can say that his recent TTE was inside of 30 days of max GDMT.

## 2023-02-14 NOTE — Telephone Encounter (Signed)
Call placed to pt regarding message below.  Left pt a message to call back and ask for me.

## 2023-02-14 NOTE — Telephone Encounter (Signed)
Fleet Contras - [facility] calling, they would like to speak with Dr. Nelly Laurence regarding his device and also a potential MRI for the pt

## 2023-02-14 NOTE — Telephone Encounter (Signed)
Spoke with Fleet Contras from Ortho she stated that patient has hardware that is stainless steel, MRI is ok but will be a 1.5T. Hardware may cause "distraction" in imaging, and patient may experience some discomfort around the area of the hardware.

## 2023-02-16 DIAGNOSIS — Q231 Congenital insufficiency of aortic valve: Secondary | ICD-10-CM | POA: Diagnosis not present

## 2023-02-16 DIAGNOSIS — R002 Palpitations: Secondary | ICD-10-CM

## 2023-02-16 DIAGNOSIS — I5020 Unspecified systolic (congestive) heart failure: Secondary | ICD-10-CM

## 2023-02-20 ENCOUNTER — Telehealth: Payer: Self-pay | Admitting: *Deleted

## 2023-02-20 DIAGNOSIS — I5020 Unspecified systolic (congestive) heart failure: Secondary | ICD-10-CM

## 2023-02-20 NOTE — Telephone Encounter (Signed)
-----   Message from Tereso Newcomer sent at 02/16/2023  1:08 PM EDT ----- Please arrange limited echocardiogram in 2 mos to recheck EF. Thanks, Acupuncturist ----- Message ----- From: Maurice Small, MD Sent: 02/15/2023   5:21 PM EDT To: Beatrice Lecher, PA-C  I think this is a good plan. I'm ok with whatever the conclusion is with a normal TTE! MRI is nice for a definitive EF, but the presence of LGE is also great for assessing SCD risk. ----- Message ----- From: Beatrice Lecher, PA-C Sent: 02/15/2023  11:52 AM EDT To: Maurice Small, MD  If it will be s/w painful for him, maybe get the repeat TTE in 2 mos first. If still low then get an MR? Do we still do MUGAs? LOL! If they can get the 3D EF on the TTE it should be pretty accurate. ----- Message ----- From: Maurice Small, MD Sent: 02/14/2023   4:44 PM EDT To: Beatrice Lecher, PA-C; Elliot Cousin, RMA  I don't know the right answer here.  He's an atypical patient. I just paused a little because his repeat TTE was done in fewer than 90 days since his diagnosis of cardiomyopathy, and certainly less than 90 days since his GDMT was titrated up. He's had MRSA infection before, and may have some additional risk of infection with the neurogenic bladder, so I have a little more reservations than usual to put a device in him.  Additionally, I'm not sure what's caused his cardiomyopathy. Do you think an MRI would be useful? I contacted his spine surgeons, and they stated that he could get a 1.5T MRI though it might cause him some discomfort. It might just be that we should repeat the TTE in 2 months. According to [facility], NICM should have pretty low risk of SCD. ----- Message ----- From: Kennon Rounds Sent: 02/13/2023   9:50 PM EDT To: Elliot Cousin, RMA; Maurice Small, MD  We can certainly try. I believe he was on Entresto in the hospital and could not tol it due to low BP. He is still on Lasix. I think his BP would bottom out with  Entresto. We could try Losartan 12.5 once daily. I see you have a follow up with him in Oct. Do you want to wait until then to decide on +/- repeat TTE?  Victorino Dike - Can we get him started on Losartan 12.5 mg once daily and get a repeat BMET in 3 weeks?  Scott ----- Message ----- From: Maurice Small, MD Sent: 02/13/2023   6:25 PM EDT To: Beatrice Lecher, PA-C  I saw this patient in clinic today. His BP was a bit better today 110/70's -- do you think he would tolerate ARB or entresto? There are reasons I would want to avoid an ICD in him, so I'd like to give him every chance to improve. I'm hoping that we can say that his recent TTE was inside of 30 days of max GDMT.

## 2023-02-22 ENCOUNTER — Encounter (HOSPITAL_COMMUNITY): Payer: Self-pay

## 2023-03-07 ENCOUNTER — Ambulatory Visit: Payer: Medicare Other | Attending: Family Medicine

## 2023-03-08 ENCOUNTER — Ambulatory Visit (HOSPITAL_COMMUNITY): Payer: Medicare Other

## 2023-03-15 ENCOUNTER — Ambulatory Visit (HOSPITAL_BASED_OUTPATIENT_CLINIC_OR_DEPARTMENT_OTHER): Payer: Medicare Other | Admitting: Pulmonary Disease

## 2023-03-15 ENCOUNTER — Encounter (HOSPITAL_BASED_OUTPATIENT_CLINIC_OR_DEPARTMENT_OTHER): Payer: Self-pay | Admitting: Pulmonary Disease

## 2023-03-15 VITALS — BP 114/78 | HR 80 | Ht 64.0 in | Wt 114.0 lb

## 2023-03-15 DIAGNOSIS — Q068 Other specified congenital malformations of spinal cord: Secondary | ICD-10-CM | POA: Insufficient documentation

## 2023-03-15 DIAGNOSIS — J984 Other disorders of lung: Secondary | ICD-10-CM

## 2023-03-15 DIAGNOSIS — J9611 Chronic respiratory failure with hypoxia: Secondary | ICD-10-CM

## 2023-03-15 DIAGNOSIS — Z993 Dependence on wheelchair: Secondary | ICD-10-CM | POA: Insufficient documentation

## 2023-03-15 DIAGNOSIS — G950 Syringomyelia and syringobulbia: Secondary | ICD-10-CM | POA: Insufficient documentation

## 2023-03-15 DIAGNOSIS — H5000 Unspecified esotropia: Secondary | ICD-10-CM | POA: Insufficient documentation

## 2023-03-15 DIAGNOSIS — H5021 Vertical strabismus, right eye: Secondary | ICD-10-CM | POA: Insufficient documentation

## 2023-03-15 NOTE — Progress Notes (Signed)
Subjective:   PATIENT ID: Samuel Becker GENDER: male DOB: Jul 15, 1990, MRN: [id number]  Chief Complaint  Patient presents with   Restrictive lung disease    Reason for Visit: Follow-up  Samuel Becker is a 32 year old wheelchair bound male never smoker with spina bifida, functional paraplegia, neurogenic bladder, GERD who presents for follow-up. As baseline he is independent and able to self-transfer. Routinely volunteers. Presents with parents who provide additional pertinent history.  Initial consult He was recently hospitalized from 4/3-4/9 and readmitted 10/02/22-10/07/22 for pneumonia (discharge summaries reviewed) and heart failure requiring diuresis and antibiotics. Discharged on 2L. He currently only wears oxygen at night. During the daytime SpO2 on his wrist monitor is >90%. Denies shortness of breath, cough and wheezing. Back to baseline activity including upper body exercises and independent of ADLs.  12/14/22 Since our last visit he has been compliant with his nightly O2. He will pick up his ONO today. Denies shortness of breath, wheezing or cough.  Symptoms worsen whenever he is ill.  He is back volunteering at [employer] and exercising. Blood pressure was low today. Recently changed BP medications with cardiology.  03/15/23 Since our last visit he wears oxygen nightly. Has BiPAP and uses it when his reflux is not affecting him. On protonix and continues to have problems at times. Denies shortness of breath, wheezing or cough. Back to his baseline activities of volunteering and working out daily. Mother presents and provides support and information.  Social History: Rarely smokes cigars  Past Medical History:  Diagnosis Date   HFrEF (heart failure with reduced ejection fraction) (HCC) 10/02/2022   Limited TTE 12/26/2022: EF 30-35, GR 1 DD, normal RVSF, bicuspid AV, mild AI, aortic root 39 mm, RAP 3     Spina bifida (HCC)      Family History  Problem Relation  Age of Onset   Heart attack Maternal Grandfather    Heart attack Paternal Grandfather      Social History   Occupational History   Occupation: Acupuncturist at [employer]  Tobacco Use   Smoking status: Never   Smokeless tobacco: Never  Vaping Use   Vaping status: Never Used  Substance and Sexual Activity   Alcohol use: Not Currently    Comment: occ   Drug use: Never   Sexual activity: Not on file    Allergies  Allergen Reactions   Morphine And Codeine Other (See Comments)    Hallucinations    Septra [Sulfamethoxazole-Trimethoprim] Other (See Comments)    Gi upset    Sulfamethoxazole Other (See Comments)    Gi upset    Trimethoprim Other (See Comments)    Unknown    Latex Swelling and Rash     Outpatient Medications Prior to Visit  Medication Sig Dispense Refill   acetaminophen (TYLENOL) 325 MG tablet Take 325 mg by mouth as needed for moderate pain.     albuterol (PROVENTIL) (2.5 MG/3ML) 0.083% nebulizer solution Take 3 mLs (2.5 mg total) by nebulization every 6 (six) hours as needed for wheezing or shortness of breath. 75 mL 1   cetirizine (ZYRTEC) 10 MG tablet Take 10 mg by mouth daily.     furosemide (LASIX) 40 MG tablet Take 1 tablet (40 mg total) by mouth daily. 90 tablet 4   guaiFENesin (ROBITUSSIN) 100 MG/5ML liquid Take 5 mLs by mouth every 4 (four) hours as needed for cough or to loosen phlegm. 120 mL 0   loperamide (IMODIUM) 2 MG capsule Take 1  capsule (2 mg total) by mouth 4 (four) times daily as needed for diarrhea or loose stools. 30 capsule 0   losartan (COZAAR) 25 MG tablet Take 0.5 tablets (12.5 mg total) by mouth daily. 45 tablet 3   metoprolol succinate (TOPROL XL) 25 MG 24 hr tablet Take 1 tablet (25 mg total) by mouth daily. 90 tablet 2   Multiple Vitamins-Minerals (MULTIVITAMIN WITH MINERALS) tablet Take 1 tablet by mouth daily.     oxybutynin (DITROPAN XL) 15 MG 24 hr tablet Take 15 mg by mouth at bedtime.     pantoprazole (PROTONIX) 40 MG tablet Take 1  tablet (40 mg total) by mouth 2 (two) times daily. 60 tablet 0   Potassium Chloride ER 20 MEQ TBCR Take 1 tablet (20 mEq total) by mouth daily. 90 tablet 3   VITAMIN D PO Take 1 capsule by mouth daily.     spironolactone (ALDACTONE) 25 MG tablet Take 0.5 tablets (12.5 mg total) by mouth daily. 180 tablet 3   No facility-administered medications prior to visit.    Review of Systems  Constitutional:  Negative for chills, diaphoresis, fever, malaise/fatigue and weight loss.  HENT:  Negative for congestion.   Respiratory:  Negative for cough, hemoptysis, sputum production, shortness of breath and wheezing.   Cardiovascular:  Negative for chest pain, palpitations and leg swelling.  Gastrointestinal:  Positive for heartburn.     Objective:   Vitals:   03/15/23 0937  BP: 114/78  Pulse: 80  SpO2: 94%  Weight: 114 lb (51.7 kg)  Height: 5\' 4"  (1.626 m)   SpO2: 94 %  Physical Exam: General: Well-appearing, no acute distress, paraplegic, wheelchairbound HENT: [place], AT Eyes: EOMI, no scleral icterus Respiratory: Clear to auscultation bilaterally.  No crackles, wheezing or rales Cardiovascular: RRR, -M/R/G, no JVD Extremities:-Edema,-tenderness Neuro: AAO x4, CNII-XII grossly intact Psych: Normal mood, normal affect  Data Reviewed:  Imaging: CXR 10/07/22 - Diffuse bilateral infiltrates CXR 11/03/22 Improved/nearly resolved infiltrates  PFT: 12/14/22 FVC 1.14 (30%) FEV1 1.12 (35%) Ratio 95 TLC 50% DLCO 71% Interpretation: Very severe restrictive defect with mildly reduced DLCO.  No significant bronchodilator response   Labs: ABG    Component Value Date/Time   PHART 7.46 (H) 11/30/2022 1108   PCO2ART 45 11/30/2022 1108   PO2ART 79 (L) 11/30/2022 1108   HCO3 32.0 (H) 11/30/2022 1108   TCO2 30 09/20/2022 2031   ACIDBASEDEF 1.0 09/20/2022 2031   O2SAT 97.8 11/30/2022 1108   VBG 10/02/22 10/02/22 - 7.3/65/35 - chronic respiratory failure      Assessment & Plan:    Discussion: 32 year old wheelchair bound male never smoker with spina bifida, functional paraplegia, neurogenic bladder, GERD, chronic hypoxemic respiratory failure who presents for follow-up. Benefits from nocturnal vent in setting of neuromuscular weakness with FVC <50%.  Per Chest guidelines, patients would benefit from NIV with any fall in FVC to <80% of predicted with symptoms or FVC to <50% of predicted without symptoms or SNIP/MIP to <-40 cmg H20 or hypercapnia would support the initiation of NIV or further testing.   Chronic hypoxemic and hypercarbic respiratory failure Very severe restrictive defect secondary neuromuscular weakness (FVC 30%) --CONTINUE oxygen nightly as needed --CONTINUE non-invasive ventilation nightly. Will assess 90 day compliance at next visit. --Continue protonix and GERD food handout given  Patient's chronic respiratory failure due to diaphragmatic weakness in setting to paraplegia is life threatening.  Previous ABG's have documented high PCO2 during acute illness.  Patient would benefit from non-invasive ventilation.  Without this therapy, the patient is at high risk of ending up with worsening symptoms, worsened respiratory failure, need for ER visits and/or recurrent hospitalizations.  Bilevel device unable to adequately support patient's nocturnal ventilation needs.  Patient would benefit from NIV therapy with set tidal volumes and pressure.   Health Maintenance Immunization History  Administered Date(s) Administered   Fluad Trivalent(High Dose 65+) 03/21/2011   HPV Quadrivalent 10/25/2009, 03/24/2013, 05/21/2014   Hepatitis A, Adult 11/16/2009   Influenza Inj Mdck Quad With Preservative 04/08/2014   Influenza,Quad,Nasal, Live 03/24/2013   Influenza,inj,Quad PF,6+ Mos 03/19/2015, 07/26/2017   Influenza,trivalent, recombinat, inj, PF 03/22/2012   Meningococcal Conjugate 03/24/2013   Pneumococcal Polysaccharide-23 11/16/2009   Td 08/14/2003   Tdap  05/21/2014   CT Lung Screen - not qualified  No orders of the defined types were placed in this encounter. No orders of the defined types were placed in this encounter.   Return in about 6 months (around 09/12/2023).  I have spent a total time of 33-minutes on the day of the appointment including chart review, data review, collecting history, coordinating care and discussing medical diagnosis and plan with the patient/family. Past medical history, allergies, medications were reviewed. Pertinent imaging, labs and tests included in this note have been reviewed and interpreted independently by me.  Chaslyn Eisen Mechele Collin, MD [facility] 03/15/2023 10:03 AM  Office Number [phone]

## 2023-03-15 NOTE — Patient Instructions (Signed)
Chronic hypoxemic and hypercarbic respiratory failure Very severe restrictive defect secondary neuromuscular weakness (FVC 30%) --CONTINUE oxygen nightly as needed --CONTINUE non-invasive ventilation nightly. Will assess compliance at next visit. --Continue protonix and GERD food handout given

## 2023-03-30 ENCOUNTER — Telehealth: Payer: Self-pay | Admitting: Physician Assistant

## 2023-03-30 ENCOUNTER — Ambulatory Visit: Payer: Medicare Other | Admitting: Cardiovascular Disease

## 2023-03-30 NOTE — Telephone Encounter (Signed)
Pts family stopped and dropped off [employer] Weight chart. Will be in providers box by eod.

## 2023-04-18 ENCOUNTER — Ambulatory Visit (HOSPITAL_COMMUNITY): Payer: Medicare Other | Attending: Cardiology

## 2023-04-18 DIAGNOSIS — I5020 Unspecified systolic (congestive) heart failure: Secondary | ICD-10-CM | POA: Diagnosis present

## 2023-04-18 LAB — ECHOCARDIOGRAM LIMITED
Area-P 1/2: 3.85 cm2
S' Lateral: 2.6 cm

## 2023-04-19 ENCOUNTER — Telehealth: Payer: Self-pay

## 2023-04-19 DIAGNOSIS — I420 Dilated cardiomyopathy: Secondary | ICD-10-CM

## 2023-04-19 DIAGNOSIS — I77810 Thoracic aortic ectasia: Secondary | ICD-10-CM

## 2023-04-19 NOTE — Telephone Encounter (Signed)
-----   Message from Armanda Magic sent at 04/19/2023  9:17 AM EDT ----- Echo showed low normal pumping function of the heart and mildly enlarged aortic root   -compared to echo 12/26/22, LVF has improved - repeat echo limited in 1 year for dilated aorta

## 2023-04-19 NOTE — Telephone Encounter (Signed)
Spoke with patient's mother and father (DPR) and reviewed Echo results. Patient mother and father verbalize understanding of improved EF and of dilated aorta. Orders for repeat limited echo in 1 year placed.

## 2023-04-25 NOTE — Progress Notes (Unsigned)
  Electrophysiology Office Note:   Date:  04/26/2023  ID:  FRANZ SVEC, DOB Sep 06, 1990, MRN [id number]  Primary Cardiologist: Armanda Magic, MD Electrophysiologist: Maurice Small, MD      History of Present Illness:   Samuel Becker is a 32 y.o. 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   Monitor as below with rare ectopy and no arrhyhtmia. EF has normalized.   Since last being seen in our clinic the patient reports doing well from a cardiac perpsective. Still with rare palpitations. Working on getting his strength back up, is back to volunteering. Weight fluctuates. Had 2.7 weight gain since yesterday, but also ate at [employer] steak[employer] last night. Has some edema, R >L foot only. Otherwise, he denies chest pain, PND, orthopnea, nausea, vomiting, dizziness, syncope, weight gain, or early satiety.   Review of systems complete and found to be negative unless listed in HPI.   EP Information / Studies Reviewed:    EKG is not ordered today. EKG from 02/13/2023 reviewed which showed NSR at 77 bpm       7 day monitor, 02/2023 Predominant rhythm: sinus Sinus HR: 48 - 155, AVG 85 bpm <1 % atrial ectopy <1 % ventricular ectopy Arrhythmia detected: none  Echo 04/18/2023 LVEF 50-55%  Physical Exam:   VS:  BP 110/82   Pulse (!) 102   Ht 5\' 4"  (1.626 m)   Wt 118 lb 11.2 oz (53.8 kg)   SpO2 99%   BMI 20.37 kg/m    Wt Readings from Last 3 Encounters:  04/26/23 118 lb 11.2 oz (53.8 kg)  03/15/23 114 lb (51.7 kg)  02/13/23 114 lb 8 oz (51.9 kg)     GEN: Well nourished, well developed in no acute distress NECK: No JVD; No carotid bruits CARDIAC: Regular rate and rhythm, no murmurs, rubs, gallops RESPIRATORY:  Clear to auscultation without rales, wheezing or rhonchi  ABDOMEN: Soft, non-tender, non-distended EXTREMITIES:  No edema; No deformity   ASSESSMENT AND PLAN:     Chronic congestive heart failure with reduced ejection fraction Due to nonischemic cardiomyopathy EF has recovered on GDMT Not currently indicated for ICD Weigh up overnight Continue lasix 40 mg daily. Unclear what goal weight is as he has gained about 15 lbs since his illness in April.  If weight remains elevated above baseline tomorrow, OK to take extra 20 mg of lasix. Counseled on salt and fluid restriction, as well as sliding scale diuretics.    Palpitations Monitor negative.    Spina bifida Has neurogenic bladder, occasionally caths; non-functioning shunt Has had a history of MRSA infection of hardware Was told he should avoid having an MRI   Bicuspid aortic valve Without stenosis   With recovered EF, follow up with Electrophysiology as needed only.    Signed, Graciella Freer, PA-C

## 2023-04-26 ENCOUNTER — Ambulatory Visit: Payer: Medicare Other | Attending: Cardiovascular Disease | Admitting: Student

## 2023-04-26 ENCOUNTER — Encounter: Payer: Self-pay | Admitting: Student

## 2023-04-26 VITALS — BP 110/82 | HR 102 | Ht 64.0 in | Wt 118.7 lb

## 2023-04-26 DIAGNOSIS — R002 Palpitations: Secondary | ICD-10-CM | POA: Insufficient documentation

## 2023-04-26 DIAGNOSIS — I5020 Unspecified systolic (congestive) heart failure: Secondary | ICD-10-CM | POA: Diagnosis present

## 2023-04-26 DIAGNOSIS — Q2381 Bicuspid aortic valve: Secondary | ICD-10-CM | POA: Insufficient documentation

## 2023-04-26 DIAGNOSIS — I420 Dilated cardiomyopathy: Secondary | ICD-10-CM | POA: Diagnosis present

## 2023-04-26 NOTE — Patient Instructions (Signed)
Medication Instructions:  Your physician recommends that you continue on your current medications as directed. Please refer to the Current Medication list given to you today.  *If you need a refill on your cardiac medications before your next appointment, please call your pharmacy*  Lab Work: None ordered If you have labs (blood work) drawn today and your tests are completely normal, you will receive your results only by: MyChart Message (if you have MyChart) OR A paper copy in the mail If you have any lab test that is abnormal or we need to change your treatment, we will call you to review the results.  Follow-Up: At [facility], you and your health needs are our priority.  As part of our continuing mission to provide you with exceptional heart care, we have created designated Provider Care Teams.  These Care Teams include your primary Cardiologist (physician) and Advanced Practice Providers (APPs -  Physician Assistants and Nurse Practitioners) who all work together to provide you with the care you need, when you need it.  Your next appointment:   As needed

## 2023-05-07 ENCOUNTER — Ambulatory Visit: Payer: Medicare Other | Attending: Cardiology | Admitting: Cardiology

## 2023-05-07 ENCOUNTER — Encounter: Payer: Self-pay | Admitting: Cardiology

## 2023-05-07 VITALS — BP 132/92 | HR 90 | Ht 64.0 in | Wt 117.3 lb

## 2023-05-07 DIAGNOSIS — Z79899 Other long term (current) drug therapy: Secondary | ICD-10-CM | POA: Diagnosis not present

## 2023-05-07 DIAGNOSIS — I5020 Unspecified systolic (congestive) heart failure: Secondary | ICD-10-CM | POA: Insufficient documentation

## 2023-05-07 DIAGNOSIS — Q2381 Bicuspid aortic valve: Secondary | ICD-10-CM | POA: Insufficient documentation

## 2023-05-07 DIAGNOSIS — I77810 Thoracic aortic ectasia: Secondary | ICD-10-CM | POA: Diagnosis not present

## 2023-05-07 LAB — BASIC METABOLIC PANEL
BUN/Creatinine Ratio: 18 (ref 9–20)
BUN: 14 mg/dL (ref 6–20)
CO2: 30 mmol/L — ABNORMAL HIGH (ref 20–29)
Calcium: 10.1 mg/dL (ref 8.7–10.2)
Chloride: 98 mmol/L (ref 96–106)
Creatinine, Ser: 0.8 mg/dL (ref 0.76–1.27)
Glucose: 80 mg/dL (ref 70–99)
Potassium: 4.4 mmol/L (ref 3.5–5.2)
Sodium: 139 mmol/L (ref 134–144)
eGFR: 120 mL/min/{1.73_m2} (ref >59)

## 2023-05-07 NOTE — Addendum Note (Signed)
Addended by: Luellen Pucker on: 05/07/2023 10:49 AM   Modules accepted: Orders

## 2023-05-07 NOTE — Patient Instructions (Signed)
Medication Instructions:  Your physician recommends that you continue on your current medications as directed. Please refer to the Current Medication list given to you today.  *If you need a refill on your cardiac medications before your next appointment, please call your pharmacy*   Lab Work: Please complete a BMET in our lab today before you leave.  If you have labs (blood work) drawn today and your tests are completely normal, you will receive your results only by: MyChart Message (if you have MyChart) OR A paper copy in the mail If you have any lab test that is abnormal or we need to change your treatment, we will call you to review the results.   Testing/Procedures: Your physician has requested that you have an echocardiogram in July 2025. Echocardiography is a painless test that uses sound waves to create images of your heart. It provides your doctor with information about the size and shape of your heart and how well your heart's chambers and valves are working. This procedure takes approximately one hour. There are no restrictions for this procedure. Please do NOT wear cologne, perfume, aftershave, or lotions (deodorant is allowed). Please arrive 15 minutes prior to your appointment time.  Please note: We ask at that you not bring children with you during ultrasound (echo/ vascular) testing. Due to room size and safety concerns, children are not allowed in the ultrasound rooms during exams. Our front office staff cannot provide observation of children in our lobby area while testing is being conducted. An adult accompanying a patient to their appointment will only be allowed in the ultrasound room at the discretion of the ultrasound technician under special circumstances. We apologize for any inconvenience.    Follow-Up: At [facility], you and your health needs are our priority.  As part of our continuing mission to provide you with exceptional heart care, we have created  designated Provider Care Teams.  These Care Teams include your primary Cardiologist (physician) and Advanced Practice Providers (APPs -  Physician Assistants and Nurse Practitioners) who all work together to provide you with the care you need, when you need it.  We recommend signing up for the patient portal called "MyChart".  Sign up information is provided on this After Visit Summary.  MyChart is used to connect with patients for Virtual Visits (Telemedicine).  Patients are able to view lab/test results, encounter notes, upcoming appointments, etc.  Non-urgent messages can be sent to your provider as well.   To learn more about what you can do with MyChart, go to [email or web address].    Your next appointment:   1 year(s)  Provider:   Armanda Magic, MD

## 2023-05-07 NOTE — Progress Notes (Signed)
  Cardiology Office Note:    Date:  05/07/2023  ID:  Samuel Becker, DOB 1990-10-22, MRN [id number] PCP: Moshe Cipro, FNP  [facility] Providers Cardiologist:  Armanda Magic, MD Electrophysiologist:  Maurice Small, MD         Patient Profile:      (HFrEF) heart failure with reduced ejection fraction Non-ischemic cardiomyopathy  TTE 10/03/22: EF 35-40, global HK, mildly reduced RVSF, mod BAE, Siever's Bicuspid AV, mild SoV dilation, R-L fusion and raphe, trivial AI, Ao root 43 mm, RAP 3 CCTA 10/05/22:  mild Ao root dilations (4.1 cm); CAC score 0; no CAD; total plaque volume < 1% Intol of Entresto due to ? BP TTE 04/18/2023: EF 50-55%, trivial MR/TR, AoR 41mm Bicuspid Aortic Valve Mild SoV dilation Spina bifida  Neurogenic bladder         History of Present Illness:   Samuel Becker is a 32 y.o. male who returns for f/u of CHF. He is here today for followup and is doing well. He had some problems with racing of his heart beat but wore a heart monitor that was normal  with rare PACs and PVCs. He denies any chest pain or pressure, SOB, DOE, PND, orthopnea, LE edema, dizziness, palpitations or syncope. He is compliant with his meds and is tolerating meds with no SE.     ROS  See HPI    Studies Reviewed:        Physical Exam:   VS:  BP (!) 132/92   Pulse 90   Ht 5\' 4"  (1.626 m)   Wt 117 lb 4.8 oz (53.2 kg)   SpO2 94%   BMI 20.13 kg/m    Wt Readings from Last 3 Encounters:  05/07/23 117 lb 4.8 oz (53.2 kg)  04/26/23 118 lb 11.2 oz (53.8 kg)  03/15/23 114 lb (51.7 kg)   GEN: Well nourished, well developed in no acute distress HEENT: Normal NECK: No JVD; No carotid bruits LYMPHATICS: No lymphadenopathy CARDIAC:RRR, no murmurs, rubs, gallops RESPIRATORY:  Clear to auscultation without rales, wheezing or rhonchi  ABDOMEN: Soft, non-tender, non-distended MUSCULOSKELETAL:  No edema; No deformity  SKIN: Warm and dry NEUROLOGIC:  Alert and oriented x  3 PSYCHIATRIC:  Normal affect     ASSESSMENT AND PLAN:    HFrEF (heart failure with reduced ejection fraction) (HCC) -Non-ischemic cardiomyopathy.  -coronary CTA with Co Cal 0 and no CAD -Initial EF 35-40% and now improved to 50-55% on TTE 03/2023>>? if cardiomyopathy due to acute illness from pneumonia and sepsis.  -soft BP has precluded use of ACE/ARB.  -He should not be placed on SGLT2i given neurogenic bladder. He appears euvolemic on exam today Continue prescription drug management with Lasix 40 mg daily, losartan 12.5 mg daily, Toprol-XL 25 mg daily and spironolactone 12.5 mg daily with as needed refills Check BMET  Bicuspid aortic valve -For with bicuspid aortic valve and mild AI  -Repeat 2D echo 12/2023    Dilated aortic root (HCC) -Aortic root dimension 41 mm on 2D echo 03/2023  -Repeat echo in 12/2022       Dispo: 1 year followup  Signed, Armanda Magic, MD

## 2023-05-09 ENCOUNTER — Telehealth: Payer: Self-pay

## 2023-05-09 NOTE — Telephone Encounter (Signed)
-----   Message from Armanda Magic sent at 05/08/2023 11:31 AM EST ----- Please let patient know that labs were normal.  Continue current medical therapy.

## 2023-05-09 NOTE — Telephone Encounter (Signed)
Call to patient to advise that labs were normal, no answer, left detailed message per DPR that labs are normal and to continue current medications. Advised patient to call our office if any questions.

## 2023-05-28 ENCOUNTER — Telehealth (HOSPITAL_BASED_OUTPATIENT_CLINIC_OR_DEPARTMENT_OTHER): Payer: Self-pay | Admitting: Pulmonary Disease

## 2023-05-28 DIAGNOSIS — J9611 Chronic respiratory failure with hypoxia: Secondary | ICD-10-CM

## 2023-05-30 ENCOUNTER — Encounter (HOSPITAL_BASED_OUTPATIENT_CLINIC_OR_DEPARTMENT_OTHER): Payer: Self-pay

## 2023-05-30 NOTE — Telephone Encounter (Signed)
OK to discontinue oxygen. DME [employer]

## 2023-07-02 ENCOUNTER — Telehealth (HOSPITAL_BASED_OUTPATIENT_CLINIC_OR_DEPARTMENT_OTHER): Payer: Self-pay | Admitting: Pulmonary Disease

## 2023-07-02 NOTE — Telephone Encounter (Signed)
 Patient father called in stating patient is now on the ventilator and no long using O2 equipment. Father states called in a month ago for equipment to be picked up and has not been picked up. Advised to reach out to [employer].  Please advise and call patients father back.

## 2023-07-04 NOTE — Telephone Encounter (Signed)
 Please contact family to let him know that we contacted and faxed [employer] with orders on 05/30/23 to discontinue oxygen.  We can send in cancellation orders again but he may need to contact [employer] directly for pick-up.

## 2023-07-04 NOTE — Telephone Encounter (Signed)
 Results have been relayed to the patient/authorized caretaker. The patient/authorized caretaker verbalized understanding. No questions at this time.

## 2023-07-16 ENCOUNTER — Telehealth: Payer: Self-pay | Admitting: Cardiology

## 2023-07-16 DIAGNOSIS — I5020 Unspecified systolic (congestive) heart failure: Secondary | ICD-10-CM

## 2023-07-16 DIAGNOSIS — Z79899 Other long term (current) drug therapy: Secondary | ICD-10-CM

## 2023-07-16 NOTE — Telephone Encounter (Signed)
Patient's father is returning phone call due to patient gaining weight gain

## 2023-07-16 NOTE — Telephone Encounter (Signed)
Returned call from Audley Hose, patient's father (DPR). According to [facility], patient has had weight gain and leg swelling over the past week. Judie Grieve reports that on 07/07/23 patient weighed 115 lbs. Today patient weighs 126 lbs. Judie Grieve reports patient has swelling in his legs from his knees to his thighs. (Patient has spina bifida and Judie Grieve reports that he doesn't always gain weight in his lower legs, he is more likely to have leg swelling in his upper legs and abdomen due to his anatomy.) Judie Grieve denies patient has any SOB and reports his O2 saturations are 100% on room air. Judie Grieve does not have any recent BP or HR readings to share.  Verified that patient is currently taking Lasix 40 mg daily, losartan 12.5 mg daily, Toprol-XL 25 mg daily and spironolactone 12.5 mg daily as discussed at last OV with Dr. Mayford Knife on 05/06/24. His last Creatinine was 0.8 on 05/07/23. Judie Grieve is asking if Dr. Mayford Knife will increase lasix dose to help resolve swelling.

## 2023-07-17 NOTE — Telephone Encounter (Signed)
Call to [facility] (father-DPR) to discuss Dr. Norris Cross recommendations. No answer, left detailed message per DPR. Advised to Increase Lasix to 40 mg twice daily x 3 days then go back to 40 mg daily and take an extra 40 mg daily for weight gain greater than 3 pounds in 1 day or 5 pounds in 1 week or increased lower extremity edema.  Advised to check bmet in 1 week and explain I would send orders to [facility] so that patient can go to any facility on or around 07/24/23 to do these nonfasting labs.  Asked patient/father to call and let us know they got our message, also advised to call if weight does not decrease or if lower extremity edema does not improve.

## 2023-07-17 NOTE — Addendum Note (Signed)
Addended by: Luellen Pucker on: 07/17/2023 01:41 PM   Modules accepted: Orders

## 2023-07-19 NOTE — Telephone Encounter (Signed)
Confirmed with father Judie Grieve [facility]) that patient did start the extra doses of lasix. Judie Grieve states  patient is already down 2 lbs from a top weight of 123 and his legs look better. Judie Grieve states he just gave patient his second dose of lasix today (3rd day). He expresses he is still worried that patient has not diuresed enough and would like patient to be seen. DOD appt w/ Dr. Jacinto Halim made for 07/23/23. Oris Drone to continue to track patient's weight, BP. Judie Grieve denies patient reporting any lightheadedness or dizziness though they have not been tracking BP as of yet. Judie Grieve states he will call in tomorrow to discuss with someone whether patient has continued to improve and DOD appt can be canceled or whether he feels patient needs more lasix.

## 2023-07-23 ENCOUNTER — Encounter: Payer: Self-pay | Admitting: Cardiology

## 2023-07-23 ENCOUNTER — Other Ambulatory Visit: Payer: Self-pay | Admitting: *Deleted

## 2023-07-23 ENCOUNTER — Ambulatory Visit: Payer: Medicare Other | Attending: Cardiology | Admitting: Cardiology

## 2023-07-23 VITALS — BP 120/82 | HR 104 | Resp 16 | Ht 64.0 in | Wt 120.0 lb

## 2023-07-23 DIAGNOSIS — R000 Tachycardia, unspecified: Secondary | ICD-10-CM | POA: Insufficient documentation

## 2023-07-23 DIAGNOSIS — Q2381 Bicuspid aortic valve: Secondary | ICD-10-CM | POA: Insufficient documentation

## 2023-07-23 DIAGNOSIS — I5032 Chronic diastolic (congestive) heart failure: Secondary | ICD-10-CM | POA: Insufficient documentation

## 2023-07-23 DIAGNOSIS — Z79899 Other long term (current) drug therapy: Secondary | ICD-10-CM

## 2023-07-23 DIAGNOSIS — G8220 Paraplegia, unspecified: Secondary | ICD-10-CM | POA: Diagnosis present

## 2023-07-23 DIAGNOSIS — R600 Localized edema: Secondary | ICD-10-CM | POA: Insufficient documentation

## 2023-07-23 DIAGNOSIS — I5020 Unspecified systolic (congestive) heart failure: Secondary | ICD-10-CM

## 2023-07-23 MED ORDER — METOPROLOL SUCCINATE ER 50 MG PO TB24: 50.0000 mg | ORAL_TABLET | Freq: Every day | ORAL | Status: DC

## 2023-07-23 NOTE — Patient Instructions (Signed)
Medication Instructions:  Your physician has recommended you make the following change in your medication:  Increase metoprolol succinate to 50 mg by mouth daily   *If you need a refill on your cardiac medications before your next appointment, please call your pharmacy*   Lab Work: Have BMP, BNP and Magnesium level checked at [facility] on the first floor today If you have labs (blood work) drawn today and your tests are completely normal, you will receive your results only by: MyChart Message (if you have MyChart) OR A paper copy in the mail If you have any lab test that is abnormal or we need to change your treatment, we will call you to review the results.   Testing/Procedures: none   Follow-Up: At [facility], you and your health needs are our priority.  As part of our continuing mission to provide you with exceptional heart care, we have created designated Provider Care Teams.  These Care Teams include your primary Cardiologist (physician) and Advanced Practice Providers (APPs -  Physician Assistants and Nurse Practitioners) who all work together to provide you with the care you need, when you need it.  We recommend signing up for the patient portal called "MyChart".  Sign up information is provided on this After Visit Summary.  MyChart is used to connect with patients for Virtual Visits (Telemedicine).  Patients are able to view lab/test results, encounter notes, upcoming appointments, etc.  Non-urgent messages can be sent to your provider as well.   To learn more about what you can do with MyChart, go to [email or web address].    Your next appointment:   6 week(s)  Provider:   Armanda Magic, MD  or APP    Other Instructions

## 2023-07-23 NOTE — Progress Notes (Signed)
Cardiology Office Note:  .   Date:  07/23/2023  ID:  Samuel Becker, DOB 12/23/1990, MRN [id number] PCP: Quintella Reichert, MD  [facility] Providers Cardiologist:  Armanda Magic, MD Electrophysiologist:  Maurice Small, MD   History of Present Illness: .   Samuel Becker is a 33 y.o. Male patient with spina bifida and wheelchair-bound, essentially paraplegic, neurogenic bladder nonischemic dilated cardiomyopathy with moderate LV systolic dysfunction by echocardiogram on 10/03/2022 when he was admitted with septic shock from  09/20/22-09/26/22  and again  10/02/22-10/07/22 for pneumonia and acute decompensated heart failure.  Since then has LV function recovered to 50 to 55% by echo on 04/18/2023, bicuspid aortic valve, mild aortic root dilatation at 41 to 43 mm.  Is also on BiPAP at home due to central severe sleep apnea and restrictive lung disease.  Seen as a sick work in today, due to worsening leg edema and increasing weight.  Discussed the use of AI scribe software for clinical note transcription with the patient, who gave verbal consent to proceed.  History of Present Illness    The patient, with spina bifida and scoliosis, presents with leg swelling and weight fluctuations. He is accompanied by his stepmom and a Financial controller. He is being followed in our office by Dr. Carolanne Grumbling.  He experiences leg swelling and weight fluctuations, initially noticed during a routine weigh-in. His weight varies, attributed to Lasix use and participation in a bowel program every other day. Post-bowel program, his weight decreases but subsequently increases. Swelling is present in his upper legs, feet, and stomach, with the stomach appearing distended or stiff. He follows a low sodium diet and wears support stockings to manage dependent edema, initiated after an illness last year.  He uses a ventilator nightly due to respiratory issues, specifically incomplete lung expansion. He has a history of  elevated CO2 levels, managed with the ventilator, and has a follow-up with pulmonary specialists in March. No shortness of breath or chest pain.  Labs   Lab Results  Component Value Date   NA 139 05/07/2023   K 4.4 05/07/2023   CO2 30 (H) 05/07/2023   GLUCOSE 80 05/07/2023   BUN 14 05/07/2023   CREATININE 0.80 05/07/2023   CALCIUM 10.1 05/07/2023   EGFR 120 05/07/2023   GFRNONAA >60 10/07/2022      Latest Ref Rng & Units 05/07/2023   11:41 AM 11/03/2022    2:52 PM 10/07/2022    7:20 AM  BMP  Glucose 70 - 99 mg/dL 80  73  88   BUN 6 - 20 mg/dL 14  20  17    Creatinine 0.76 - 1.27 mg/dL 9.52  8.41  3.24   BUN/Creat Ratio 9 - 20 18  22     Sodium 134 - 144 mmol/L 139  141  135   Potassium 3.5 - 5.2 mmol/L 4.4  4.3  4.0   Chloride 96 - 106 mmol/L 98  97  92   CO2 20 - 29 mmol/L 30  25  33   Calcium 8.7 - 10.2 mg/dL 40.1  02.7  8.9       Latest Ref Rng & Units 10/07/2022    7:20 AM 10/06/2022    7:26 AM 10/05/2022    5:45 AM  CBC  WBC 4.0 - 10.5 K/uL 8.1  8.1  8.9   Hemoglobin 13.0 - 17.0 g/dL 25.3  66.4  40.3   Hematocrit 39.0 - 52.0 % 43.6  45.3  42.9  Platelets 150 - 400 K/uL 208  234  240    BNP (last 3 results) Recent Labs    10/02/22 1632 10/07/22 0717  BNP 335.1* 12.4   Review of Systems  Cardiovascular:  Positive for leg swelling (mild worsening). Negative for chest pain and dyspnea on exertion.  Respiratory:  Positive for sleep disturbances due to breathing (central sleep apnea on BIPAP).    Physical Exam:   VS:  BP 120/82 (BP Location: Right Arm, Patient Position: Sitting, Cuff Size: Normal)   Pulse (!) 104   Resp 16   Ht 5\' 4"  (1.626 m)   Wt 120 lb (54.4 kg) Comment: Patient reported  SpO2 97%   BMI 20.60 kg/m    Wt Readings from Last 3 Encounters:  07/23/23 120 lb (54.4 kg)  05/07/23 117 lb 4.8 oz (53.2 kg)  04/26/23 118 lb 11.2 oz (53.8 kg)    Physical Exam Neck:     Vascular: No carotid bruit or JVD.  Cardiovascular:     Rate and Rhythm:  Normal rate and regular rhythm.     Pulses: Intact distal pulses.     Heart sounds: Normal heart sounds. No murmur heard.    No gallop.  Pulmonary:     Effort: Pulmonary effort is normal.     Breath sounds: Normal breath sounds.     Comments: Scoliosis present Abdominal:     General: Bowel sounds are normal.     Palpations: Abdomen is soft.     Tenderness: There is no abdominal tenderness.     Comments: Deformed and slightly swollen from scoliosis  Musculoskeletal:     Right lower leg: No edema.     Left lower leg: No edema.    Studies Reviewed: Marland Kitchen    ECHOCARDIOGRAM LIMITED 04/18/2023  1. LIMITED ECHOCARDIOGRAM. 2. Left ventricular ejection fraction, by estimation, is 50 to 55%. The left ventricle has low normal function. 3. Right ventricular systolic function grossly normal. 4. The mitral valve is grossly normal. Trivial mitral valve regurgitation. No evidence of mitral stenosis. 5. There is dilatation of the aortic root, measuring 41 mm.  EKG:    EKG Interpretation Date/Time:  Monday July 23 2023 14:31:21 EST Ventricular Rate:  101 PR Interval:  128 QRS Duration:  80 QT Interval:  314 QTC Calculation: 407 R Axis:   160  Text Interpretation: EKG 07/23/2023: Sinus tachycardia at rate of 101 bpm, right axis deviation, left posterior fascicular block.  Left atrial enlargement, nonspecific T abnormality.  Compared to 02/13/2023, sinus tachycardia new.  Nonspecific T abnormality in the inferior leads and anterior leads new. Confirmed by Delrae Rend [phone]) on 07/23/2023 2:39:35 PM    Medications and allergies    Allergies  Allergen Reactions   Morphine And Codeine Other (See Comments)    Hallucinations    Septra [Sulfamethoxazole-Trimethoprim] Other (See Comments)    Gi upset    Sulfamethoxazole Other (See Comments)    Gi upset    Trimethoprim Other (See Comments)    Unknown    Latex Swelling and Rash     Current Outpatient Medications:    acetaminophen  (TYLENOL) 325 MG tablet, Take 325 mg by mouth as needed for moderate pain., Disp: , Rfl:    cetirizine (ZYRTEC) 10 MG tablet, Take 10 mg by mouth daily., Disp: , Rfl:    furosemide (LASIX) 40 MG tablet, Take 1 tablet (40 mg total) by mouth daily., Disp: 90 tablet, Rfl: 4   losartan (COZAAR) 25 MG tablet, Take  0.5 tablets (12.5 mg total) by mouth daily., Disp: 45 tablet, Rfl: 3   Multiple Vitamins-Minerals (MULTIVITAMIN WITH MINERALS) tablet, Take 1 tablet by mouth daily., Disp: , Rfl:    oxybutynin (DITROPAN XL) 15 MG 24 hr tablet, Take 15 mg by mouth at bedtime., Disp: , Rfl:    Potassium Chloride ER 20 MEQ TBCR, Take 1 tablet (20 mEq total) by mouth daily., Disp: 90 tablet, Rfl: 3   spironolactone (ALDACTONE) 25 MG tablet, Take 0.5 tablets (12.5 mg total) by mouth daily., Disp: 180 tablet, Rfl: 3   VITAMIN D PO, Take 1 capsule by mouth daily., Disp: , Rfl:    calcium carbonate (SUPER CALCIUM) 1500 (600 Ca) MG TABS tablet, Take 600 mg of elemental calcium by mouth daily with breakfast., Disp: , Rfl:    metoprolol succinate (TOPROL XL) 50 MG 24 hr tablet, Take 1 tablet (50 mg total) by mouth daily., Disp: , Rfl:    ASSESSMENT AND PLAN: .      ICD-10-CM   1. Chronic diastolic heart failure (HCC)  A21.30 EKG 12-Lead    metoprolol succinate (TOPROL XL) 50 MG 24 hr tablet    Pro b natriuretic peptide    Magnesium    2. Bilateral leg edema  R60.0     3. Bicuspid aortic valve  Q23.81     4. Paraplegia (HCC)  G82.20     5. Sinus tachycardia  R00.0 metoprolol succinate (TOPROL XL) 50 MG 24 hr tablet        Assessment and Plan    Chronic Diastolic Heart Failure Presents with weight fluctuations and leg edema. Ejection fraction normalized. Current medications: Lasix, losartan, spironolactone, metoprolol succinate. Suspected mild heart failure symptoms. Discussed risks of progression, benefits of medication adjustment, and need for monitoring. Increasing metoprolol succinate may reduce heart  rate and improve diastolic function. If symptoms persist, consider increasing spironolactone for fluid retention. - Increase metoprolol succinate to 50 mg once daily - Order BMP, BNP, and magnesium - Monitor weight and leg edema - Consider increasing spironolactone if symptoms persist - Follow up in six weeks  Leg Edema Likely related to heart failure and fluid retention. Using support stockings and bowel program to manage fluid balance. Discussed benefits of support stockings and potential diuretic adjustment if swelling worsens. - Continue using support stockings - Monitor leg edema and report any increase - Consider increasing spironolactone if leg edema worsens  Kyphoscoliosis with Restricted Lung Disease Uses BiPAP ventilator at night due to restricted lung disease and elevated CO2 levels. Elevated heart rate likely due to respiratory issues. Discussed importance of BiPAP use and monitoring respiratory status. - Continue using BiPAP ventilator at night - Monitor respiratory status and heart rate - Follow up with pulmonary specialist in March  General Health Maintenance Advised to maintain low sodium diet and monitor calorie intake to prevent excessive weight gain, which could exacerbate heart failure symptoms. Discussed importance of diet in managing heart failure and overall health. - Maintain low sodium diet - Monitor calorie intake and avoid excessive snacking  Follow-up - Follow up in six weeks with cardiology - Update via MyChart on medication response and symptoms.   Total time spent in evaluation of the patient, review of prior medical records, face-to-face discussion with the patient and his parents at the bedside was 48 minutes.  Signed,  Yates Decamp, MD, [facility] 07/23/2023, 4:50 PM [facility] [address] [place], [place] [address] Phone: [phone]. Fax:  [phone]

## 2023-07-24 ENCOUNTER — Encounter: Payer: Self-pay | Admitting: Cardiology

## 2023-07-24 LAB — BASIC METABOLIC PANEL WITH GFR
BUN/Creatinine Ratio: 20 (ref 9–20)
BUN: 13 mg/dL (ref 6–20)
CO2: 28 mmol/L (ref 20–29)
Calcium: 9.9 mg/dL (ref 8.7–10.2)
Chloride: 99 mmol/L (ref 96–106)
Creatinine, Ser: 0.66 mg/dL — ABNORMAL LOW (ref 0.76–1.27)
Glucose: 74 mg/dL (ref 70–99)
Potassium: 4.5 mmol/L (ref 3.5–5.2)
Sodium: 139 mmol/L (ref 134–144)
eGFR: 127 mL/min/1.73

## 2023-07-24 LAB — MAGNESIUM: Magnesium: 2.4 mg/dL — ABNORMAL HIGH (ref 1.6–2.3)

## 2023-07-24 LAB — PRO B NATRIURETIC PEPTIDE: NT-Pro BNP: 64 pg/mL (ref 0–86)

## 2023-07-24 NOTE — Progress Notes (Signed)
BNP does not suggest heart failure and magnesium level is normal. Hence weight gain could be related to good apatite.  My Chart message sent

## 2023-07-25 NOTE — Telephone Encounter (Signed)
 I spoke with patient's father and reviewed lab results with him Father reports Dr Berry Bristol requested update on patient's weight Patient weights- 2/3-120 lbs 2/4-121.5 lbs 2/5-122.9 lbs

## 2023-08-22 ENCOUNTER — Encounter: Payer: Self-pay | Admitting: Cardiology

## 2023-08-22 DIAGNOSIS — R000 Tachycardia, unspecified: Secondary | ICD-10-CM

## 2023-08-22 DIAGNOSIS — I5032 Chronic diastolic (congestive) heart failure: Secondary | ICD-10-CM

## 2023-08-22 MED ORDER — METOPROLOL SUCCINATE ER 50 MG PO TB24: 50.0000 mg | ORAL_TABLET | Freq: Every day | ORAL | 3 refills | Status: AC

## 2023-08-25 ENCOUNTER — Other Ambulatory Visit: Payer: Self-pay | Admitting: Physician Assistant

## 2023-08-30 ENCOUNTER — Encounter (HOSPITAL_BASED_OUTPATIENT_CLINIC_OR_DEPARTMENT_OTHER): Payer: Self-pay | Admitting: Pulmonary Disease

## 2023-08-30 ENCOUNTER — Ambulatory Visit (HOSPITAL_BASED_OUTPATIENT_CLINIC_OR_DEPARTMENT_OTHER): Payer: Medicare Other | Admitting: Pulmonary Disease

## 2023-08-30 VITALS — BP 118/74 | HR 81 | Ht 64.0 in | Wt 110.0 lb

## 2023-08-30 DIAGNOSIS — J9612 Chronic respiratory failure with hypercapnia: Secondary | ICD-10-CM

## 2023-08-30 DIAGNOSIS — J9611 Chronic respiratory failure with hypoxia: Secondary | ICD-10-CM | POA: Diagnosis not present

## 2023-08-30 NOTE — Progress Notes (Signed)
 Subjective:   PATIENT ID: Samuel Becker GENDER: male DOB: 11-22-1990, MRN: [id number]  Chief Complaint  Patient presents with   Follow-up    Chronic hypoxemic respiratory failure    Reason for Visit: Follow-up  Samuel Becker is a 33 year old wheelchair bound male never smoker with spina bifida, functional paraplegia, neurogenic bladder, GERD who presents for follow-up. As baseline he is independent and able to self-transfer. Routinely volunteers. Presents with parents who provide additional pertinent history.  Initial consult He was recently hospitalized from 4/3-4/9 and readmitted 10/02/22-10/07/22 for pneumonia (discharge summaries reviewed) and heart failure requiring diuresis and antibiotics. Discharged on 2L. He currently only wears oxygen at night. During the daytime SpO2 on his wrist monitor is >90%. Denies shortness of breath, cough and wheezing. Back to baseline activity including upper body exercises and independent of ADLs.  12/14/22 Since our last visit he has been compliant with his nightly O2. He will pick up his ONO today. Denies shortness of breath, wheezing or cough.  Symptoms worsen whenever he is ill.  He is back volunteering at [employer] and exercising. Blood pressure was low today. Recently changed BP medications with cardiology.  03/15/23 Since our last visit he wears oxygen nightly. Has BiPAP and uses it when his reflux is not affecting him. On protonix and continues to have problems at times. Denies shortness of breath, wheezing or cough. Back to his baseline activities of volunteering and working out daily. Mother presents and provides support and information.  08/30/23 Since our last visit he is overall doing well. No exacerbations/URI since our last visit. Denies shortness of breath, cough or wheezing. He reports compliance with his BiPAP nightly. Sometimes has shortness of breath with heavy exertion. He is volunteering. Not working out lately due to  cold weather.  Social History: Rarely smokes cigars  Past Medical History:  Diagnosis Date   HFrEF (heart failure with reduced ejection fraction) (HCC) 10/02/2022   Limited TTE 12/26/2022: EF 30-35, GR 1 DD, normal RVSF, bicuspid AV, mild AI, aortic root 39 mm, RAP 3     Spina bifida (HCC)      Family History  Problem Relation Age of Onset   Hypertension Father    Heart disease Maternal Grandmother    Heart attack Maternal Grandfather    Heart attack Paternal Grandmother    Heart attack Paternal Grandfather 54     Social History   Occupational History   Occupation: Acupuncturist at [employer]  Tobacco Use   Smoking status: Never   Smokeless tobacco: Never  Vaping Use   Vaping status: Never Used  Substance and Sexual Activity   Alcohol use: Not Currently    Comment: occ   Drug use: Never   Sexual activity: Not on file    Allergies  Allergen Reactions   Morphine And Codeine Other (See Comments)    Hallucinations    Septra [Sulfamethoxazole-Trimethoprim] Other (See Comments)    Gi upset    Sulfamethoxazole Other (See Comments)    Gi upset    Trimethoprim Other (See Comments)    Unknown    Latex Swelling and Rash     Outpatient Medications Prior to Visit  Medication Sig Dispense Refill   acetaminophen (TYLENOL) 325 MG tablet Take 325 mg by mouth as needed for moderate pain.     calcium carbonate (SUPER CALCIUM) 1500 (600 Ca) MG TABS tablet Take 600 mg of elemental calcium by mouth daily with breakfast.     cetirizine (  ZYRTEC) 10 MG tablet Take 10 mg by mouth daily.     furosemide (LASIX) 40 MG tablet Take 1 tablet (40 mg total) by mouth daily. 90 tablet 4   metoprolol succinate (TOPROL XL) 50 MG 24 hr tablet Take 1 tablet (50 mg total) by mouth daily. 90 tablet 3   Multiple Vitamins-Minerals (MULTIVITAMIN WITH MINERALS) tablet Take 1 tablet by mouth daily.     oxybutynin (DITROPAN XL) 15 MG 24 hr tablet Take 15 mg by mouth at bedtime.     Potassium Chloride ER 20 MEQ  TBCR Take 1 tablet (20 mEq total) by mouth daily. 90 tablet 3   VITAMIN D PO Take 1 capsule by mouth daily.     losartan (COZAAR) 25 MG tablet Take 0.5 tablets (12.5 mg total) by mouth daily. 45 tablet 3   spironolactone (ALDACTONE) 25 MG tablet Take 0.5 tablets (12.5 mg total) by mouth daily. 180 tablet 3   No facility-administered medications prior to visit.    Review of Systems  Constitutional:  Negative for chills, diaphoresis, fever, malaise/fatigue and weight loss.  HENT:  Negative for congestion.   Respiratory:  Negative for cough, hemoptysis, sputum production, shortness of breath and wheezing.   Cardiovascular:  Negative for chest pain, palpitations and leg swelling.     Objective:   Vitals:   08/30/23 1029  BP: 118/74  Pulse: 81  SpO2: 92%  Weight: 110 lb (49.9 kg)  Height: 5\' 4"  (1.626 m)   SpO2: 92 %  Physical Exam: General: Well-appearing, no acute distress, wheelchairbound HENT: [place], AT Eyes: EOMI, no scleral icterus Respiratory: Clear to auscultation bilaterally.  No crackles, wheezing or rales Cardiovascular: RRR, -M/R/G, no JVD Extremities:-Edema,-tenderness Neuro: AAO x4, CNII-XII grossly intact Psych: Normal mood, normal affect   Data Reviewed:  Imaging: CXR 10/07/22 - Diffuse bilateral infiltrates CXR 11/03/22 Improved/nearly resolved infiltrates  PFT: 12/14/22 FVC 1.14 (30%) FEV1 1.12 (35%) Ratio 95 TLC 50% DLCO 71% Interpretation: Very severe restrictive defect with mildly reduced DLCO.  No significant bronchodilator response   Labs: ABG    Component Value Date/Time   PHART 7.46 (H) 11/30/2022 1108   PCO2ART 45 11/30/2022 1108   PO2ART 79 (L) 11/30/2022 1108   HCO3 32.0 (H) 11/30/2022 1108   TCO2 30 09/20/2022 2031   ACIDBASEDEF 1.0 09/20/2022 2031   O2SAT 97.8 11/30/2022 1108   VBG 10/02/22 10/02/22 - 7.3/65/35 - chronic respiratory failure      Assessment & Plan:   Discussion: 33 year old wheelchair bound male never smoker with  spina bifida, functional paraplegia, neurogenic bladder, GERD, chronic hypoxemic respiratory failure who presents for follow-up. Doing well and tolerating NIV. Benefits from nocturnal vent in setting of neuromuscular weakness with FVC <50%. Requesting compliance report from DME - verbal confirmation obtained on phone that DME will send report in 24 hours to our fax location.  Per Chest guidelines, patients would benefit from NIV with any fall in FVC to <80% of predicted with symptoms or FVC to <50% of predicted without symptoms or SNIP/MIP to <-40 cmg H20 or hypercapnia would support the initiation of NIV or further testing.   Chronic hypoxemic and hypercarbic respiratory failure Very severe restrictive defect secondary neuromuscular weakness (FVC 30%) --CONTINUE non-invasive ventilation nightly. Email me if unable to obtain at the end of visit --DME: [employer]  Patient's chronic respiratory failure due to diaphragmatic weakness in setting to paraplegia is life threatening.  Previous ABG's have documented high PCO2 during acute illness.  Patient would benefit  from non-invasive ventilation.  Without this therapy, the patient is at high risk of ending up with worsening symptoms, worsened respiratory failure, need for ER visits and/or recurrent hospitalizations.  Bilevel device unable to adequately support patient's nocturnal ventilation needs.  Patient would benefit from NIV therapy with set tidal volumes and pressure.   Health Maintenance Immunization History  Administered Date(s) Administered   Fluad Trivalent(High Dose 65+) 03/21/2011   HPV Quadrivalent 10/25/2009, 03/24/2013, 05/21/2014   Hepatitis A, Adult 11/16/2009   Influenza Inj Mdck Quad With Preservative 04/08/2014   Influenza,Quad,Nasal, Live 03/24/2013   Influenza,inj,Quad PF,6+ Mos 03/19/2015, 07/26/2017   Influenza,trivalent, recombinat, inj, PF 03/22/2012   Meningococcal Conjugate 03/24/2013   Pneumococcal Polysaccharide-23  11/16/2009   Td 08/14/2003   Tdap 05/21/2014   CT Lung Screen - not qualified  No orders of the defined types were placed in this encounter. No orders of the defined types were placed in this encounter.   Return in about 8 months (around 05/01/2024).  I have spent a total time of 35-minutes on the day of the appointment including chart review, data review, collecting history, coordinating care and discussing medical diagnosis and plan with the patient/family. Past medical history, allergies, medications were reviewed. Pertinent imaging, labs and tests included in this note have been reviewed and interpreted independently by me.  Lutisha Knoche Mechele Collin, MD [facility] 08/30/2023 10:48 AM

## 2023-08-30 NOTE — Patient Instructions (Signed)
 Chronic hypoxemic and hypercarbic respiratory failure Very severe restrictive defect secondary neuromuscular weakness (FVC 30%) --CONTINUE non-invasive ventilation nightly.  Will assess 90 day compliance at today. Email me if unable to obtain at the end of visit

## 2023-09-10 ENCOUNTER — Ambulatory Visit: Payer: Medicare Other | Attending: Physician Assistant | Admitting: Physician Assistant

## 2023-09-10 ENCOUNTER — Encounter: Payer: Self-pay | Admitting: Physician Assistant

## 2023-09-10 VITALS — BP 118/90 | HR 78 | Ht 64.0 in | Wt 109.5 lb

## 2023-09-10 DIAGNOSIS — R030 Elevated blood-pressure reading, without diagnosis of hypertension: Secondary | ICD-10-CM | POA: Insufficient documentation

## 2023-09-10 DIAGNOSIS — I77810 Thoracic aortic ectasia: Secondary | ICD-10-CM | POA: Diagnosis present

## 2023-09-10 DIAGNOSIS — I5032 Chronic diastolic (congestive) heart failure: Secondary | ICD-10-CM | POA: Diagnosis present

## 2023-09-10 NOTE — Patient Instructions (Addendum)
 Medication Instructions:  Your physician recommends that you continue on your current medications as directed. Please refer to the Current Medication list given to you today.  *If you need a refill on your cardiac medications before your next appointment, please call your pharmacy*   Lab Work: None ordered If you have labs (blood work) drawn today and your tests are completely normal, you will receive your results only by: MyChart Message (if you have MyChart) OR A paper copy in the mail If you have any lab test that is abnormal or we need to change your treatment, we will call you to review the results.   Testing/Procedures: None ordered   Follow-Up: At [facility], you and your health needs are our priority.  As part of our continuing mission to provide you with exceptional heart care, we have created designated Provider Care Teams.  These Care Teams include your primary Cardiologist (physician) and Advanced Practice Providers (APPs -  Physician Assistants and Nurse Practitioners) who all work together to provide you with the care you need, when you need it.  We recommend signing up for the patient portal called "MyChart".  Sign up information is provided on this After Visit Summary.  MyChart is used to connect with patients for Virtual Visits (Telemedicine).  Patients are able to view lab/test results, encounter notes, upcoming appointments, etc.  Non-urgent messages can be sent to your provider as well.   To learn more about what you can do with MyChart, go to [email or web address].    Your next appointment:   6 month(s)  Provider:   Armanda Magic, MD     Other Instructions Call if your blood pressure is over 130/89 3 times in a row    1st Floor: - Lobby - Registration  - Pharmacy  - Lab - Cafe  2nd Floor: - PV Lab - Diagnostic Testing (echo, CT, nuclear med)  3rd Floor: - Vacant  4th Floor: - TCTS (cardiothoracic surgery) - AFib Clinic - Structural  Heart Clinic - Vascular Surgery  - Vascular Ultrasound  5th Floor: - HeartCare Cardiology (general and EP) - Clinical Pharmacy for coumadin, hypertension, lipid, weight-loss medications, and med management appointments    Valet parking services will be available as well.

## 2023-09-10 NOTE — Progress Notes (Signed)
 Cardiology Office Note:    Date:  09/10/2023  ID:  RAWLIN REAUME, DOB Jun 17, 1991, MRN [id number] PCP: Quintella Reichert, MD  [facility] Providers Cardiologist:  Armanda Magic, MD Electrophysiologist:  Maurice Small, MD       Patient Profile:      (HFrEF) heart failure with reduced ejection fraction Non-ischemic cardiomyopathy (dx in setting of pneumonia, sepsis) TTE 10/03/22: EF 35-40, global HK, mildly reduced RVSF, mod BAE, Siever's Bicuspid AV, mild SoV dilation, R-L fusion and raphe, trivial AI, Ao root 43 mm, RAP 3 CCTA 10/05/22:  mild Ao root dilations (4.1 cm); CAC score 0; no CAD; total plaque volume < 1% TTE 12/26/2022: EF 30-35, GR 1 DD, normal RVSF, mild AI, aortic root 39 mm Monitor 02/2023: NSR, average heart rate 85, no arrhythmias Limited TTE 04/18/2023: EF 50-55, trivial MR, aortic root 41 mm Intol fo Entresto due to ? BP Bicuspid Aortic Valve Mild SoV dilation Spina bifida  Neurogenic bladder           Discussed the use of AI scribe software for clinical note transcription with the patient, who gave verbal consent to proceed.  History of Present Illness Samuel Becker is a 33 y.o. male who returns for follow up of CHF. I last saw him in June 2024.  EF remains low.  GDMT has been limited by hypotension.  He cannot take the SGLT2 inhibitor secondary to neurogenic bladder.  He did see EP (Dr. Nelly Laurence).  However follow-up echocardiogram demonstrated improved LV function with EF 50-55 in October 2024.  Therefore, he does not require ICD implantation.  He was last seen by Dr. Mayford Knife 04/2023.  He was seen recently 2023/08/03 by Dr. Jacinto Halim for lower extremity edema and weight gain.  Metoprolol succinate was increased to 50 mg daily to improve heart rate.  NT-proBNP was normal.  He is here with his father.  The leg swelling has improved since he was last seen, and he has not experienced any further swelling. His weight was recorded as 120 pounds in February, but  he is unsure of the accuracy. His blood pressure was elevated today, which he attributes to a salty meal of chicken fries. He monitors his blood pressure at home. He uses a ventilator at night.   ROS-See HPI    Studies Reviewed:        Results 2023/08/03: K 4.5, SCr 0.66, NT-Pro BNP 64    Risk Assessment/Calculations:     HYPERTENSION CONTROL Vitals:   09/10/23 1512 09/10/23 1557  BP: (!) 124/92 (!) 118/90    The patient's blood pressure is elevated above target today.  In order to address the patient's elevated BP: Blood pressure will be monitored at home to determine if medication changes need to be made.          Physical Exam:   VS:  BP (!) 118/90   Pulse 78   Ht 5\' 4"  (1.626 m)   Wt 109 lb 8 oz (49.7 kg)   SpO2 96%   BMI 18.80 kg/m    Wt Readings from Last 3 Encounters:  09/10/23 109 lb 8 oz (49.7 kg)  08/30/23 110 lb (49.9 kg)  08-03-23 120 lb (54.4 kg)    Constitutional:      Appearance: Healthy appearance. Not in distress.  Pulmonary:     Breath sounds: Normal breath sounds. No wheezing. No rales.  Cardiovascular:     Normal rate. Regular rhythm.     Murmurs: There  is no murmur.  Edema:    Peripheral edema absent.        Assessment and Plan:   Assessment & Plan Heart failure with improved ejection fraction (HFimpEF) (HCC) Secondary to non-ischemic cardiomyopathy. Initial ejection fraction was 35-40% in April 2024, improved to 50-55% by Limited echo in October 2024.  His initial diagnosis was in the setting of pneumonia and sepsis.  Question if his cardiomyopathy was related to this.  He was recently seen for increase in weight and lower extremity swelling.  This has resolved. BNP was normal at last visit - Continue Furosemide 40 mg oral daily, K+ 20 mEq daily - Continue Spironolactone 12.5 mg oral daily - Continue Metoprolol succinate 50 mg oral daily - Follow-up in November as planned Elevated blood pressure reading Diastolic blood pressure elevated.   This is likely due to recent salty meal.  Continue home blood pressure monitoring. Consider increasing Losartan to 25 mg daily if elevated blood pressure persists. Dilated aortic root (HCC) Dilated aortic root measuring 41 mm on October 2024 echocardiogram.  Continue with annual echocardiograms for surveillance      Dispo:  Return in about 8 months (around 05/12/2024) for Routine Follow Up, w/ Dr. Mayford Knife, or Tereso Newcomer, PA-C.  Signed, Tereso Newcomer, PA-C

## 2023-09-10 NOTE — Assessment & Plan Note (Signed)
 Secondary to non-ischemic cardiomyopathy. Initial ejection fraction was 35-40% in April 2024, improved to 50-55% by Limited echo in October 2024.  His initial diagnosis was in the setting of pneumonia and sepsis.  Question if his cardiomyopathy was related to this.  He was recently seen for increase in weight and lower extremity swelling.  This has resolved. BNP was normal at last visit - Continue Furosemide 40 mg oral daily, K+ 20 mEq daily - Continue Spironolactone 12.5 mg oral daily - Continue Metoprolol succinate 50 mg oral daily - Follow-up in November as planned

## 2023-09-10 NOTE — Assessment & Plan Note (Signed)
 Dilated aortic root measuring 41 mm on October 2024 echocardiogram.  Continue with annual echocardiograms for surveillance

## 2023-11-05 ENCOUNTER — Telehealth: Payer: Self-pay | Admitting: Physician Assistant

## 2023-11-05 MED ORDER — FUROSEMIDE 40 MG PO TABS: 40.0000 mg | ORAL_TABLET | Freq: Every day | ORAL | 4 refills | Status: DC

## 2023-11-05 MED ORDER — FUROSEMIDE 40 MG PO TABS
40.0000 mg | ORAL_TABLET | Freq: Every day | ORAL | 3 refills | Status: DC
  Filled 2024-07-02: qty 120, 60d supply, fill #0

## 2023-11-05 NOTE — Telephone Encounter (Signed)
 Spoke with pt's dad Geraldean Klein per DPR. Updated lasix  prescription to say 40 mg once daily and additional dose PRN at bedtime, sent new prescription. Bryan verbalizes understanding.

## 2023-11-05 NOTE — Telephone Encounter (Signed)
 Spoke with pt's dad per DPR. Pt was short of breath yesterday which caused pt's dad to look at his legs and they were swollen. Pt's dad gave pt an extra Lasix  dose yesterday afternoon. O2 was 91% yesterday morning and 2500 mL in total with in and out cath yesterday, shortness of breath went away after extra Lasix  dose. O2 last night was 97%. Usually takes Lasix  40 mg once daily AM. Pt's dad wants to know if we can update the prescription to say 40 mg once daily and additional dose PRN at bedtime and send in a new prescription. Explained that we will send this to Dr. Micael Adas and Marlyse Single, PA-C to review. Pt's dad verbalized understanding of plan and will wait for a call back to confirm.

## 2023-11-05 NOTE — Telephone Encounter (Signed)
 Pt c/o swelling/edema: STAT if pt has developed SOB within 24 hours  If swelling, where is the swelling located? Legs and feet   How much weight have you gained and in what time span? 3lbs since last Tuesday   Have you gained 2 pounds in a day or 5 pounds in a week? No   Do you have a log of your daily weights (if so, list)? Did not record   Are you currently taking a fluid pill?  Yes   Are you currently SOB?  No   Have you traveled recently in a car or plane for an extended period of time? No

## 2023-11-05 NOTE — Addendum Note (Signed)
 Addended by: Magdalena Skilton L on: 11/05/2023 05:39 PM   Modules accepted: Orders

## 2023-11-05 NOTE — Telephone Encounter (Signed)
 Spoke with pt's father and explained that Dr. Micael Adas was okay with updating the prescription instructions to adding an additional PRN bedtime dose if swelling is present or SOB. Pt's father verbalized understanding of plan and had no further questions at this time.

## 2023-11-13 ENCOUNTER — Other Ambulatory Visit (HOSPITAL_COMMUNITY): Payer: Self-pay | Admitting: Cardiology

## 2023-11-14 MED ORDER — POTASSIUM CHLORIDE ER 20 MEQ PO TBCR
1.0000 | EXTENDED_RELEASE_TABLET | Freq: Every day | ORAL | 3 refills | Status: AC
  Filled 2024-07-02: qty 90, 90d supply, fill #0

## 2023-11-14 MED ORDER — LOSARTAN POTASSIUM 25 MG PO TABS: 12.5000 mg | ORAL_TABLET | Freq: Every day | ORAL | 3 refills | Status: DC

## 2023-11-29 ENCOUNTER — Telehealth: Payer: Self-pay | Admitting: Nurse Practitioner

## 2023-11-29 NOTE — Telephone Encounter (Signed)
 CMN for non-invasive ventilator [employer]

## 2023-12-06 NOTE — Telephone Encounter (Signed)
 Rc'd signed [employer] from for ventilator. Will fax to [phone]. Once confirmation rec'd will send to scan.

## 2024-01-08 ENCOUNTER — Ambulatory Visit (HOSPITAL_COMMUNITY)
Admission: RE | Admit: 2024-01-08 | Discharge: 2024-01-08 | Disposition: A | Discharge status: Home / Self Care | Payer: Medicare Other | Source: Ambulatory Visit | Attending: Cardiology | Admitting: Cardiology

## 2024-01-08 ENCOUNTER — Encounter: Payer: Self-pay | Admitting: Cardiology

## 2024-01-08 ENCOUNTER — Ambulatory Visit: Payer: Self-pay | Admitting: Cardiology

## 2024-01-08 DIAGNOSIS — Q2381 Bicuspid aortic valve: Secondary | ICD-10-CM | POA: Insufficient documentation

## 2024-01-08 DIAGNOSIS — I420 Dilated cardiomyopathy: Secondary | ICD-10-CM

## 2024-01-08 DIAGNOSIS — I77810 Thoracic aortic ectasia: Secondary | ICD-10-CM | POA: Insufficient documentation

## 2024-01-08 DIAGNOSIS — I5032 Chronic diastolic (congestive) heart failure: Secondary | ICD-10-CM

## 2024-01-08 LAB — ECHOCARDIOGRAM COMPLETE
AR max vel: 2.12 cm2
AV Area VTI: 2.58 cm2
AV Area mean vel: 2.2 cm2
AV Mean grad: 2 mmHg
AV Peak grad: 3.7 mmHg
Ao pk vel: 0.96 m/s
Area-P 1/2: 2.99 cm2
P 1/2 time: 786 ms
S' Lateral: 2.7 cm

## 2024-01-09 ENCOUNTER — Telehealth: Payer: Self-pay | Admitting: Cardiology

## 2024-01-09 MED ORDER — SPIRONOLACTONE 25 MG PO TABS
12.5000 mg | ORAL_TABLET | Freq: Every day | ORAL | 2 refills | Status: AC
  Filled 2024-07-02: qty 45, 90d supply, fill #0

## 2024-01-09 NOTE — Telephone Encounter (Signed)
*  STAT* If patient is at the pharmacy, call can be transferred to refill team.   1. Which medications need to be refilled? (please list name of each medication and dose if known)   spironolactone  (ALDACTONE ) 25 MG tablet (Expired)   2. Would you like to learn more about the convenience, safety, & potential cost savings by using the [facility]?   3. Are you open to using the [facility] (Type [facility]. ).  4. Which pharmacy/location (including street and city if local pharmacy) is medication to be sent to?  [id number] - [place], [place] - [address] AT [facility] OF [address]   5. Do they need a 30 day or 90 day supply?   90 day  Father Rodman) stated patient has 8 days worth of medication left.

## 2024-01-09 NOTE — Telephone Encounter (Signed)
 Pt's medication was sent to pt's pharmacy as requested. Confirmation received.

## 2024-01-24 ENCOUNTER — Telehealth: Payer: Self-pay | Admitting: Cardiology

## 2024-01-24 MED ORDER — LOSARTAN POTASSIUM 25 MG PO TABS: 12.5000 mg | ORAL_TABLET | Freq: Every day | ORAL | 3 refills | Status: AC

## 2024-01-24 NOTE — Telephone Encounter (Signed)
 Cozaar  25 mg was sent to pts pharmacy as requested.

## 2024-01-24 NOTE — Telephone Encounter (Signed)
*  STAT* If patient is at the pharmacy, call can be transferred to refill team.   1. Which medications need to be refilled? (please list name of each medication and dose if known)   losartan  (COZAAR ) 25 MG tablet   2. Would you like to learn more about the convenience, safety, & potential cost savings by using the [facility]?   3. Are you open to using the [facility] (Type [facility]. ).  4. Which pharmacy/location (including street and city if local pharmacy) is medication to be sent to?  [id number] - [place], [place] - [address] AT [facility] [address]   5. Do they need a 30 day or 90 day supply?   90 day  Father Rodman) stated patient still has some medication.

## 2024-01-25 NOTE — Telephone Encounter (Signed)
 Spoke with patient and father (DPR), advised that  Echo showed low normal LV function EF 50 to 55% with bicuspid aortic valve and mild leakiness of the aortic valve.  The aortic root is mildly enlarged at 40 mm.  Please repeat 2D echo in 1 year.    Compared to study 04/18/2023 no significant change  Patient and father verbalized understanding. Echo order placed for next year.

## 2024-01-25 NOTE — Telephone Encounter (Signed)
-----   Message from Samuel Becker sent at 01/08/2024  4:44 PM EDT ----- Echo showed low normal LV function EF 50 to 55% with bicuspid aortic valve and mild leakiness of the aortic valve.  The aortic root is mildly enlarged at 40 mm.  Please repeat 2D echo in 1 year.   Compared to study 04/18/2023 no significant change ----- Message ----- From: Interface, Three One Seven Sent: 01/08/2024  12:57 PM EDT To: Samuel JONELLE Bihari, MD

## 2024-01-27 ENCOUNTER — Emergency Department (HOSPITAL_BASED_OUTPATIENT_CLINIC_OR_DEPARTMENT_OTHER)

## 2024-01-27 ENCOUNTER — Other Ambulatory Visit: Payer: Self-pay

## 2024-01-27 ENCOUNTER — Inpatient Hospital Stay (HOSPITAL_BASED_OUTPATIENT_CLINIC_OR_DEPARTMENT_OTHER)
Admission: EM | Admit: 2024-01-27 | Discharge: 2024-02-01 | DRG: 871 | Disposition: A | Discharge status: Home / Self Care | Attending: Internal Medicine | Admitting: Internal Medicine

## 2024-01-27 ENCOUNTER — Encounter (HOSPITAL_BASED_OUTPATIENT_CLINIC_OR_DEPARTMENT_OTHER): Payer: Self-pay

## 2024-01-27 DIAGNOSIS — Z91040 Latex allergy status: Secondary | ICD-10-CM

## 2024-01-27 DIAGNOSIS — Q051 Thoracic spina bifida with hydrocephalus: Secondary | ICD-10-CM | POA: Diagnosis present

## 2024-01-27 DIAGNOSIS — Z888 Allergy status to other drugs, medicaments and biological substances status: Secondary | ICD-10-CM

## 2024-01-27 DIAGNOSIS — N319 Neuromuscular dysfunction of bladder, unspecified: Secondary | ICD-10-CM | POA: Diagnosis present

## 2024-01-27 DIAGNOSIS — R12 Heartburn: Secondary | ICD-10-CM | POA: Diagnosis not present

## 2024-01-27 DIAGNOSIS — I110 Hypertensive heart disease with heart failure: Secondary | ICD-10-CM | POA: Diagnosis present

## 2024-01-27 DIAGNOSIS — R6521 Severe sepsis with septic shock: Secondary | ICD-10-CM | POA: Diagnosis present

## 2024-01-27 DIAGNOSIS — A419 Sepsis, unspecified organism: Principal | ICD-10-CM | POA: Diagnosis present

## 2024-01-27 DIAGNOSIS — I420 Dilated cardiomyopathy: Secondary | ICD-10-CM | POA: Diagnosis present

## 2024-01-27 DIAGNOSIS — Z87440 Personal history of urinary (tract) infections: Secondary | ICD-10-CM

## 2024-01-27 DIAGNOSIS — I5042 Chronic combined systolic (congestive) and diastolic (congestive) heart failure: Secondary | ICD-10-CM | POA: Diagnosis present

## 2024-01-27 DIAGNOSIS — Z8249 Family history of ischemic heart disease and other diseases of the circulatory system: Secondary | ICD-10-CM

## 2024-01-27 DIAGNOSIS — Z1152 Encounter for screening for COVID-19: Secondary | ICD-10-CM

## 2024-01-27 DIAGNOSIS — R6520 Severe sepsis without septic shock: Principal | ICD-10-CM

## 2024-01-27 DIAGNOSIS — Z885 Allergy status to narcotic agent status: Secondary | ICD-10-CM

## 2024-01-27 DIAGNOSIS — E876 Hypokalemia: Secondary | ICD-10-CM | POA: Diagnosis not present

## 2024-01-27 DIAGNOSIS — G911 Obstructive hydrocephalus: Secondary | ICD-10-CM | POA: Diagnosis present

## 2024-01-27 DIAGNOSIS — Z882 Allergy status to sulfonamides status: Secondary | ICD-10-CM

## 2024-01-27 DIAGNOSIS — E871 Hypo-osmolality and hyponatremia: Secondary | ICD-10-CM | POA: Diagnosis present

## 2024-01-27 DIAGNOSIS — Q2381 Bicuspid aortic valve: Secondary | ICD-10-CM

## 2024-01-27 DIAGNOSIS — J189 Pneumonia, unspecified organism: Secondary | ICD-10-CM | POA: Diagnosis present

## 2024-01-27 DIAGNOSIS — M4120 Other idiopathic scoliosis, site unspecified: Secondary | ICD-10-CM | POA: Diagnosis present

## 2024-01-27 DIAGNOSIS — I5032 Chronic diastolic (congestive) heart failure: Secondary | ICD-10-CM | POA: Diagnosis present

## 2024-01-27 DIAGNOSIS — G8220 Paraplegia, unspecified: Secondary | ICD-10-CM | POA: Diagnosis present

## 2024-01-27 DIAGNOSIS — Z79899 Other long term (current) drug therapy: Secondary | ICD-10-CM

## 2024-01-27 DIAGNOSIS — Z993 Dependence on wheelchair: Secondary | ICD-10-CM

## 2024-01-27 DIAGNOSIS — N3000 Acute cystitis without hematuria: Secondary | ICD-10-CM

## 2024-01-27 LAB — CBC WITH DIFFERENTIAL/PLATELET
Abs Immature Granulocytes: 0.17 K/uL — ABNORMAL HIGH (ref 0.00–0.07)
Basophils Absolute: 0.1 K/uL (ref 0.0–0.1)
Basophils Relative: 0 %
Eosinophils Absolute: 0 K/uL (ref 0.0–0.5)
Eosinophils Relative: 0 %
HCT: 39.8 % (ref 39.0–52.0)
Hemoglobin: 13.6 g/dL (ref 13.0–17.0)
Immature Granulocytes: 1 %
Lymphocytes Relative: 4 %
Lymphs Abs: 0.7 K/uL (ref 0.7–4.0)
MCH: 30.2 pg (ref 26.0–34.0)
MCHC: 34.2 g/dL (ref 30.0–36.0)
MCV: 88.2 fL (ref 80.0–100.0)
Monocytes Absolute: 1.2 K/uL — ABNORMAL HIGH (ref 0.1–1.0)
Monocytes Relative: 7 %
Neutro Abs: 14.9 K/uL — ABNORMAL HIGH (ref 1.7–7.7)
Neutrophils Relative %: 88 %
Platelets: 207 K/uL (ref 150–400)
RBC: 4.51 MIL/uL (ref 4.22–5.81)
RDW: 12.7 % (ref 11.5–15.5)
WBC: 17.5 K/uL — ABNORMAL HIGH (ref 4.0–10.5)
nRBC: 0 % (ref 0.0–0.2)

## 2024-01-27 LAB — PROTIME-INR
INR: 1.2 (ref 0.8–1.2)
Prothrombin Time: 15.9 s — ABNORMAL HIGH (ref 11.4–15.2)

## 2024-01-27 LAB — LACTIC ACID, PLASMA: Lactic Acid, Venous: 0.6 mmol/L (ref 0.5–1.9)

## 2024-01-27 MED ORDER — METRONIDAZOLE 500 MG/100ML IV SOLN
500.0000 mg | Freq: Once | INTRAVENOUS | Status: AC
  Administered 2024-01-28 (×2): 500 mg via INTRAVENOUS
  Filled 2024-01-27: qty 100

## 2024-01-27 MED ORDER — LACTATED RINGERS IV SOLN: INTRAVENOUS | Status: DC

## 2024-01-27 MED ORDER — VANCOMYCIN HCL IN DEXTROSE 1-5 GM/200ML-% IV SOLN
1000.0000 mg | Freq: Once | INTRAVENOUS | Status: AC
  Administered 2024-01-28 (×2): 1000 mg via INTRAVENOUS
  Filled 2024-01-27: qty 200

## 2024-01-27 MED ORDER — IBUPROFEN 400 MG PO TABS
400.0000 mg | ORAL_TABLET | Freq: Once | ORAL | Status: AC
  Administered 2024-01-27: 400 mg via ORAL
  Filled 2024-01-27: qty 1

## 2024-01-27 MED ORDER — LACTATED RINGERS IV BOLUS (SEPSIS)
1000.0000 mL | Freq: Once | INTRAVENOUS | Status: AC
  Administered 2024-01-28 (×2): 1000 mL via INTRAVENOUS

## 2024-01-27 MED ORDER — SODIUM CHLORIDE 0.9 % IV SOLN
2.0000 g | Freq: Once | INTRAVENOUS | Status: AC
  Administered 2024-01-28 (×2): 2 g via INTRAVENOUS
  Filled 2024-01-27: qty 12.5

## 2024-01-27 NOTE — ED Triage Notes (Signed)
 Pt presents via POV c/o fever and weakness starting today. Temp in triage 103.2. Took PO Tylenol  at home per family. HR 154

## 2024-01-28 ENCOUNTER — Emergency Department (HOSPITAL_BASED_OUTPATIENT_CLINIC_OR_DEPARTMENT_OTHER)

## 2024-01-28 DIAGNOSIS — Z8249 Family history of ischemic heart disease and other diseases of the circulatory system: Secondary | ICD-10-CM | POA: Diagnosis not present

## 2024-01-28 DIAGNOSIS — I5042 Chronic combined systolic (congestive) and diastolic (congestive) heart failure: Secondary | ICD-10-CM | POA: Diagnosis present

## 2024-01-28 DIAGNOSIS — Z91040 Latex allergy status: Secondary | ICD-10-CM | POA: Diagnosis not present

## 2024-01-28 DIAGNOSIS — Z79899 Other long term (current) drug therapy: Secondary | ICD-10-CM | POA: Diagnosis not present

## 2024-01-28 DIAGNOSIS — E876 Hypokalemia: Secondary | ICD-10-CM | POA: Diagnosis not present

## 2024-01-28 DIAGNOSIS — I110 Hypertensive heart disease with heart failure: Secondary | ICD-10-CM | POA: Diagnosis present

## 2024-01-28 DIAGNOSIS — Z1152 Encounter for screening for COVID-19: Secondary | ICD-10-CM | POA: Diagnosis not present

## 2024-01-28 DIAGNOSIS — R12 Heartburn: Secondary | ICD-10-CM | POA: Diagnosis not present

## 2024-01-28 DIAGNOSIS — R000 Tachycardia, unspecified: Secondary | ICD-10-CM | POA: Diagnosis not present

## 2024-01-28 DIAGNOSIS — Q051 Thoracic spina bifida with hydrocephalus: Secondary | ICD-10-CM | POA: Diagnosis not present

## 2024-01-28 DIAGNOSIS — Q059 Spina bifida, unspecified: Secondary | ICD-10-CM

## 2024-01-28 DIAGNOSIS — Z882 Allergy status to sulfonamides status: Secondary | ICD-10-CM | POA: Diagnosis not present

## 2024-01-28 DIAGNOSIS — R6521 Severe sepsis with septic shock: Secondary | ICD-10-CM

## 2024-01-28 DIAGNOSIS — G911 Obstructive hydrocephalus: Secondary | ICD-10-CM | POA: Diagnosis present

## 2024-01-28 DIAGNOSIS — M4120 Other idiopathic scoliosis, site unspecified: Secondary | ICD-10-CM | POA: Diagnosis present

## 2024-01-28 DIAGNOSIS — G8220 Paraplegia, unspecified: Secondary | ICD-10-CM | POA: Diagnosis present

## 2024-01-28 DIAGNOSIS — Z885 Allergy status to narcotic agent status: Secondary | ICD-10-CM | POA: Diagnosis not present

## 2024-01-28 DIAGNOSIS — I10 Essential (primary) hypertension: Secondary | ICD-10-CM

## 2024-01-28 DIAGNOSIS — Z87440 Personal history of urinary (tract) infections: Secondary | ICD-10-CM | POA: Diagnosis not present

## 2024-01-28 DIAGNOSIS — Z888 Allergy status to other drugs, medicaments and biological substances status: Secondary | ICD-10-CM | POA: Diagnosis not present

## 2024-01-28 DIAGNOSIS — A419 Sepsis, unspecified organism: Secondary | ICD-10-CM | POA: Diagnosis not present

## 2024-01-28 DIAGNOSIS — J189 Pneumonia, unspecified organism: Secondary | ICD-10-CM | POA: Diagnosis present

## 2024-01-28 DIAGNOSIS — N319 Neuromuscular dysfunction of bladder, unspecified: Secondary | ICD-10-CM | POA: Diagnosis present

## 2024-01-28 DIAGNOSIS — Z993 Dependence on wheelchair: Secondary | ICD-10-CM | POA: Diagnosis not present

## 2024-01-28 DIAGNOSIS — I420 Dilated cardiomyopathy: Secondary | ICD-10-CM | POA: Diagnosis present

## 2024-01-28 DIAGNOSIS — E871 Hypo-osmolality and hyponatremia: Secondary | ICD-10-CM | POA: Diagnosis present

## 2024-01-28 DIAGNOSIS — Q2381 Bicuspid aortic valve: Secondary | ICD-10-CM | POA: Diagnosis not present

## 2024-01-28 LAB — COMPREHENSIVE METABOLIC PANEL WITH GFR
ALT: 11 U/L (ref 0–44)
AST: 24 U/L (ref 15–41)
Albumin: 4.3 g/dL (ref 3.5–5.0)
Alkaline Phosphatase: 64 U/L (ref 38–126)
Anion gap: 17 — ABNORMAL HIGH (ref 5–15)
BUN: 12 mg/dL (ref 6–20)
CO2: 18 mmol/L — ABNORMAL LOW (ref 22–32)
Calcium: 10.1 mg/dL (ref 8.9–10.3)
Chloride: 94 mmol/L — ABNORMAL LOW (ref 98–111)
Creatinine, Ser: 0.71 mg/dL (ref 0.61–1.24)
GFR, Estimated: >60 mL/min (ref >60)
Glucose, Bld: 89 mg/dL (ref 70–99)
Potassium: 4.2 mmol/L (ref 3.5–5.1)
Sodium: 130 mmol/L — ABNORMAL LOW (ref 135–145)
Total Bilirubin: 0.8 mg/dL (ref 0.0–1.2)
Total Protein: 7.1 g/dL (ref 6.5–8.1)

## 2024-01-28 LAB — BASIC METABOLIC PANEL WITH GFR
Anion gap: 10 (ref 5–15)
BUN: 7 mg/dL (ref 6–20)
CO2: 24 mmol/L (ref 22–32)
Calcium: 8.8 mg/dL — ABNORMAL LOW (ref 8.9–10.3)
Chloride: 102 mmol/L (ref 98–111)
Creatinine, Ser: 0.6 mg/dL — ABNORMAL LOW (ref 0.61–1.24)
GFR, Estimated: >60 mL/min (ref >60)
Glucose, Bld: 138 mg/dL — ABNORMAL HIGH (ref 70–99)
Potassium: 4.4 mmol/L (ref 3.5–5.1)
Sodium: 136 mmol/L (ref 135–145)

## 2024-01-28 LAB — URINALYSIS, W/ REFLEX TO CULTURE (INFECTION SUSPECTED)
Bacteria, UA: NONE SEEN
Bilirubin Urine: NEGATIVE
Glucose, UA: NEGATIVE mg/dL
Hgb urine dipstick: NEGATIVE
Ketones, ur: 40 mg/dL — AB
Nitrite: NEGATIVE
Protein, ur: NEGATIVE mg/dL
Specific Gravity, Urine: 1.006 (ref 1.005–1.030)
pH: 6 (ref 5.0–8.0)

## 2024-01-28 LAB — RESP PANEL BY RT-PCR (RSV, FLU A&B, COVID)  RVPGX2
Influenza A by PCR: NEGATIVE
Influenza B by PCR: NEGATIVE
Resp Syncytial Virus by PCR: NEGATIVE
SARS Coronavirus 2 by RT PCR: NEGATIVE

## 2024-01-28 LAB — HIV ANTIBODY (ROUTINE TESTING W REFLEX): HIV Screen 4th Generation wRfx: NONREACTIVE

## 2024-01-28 LAB — LACTIC ACID, PLASMA: Lactic Acid, Venous: 1.3 mmol/L (ref 0.5–1.9)

## 2024-01-28 LAB — MRSA NEXT GEN BY PCR, NASAL: MRSA by PCR Next Gen: NOT DETECTED

## 2024-01-28 MED ORDER — SODIUM CHLORIDE 0.9 % IV SOLN
2.0000 g | INTRAVENOUS | Status: DC
  Administered 2024-01-28 (×2): 2 g via INTRAVENOUS
  Filled 2024-01-28: qty 20

## 2024-01-28 MED ORDER — ORAL CARE MOUTH RINSE: 15.0000 mL | OROMUCOSAL | Status: DC | PRN

## 2024-01-28 MED ORDER — DOCUSATE SODIUM 100 MG PO CAPS
100.0000 mg | ORAL_CAPSULE | Freq: Two times a day (BID) | ORAL | Status: DC | PRN
  Administered 2024-01-31 – 2024-02-01 (×3): 100 mg via ORAL
  Filled 2024-01-28 (×3): qty 1

## 2024-01-28 MED ORDER — NOREPINEPHRINE 4 MG/250ML-% IV SOLN
0.0000 ug/min | INTRAVENOUS | Status: DC
  Administered 2024-01-28 (×2): 3 ug/min via INTRAVENOUS
  Administered 2024-01-28 (×2): 2 ug/min via INTRAVENOUS
  Filled 2024-01-28: qty 250

## 2024-01-28 MED ORDER — LACTATED RINGERS IV BOLUS
1000.0000 mL | Freq: Once | INTRAVENOUS | Status: AC
  Administered 2024-01-28 (×2): 1000 mL via INTRAVENOUS

## 2024-01-28 MED ORDER — METOPROLOL SUCCINATE ER 50 MG PO TB24
50.0000 mg | ORAL_TABLET | Freq: Every day | ORAL | Status: DC
  Administered 2024-01-28 – 2024-02-01 (×8): 50 mg via ORAL
  Filled 2024-01-28 (×5): qty 1

## 2024-01-28 MED ORDER — OXYBUTYNIN CHLORIDE ER 5 MG PO TB24
15.0000 mg | ORAL_TABLET | Freq: Every day | ORAL | Status: DC
  Administered 2024-01-28 – 2024-01-31 (×7): 15 mg via ORAL
  Filled 2024-01-28 (×5): qty 1

## 2024-01-28 MED ORDER — SODIUM CHLORIDE 0.9 % IV SOLN: 250.0000 mL | INTRAVENOUS | Status: AC

## 2024-01-28 MED ORDER — POLYETHYLENE GLYCOL 3350 17 G PO PACK
17.0000 g | PACK | Freq: Every day | ORAL | Status: DC | PRN
  Administered 2024-02-01: 17 g via ORAL
  Filled 2024-01-28: qty 1

## 2024-01-28 MED ORDER — HEPARIN SODIUM (PORCINE) 5000 UNIT/ML IJ SOLN
5000.0000 [IU] | Freq: Three times a day (TID) | INTRAMUSCULAR | Status: DC
  Administered 2024-01-28 – 2024-02-01 (×20): 5000 [IU] via SUBCUTANEOUS
  Filled 2024-01-28 (×12): qty 1

## 2024-01-28 MED ORDER — ACETAMINOPHEN 325 MG PO TABS
650.0000 mg | ORAL_TABLET | Freq: Four times a day (QID) | ORAL | Status: DC | PRN
  Administered 2024-01-28 – 2024-01-31 (×13): 650 mg via ORAL
  Filled 2024-01-28 (×7): qty 2

## 2024-01-28 MED ORDER — IOHEXOL 300 MG/ML  SOLN
80.0000 mL | Freq: Once | INTRAMUSCULAR | Status: AC | PRN
  Administered 2024-01-28 (×2): 80 mL via INTRAVENOUS

## 2024-01-28 MED ORDER — SODIUM CHLORIDE 0.9 % IV SOLN
2.0000 g | Freq: Three times a day (TID) | INTRAVENOUS | Status: DC
  Administered 2024-01-28 (×2): 2 g via INTRAVENOUS
  Filled 2024-01-28: qty 12.5

## 2024-01-28 MED ORDER — CHLORHEXIDINE GLUCONATE CLOTH 2 % EX PADS
6.0000 | MEDICATED_PAD | Freq: Every day | CUTANEOUS | Status: DC
  Administered 2024-01-28 – 2024-02-01 (×8): 6 via TOPICAL

## 2024-01-28 NOTE — Progress Notes (Signed)
 Pt being followed by [facility] for Sepsis protocol.

## 2024-01-28 NOTE — ED Notes (Signed)
 Unable to obtain second blood culture d/t pt acuity and poor vasculature. Abx started

## 2024-01-28 NOTE — H&P (Signed)
 NAME:  Samuel Becker, MRN:  [id number], DOB:  02-01-1991, LOS: 0 ADMISSION DATE:  01/27/2024, CONSULTATION DATE:  01/28/24 REFERRING MD:  EDP, CHIEF COMPLAINT:  sepsis   History of Present Illness:  Samuel Becker is a 33 y.o. M with PMH significant for spina bifida, paraplegia, bicuspid aortic valve, HF with last EF 50-55% and neurogenic bladder and self catheterizes who presented to [facility] ED with chills, weakness and fever to 103.62F at home.  Did get his testicles pinched in transferring from bed to wheelchair yesterday without apparent injury, not sure if contributed but symptoms progressed afterwards Reports normally self caths ~7 times a day but due to progressive urgency, was up to 17 times.  No dysuria or abd pain.  Did take his lasix  yesterday morning.  Had slight headache since resolved, otherwise denied URI, cough, SOB, N/V/D or any other symptoms. Labs were significant for WBC of 17k without a lactic acidosis and normal renal function.  UA showed leukocytes with smell per pt, despite 3L IVF he remained hypotensive and was started on peripheral levophed  and transferred to [facility].  CT a/p w/ contrast did not show any evidence of intra-abd pathology.    Pertinent  Medical History   has a past medical history of Ascending aorta dilatation (HCC), Bicuspid aortic valve, HFrEF (heart failure with reduced ejection fraction) (HCC) (10/02/2022), and Spina bifida (HCC).   Significant Hospital Events: Including procedures, antibiotic start and stop dates in addition to other pertinent events   8/11 admit to [facility]  Interim History / Subjective:  Arrived to [facility] on Levophed  , since weaned off.  No current complaints other than urinary frequency s/p foley placement.  Eating and drinking ok.   Afebrile Mom and stepmother at bedside  Objective    Blood pressure 100/70, pulse (!) 105, temperature 98.6 F (37 C), temperature source Oral, resp. rate 19, height 5' 4  (1.626 m), weight 54.7 kg, SpO2 96%.        Intake/Output Summary (Last 24 hours) at 01/28/2024 0648 Last data filed at 01/28/2024 9361 Gross per 24 hour  Intake 1953.53 ml  Output --  Net 1953.53 ml   Filed Weights   01/27/24 2327 01/28/24 0621  Weight: 49.9 kg 54.7 kg   General:  well nourished M alert and oriented HEENT: MM pink/moist, pupils 4/r Neuro: alert and oriented, paraplegic, moving upper extremities CV: s1s2 rrr, no m/r/g PULM:  clear bilaterally on RA  GI: soft, bsx4 active, +bowel movement Extremities: cool/dry, trace LE   afebrile  Resolved problem list   Assessment and Plan   Septic shock- suspected urinary  - currently normotensive off pressors.  PRN peripheral NE for MAP goal > 65.  If BP remains normotensive, can transfer out of ICU later today - follow cultures - given hemodynamic improvement, de-escalate abx to ctx, previous pan-sensitive e. Coli uti in 2022 - trend WBC/ fever curve - follow UC  - KVO MIVF> tolerating diet - tylenol  prn    Spina bifida with paraplegia  Neurogenic bladder - foley placed given urinary frequency> likely d/c 8/12 - restart home ditropan  - follow UOP/ renal indices  - cont supportive care/ bowel regimen prn   HTN Bicuspid aortic valve - EF 50-55 ( 12/2023) - hold pta lasix , spiro, metoprolol , losartan    Best Practice (right click and Reselect all SmartList Selections daily)   Diet/type: Regular consistency (see orders) DVT prophylaxis prophylactic heparin   Pressure ulcer(s): N/A GI prophylaxis: N/A Lines: N/A Foley:  Yes, and it is still needed Code Status:  full code Last date of multidisciplinary goals of care discussion [8/11]  Pt updated on plan of care.  Mother and step-mother, both heavily involved in pts care remain at bedside.   Labs   CBC: Recent Labs  Lab 01/27/24 2319  WBC 17.5*  NEUTROABS 14.9*  HGB 13.6  HCT 39.8  MCV 88.2  PLT 207    Basic Metabolic Panel: Recent Labs  Lab  01/27/24 2319  NA 130*  K 4.2  CL 94*  CO2 18*  GLUCOSE 89  BUN 12  CREATININE 0.71  CALCIUM  10.1   GFR: Estimated Creatinine Clearance: 100.7 mL/min (by C-G formula based on SCr of 0.71 mg/dL). Recent Labs  Lab 01/27/24 2319 01/28/24 0114  WBC 17.5*  --   LATICACIDVEN 0.6 1.3    Liver Function Tests: Recent Labs  Lab 01/27/24 2319  AST 24  ALT 11  ALKPHOS 64  BILITOT 0.8  PROT 7.1  ALBUMIN 4.3   No results for input(s): LIPASE, AMYLASE in the last 168 hours. No results for input(s): AMMONIA in the last 168 hours.  ABG    Component Value Date/Time   PHART 7.46 (H) 11/30/2022 1108   PCO2ART 45 11/30/2022 1108   PO2ART 79 (L) 11/30/2022 1108   HCO3 32.0 (H) 11/30/2022 1108   TCO2 30 09/20/2022 2031   ACIDBASEDEF 1.0 09/20/2022 2031   O2SAT 97.8 11/30/2022 1108     Coagulation Profile: Recent Labs  Lab 01/27/24 2319  INR 1.2    Cardiac Enzymes: No results for input(s): CKTOTAL, CKMB, CKMBINDEX, TROPONINI in the last 168 hours.  HbA1C: No results found for: HGBA1C  CBG: No results for input(s): GLUCAP in the last 168 hours.  Review of Systems:   Review of Systems  Constitutional:  Positive for chills, fever and malaise/fatigue.  HENT:  Negative for sore throat.   Respiratory:  Negative for cough, sputum production and shortness of breath.   Cardiovascular:  Negative for chest pain, orthopnea and leg swelling.  Gastrointestinal:  Negative for abdominal pain, constipation, diarrhea, nausea and vomiting.  Genitourinary:  Positive for frequency and urgency. Negative for dysuria, flank pain and hematuria.  Neurological:  Positive for weakness and headaches. Negative for focal weakness.     Past Medical History:  He,  has a past medical history of Ascending aorta dilatation (HCC), Bicuspid aortic valve, HFrEF (heart failure with reduced ejection fraction) (HCC) (10/02/2022), and Spina bifida (HCC).   Surgical History:  History  reviewed. No pertinent surgical history.   Social History:   reports that he has never smoked. He has never used smokeless tobacco. He reports that he does not currently use alcohol . He reports that he does not use drugs.   Family History:  His family history includes Heart attack in his maternal grandfather and paternal grandmother; Heart attack (age of onset: 24) in his paternal grandfather; Heart disease in his maternal grandmother; Hypertension in his father.   Allergies Allergies  Allergen Reactions   Morphine And Codeine Other (See Comments)    Hallucinations    Septra [Sulfamethoxazole-Trimethoprim] Other (See Comments)    Gi upset    Sulfamethoxazole Other (See Comments)    Gi upset    Trimethoprim Other (See Comments)    Unknown    Latex Swelling and Rash     Home Medications  Prior to Admission medications   Medication Sig Start Date End Date Taking? Authorizing Provider  acetaminophen  (TYLENOL ) 325 MG tablet  Take 325 mg by mouth as needed for moderate pain.    [provider]  calcium  carbonate (SUPER CALCIUM ) 1500 (600 Ca) MG TABS tablet Take 600 mg of elemental calcium  by mouth daily with breakfast.    [provider]  cetirizine (ZYRTEC) 10 MG tablet Take 10 mg by mouth daily.    [provider]  furosemide  (LASIX ) 40 MG tablet Take 1 tablet (40 mg total) by mouth daily. You may take an additional 40 mg dose for leg swelling or shortness of breath in the evening. 11/05/23 02/03/24  Shlomo Wilbert SAUNDERS, MD  losartan  (COZAAR ) 25 MG tablet Take 0.5 tablets (12.5 mg total) by mouth daily. 01/24/24 04/23/24  Shlomo Wilbert SAUNDERS, MD  metoprolol  succinate (TOPROL  XL) 50 MG 24 hr tablet Take 1 tablet (50 mg total) by mouth daily. 08/22/23   Ladona Heinz, MD  Multiple Vitamins-Minerals (MULTIVITAMIN WITH MINERALS) tablet Take 1 tablet by mouth daily.    [provider]  oxybutynin  (DITROPAN  XL) 15 MG 24 hr tablet Take 15 mg by mouth at bedtime.    [provider]  Potassium Chloride  ER 20 MEQ TBCR Take 1 tablet (20 mEq total) by mouth daily. 11/14/23   Lelon Hamilton T, PA-C  spironolactone  (ALDACTONE ) 25 MG tablet Take 0.5 tablets (12.5 mg total) by mouth daily. 01/09/24   Lelon Hamilton T, PA-C  VITAMIN D PO Take 1 capsule by mouth daily.    [provider]     Critical care time: 45 mins        Lyle Pesa, MSN, AG-ACNP-BC [facility] 01/28/2024, 9:20 AM  See Amion for pager If no response to pager , please call [phone] until 7pm After 7:00 pm call Elink  [phone]

## 2024-01-28 NOTE — TOC Initial Note (Signed)
 Transition of Care [facility]) - Initial/Assessment Note    Patient Details  Name: Samuel Becker MRN: [id number] Date of Birth: April 17, 1991  Transition of Care [facility]) CM/SW Contact:    Justina Delcia Czar, RN Phone Number: [phone] 01/28/2024, 1:20 PM  Clinical Narrative:                 Spoke to pt, mother and stepmother and states he is independent pta. Has wheelchair, and stander to assist him. Pt is wheelchair bound. Mother will provide transportation home.   Will continue to follow for dc needs.   Expected Discharge Plan: Home/Self Care Barriers to Discharge: Continued Medical Work up   Patient Goals and CMS Choice Patient states their goals for this hospitalization and ongoing recovery are:: wants to remain independent          Expected Discharge Plan and Services   Discharge Planning Services: CM Consult   Living arrangements for the past 2 months: Single Family Home                                      Prior Living Arrangements/Services Living arrangements for the past 2 months: Single Family Home Lives with:: Parents Patient language and need for interpreter reviewed:: Yes Do you feel safe going back to the place where you live?: Yes      Need for Family Participation in Patient Care: No (Comment) Care giver support system in place?: Yes (comment) Current home services: Homehealth aide, DME (wheelchair, ramp, stander) Criminal Activity/Legal Involvement Pertinent to Current Situation/Hospitalization: No - Comment as needed  Activities of Daily Living   ADL Screening (condition at time of admission) Independently performs ADLs?: No Does the patient have a NEW difficulty with bathing/dressing/toileting/self-feeding that is expected to last >3 days?: No Does the patient have a NEW difficulty with getting in/out of bed, walking, or climbing stairs that is expected to last >3 days?: No Does the patient have a NEW difficulty with communication that is  expected to last >3 days?: No Is the patient deaf or have difficulty hearing?: No Does the patient have difficulty seeing, even when wearing glasses/contacts?: No Does the patient have difficulty concentrating, remembering, or making decisions?: No  Permission Sought/Granted Permission sought to share information with : Case Manager, Family Supports, PCP Permission granted to share information with : Yes, Verbal Permission Granted  Share Information with NAME: Jakin Pavao  Permission granted to share info w AGENCY: Home Health, PCP, DME, PCP  Permission granted to share info w Relationship: mother, stepmother  Permission granted to share info w Contact Information: [phone]  Emotional Assessment Appearance:: Appears stated age Attitude/Demeanor/Rapport: Engaged Affect (typically observed): Accepting Orientation: : Oriented to Self, Oriented to Place, Oriented to  Time, Oriented to Situation   Psych Involvement: No (comment)  Admission diagnosis:  Acute cystitis without hematuria [N30.00] Septic shock (HCC) [A41.9, R65.21] Sepsis with acute organ dysfunction without septic shock, due to unspecified organism, unspecified organ dysfunction type (HCC) [A41.9, R65.20] Patient Active Problem List   Diagnosis Date Noted   Septic shock (HCC) 01/28/2024   Esotropia 03/15/2023   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   Tethered cord (HCC) 03/15/2023   Wheelchair dependence 03/15/2023   Hoarseness 01/10/2023   Hypokalemia 10/05/2022   Bicuspid aortic valve 10/04/2022   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   Pressure injury of skin 10/03/2022  Dilated cardiomyopathy (HCC) 10/03/2022   Heart failure with improved ejection fraction (HFimpEF) (HCC) 10/02/2022   Elevated troponin 10/02/2022   Spina bifida (HCC) 10/02/2022   Paraplegia (HCC) 10/02/2022   Kidney stone on left side 02/18/2019   History of strabismus surgery 01/06/2015   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/20/2014   Cavus foot, acquired 05/10/2012   Hydrocephalus (HCC) 04/25/2012   Allergic rhinitis 10/25/2009   Idiopathic scoliosis and kyphoscoliosis 10/25/2009   Neurogenic bladder 10/25/2009   Neurogenic bowel 10/25/2009   Obstructive hydrocephalus (HCC) 10/25/2009   Spina bifida of thoracic region with hydrocephalus (HCC) 10/25/2009   PCP:  Shlomo Wilbert SAUNDERS, MD Pharmacy:   [facility] [phone] [place], [place] - [address] AT [facility] [address] [facility] [address] [place] [address] Phone: [phone] Fax: [phone]     Social Drivers of Health (SDOH) Social History: SDOH Screenings   Food Insecurity: No Food Insecurity (01/28/2024)  Housing: Low Risk  (01/28/2024)  Transportation Needs: No Transportation Needs (01/28/2024)  Utilities: Not At Risk (01/28/2024)  Alcohol  Screen: Low Risk  (10/06/2022)  Financial Resource Strain: Low Risk  (10/06/2022)  Physical Activity: Insufficiently Active (09/01/2021)   Received from [facility]  Social Connections: Unknown (09/21/2022)   Received from [facility]  Stress: No Stress Concern Present (09/01/2021)   Received from [facility]  Tobacco Use: Low Risk  (01/27/2024)   SDOH Interventions:     Readmission Risk Interventions    10/03/2022    6:02 PM 09/26/2022    9:25 AM 09/21/2022    3:22 PM  Readmission Risk Prevention Plan  Post Dischage Appt Complete Complete Complete  Medication Screening Complete Complete Complete  Transportation Screening Complete Complete Complete

## 2024-01-28 NOTE — ED Notes (Signed)
 [facility] in ED preparing for transfer

## 2024-01-28 NOTE — Progress Notes (Addendum)
 [facility] Physician-Brief Progress Note Patient Name: Samuel Becker DOB: [birth year] MRN: [id number]   Date of Service  01/28/2024  HPI/Events of Note  RN reports tachycardia.  Heart rate in the 140s.  Patient is afebrile, on no fluids and no pressors.  eICU Interventions  Patient's chart reviewed.  He takes 50 mg of metoprolol  XL daily as an outpatient.  He has not been on it since admission.  Will restart it to control heart rate.  Discussed with RN.     Intervention Category Intermediate Interventions: Arrhythmia - evaluation and management  Jerilynn Berg 01/28/2024, 9:33 PM  New problem: Patient with heartburn.  Requesting Tums. Order placed.  Jerilynn Berg 01/29/2024, 5:15 AM

## 2024-01-28 NOTE — ED Notes (Signed)
 3:00am [facility] called and transport to [facility] was requested via [facility]

## 2024-01-28 NOTE — Progress Notes (Signed)
 [facility] Physician-Brief Progress Note Patient Name: DIAR BERKEL DOB: Aug 31, 1990 MRN: [id number]   Date of Service  01/28/2024  HPI/Events of Note  33/M, history of spina bifida (self-catheterization at home), CHF, presents to an outside IF with body aches and fever. Pt describes associated abdominal pain, nausea. Pt was febrile, tachycardic, and hypotensive on exam.  Pt did have leukocytosis of 17.5, lactate 1.3. Respiratory viral panel negative, UA showed 6-10 WBC, no acute intra-abdominal pathology noted on abdominal CT.  Pt was started on empiric antibiotics, fluids. He had later required vasopressor support and was transferred to this facility for ICU level of care.   eICU Interventions  - Admit to ICU - Given empiric cefepime , vancomycin , flagyl  for septic shock of unclear etiology.  - Will follow cultures, adjust therapy as indicated.  - Given 30ml/kg fluid bolus in ED. Would be cautious with further fluid boluses given underlying CHF.  - Titrate levophed  to maintain MAP >65 - Trend WBC, lactate, temperature curve - Will hold home metoprolol , lasix , losartan  spironolactone   for now.         Mikey Maffett M DELA CRUZ 01/28/2024, 6:12 AM

## 2024-01-28 NOTE — ED Notes (Signed)
 Pt moved from triage to room 11. Brandi RN given report. Notified Brandi RN that patient is needing 2nd set of cultures and EKG completed at time of rooming per order.

## 2024-01-28 NOTE — ED Provider Notes (Addendum)
 [facility] EMERGENCY DEPARTMENT AT [facility] Provider Note   CSN: [id number] Arrival date & time: 01/27/24  2253     Patient presents with: Fever   Samuel Becker is a 33 y.o. male.   Patient with a history of spina bifida, self-catheterization at home, heart failure with reduced ejection fraction.  Here with feeling poorly for the past several hours.  Developed body aches, nausea, felt hot with fever up to 103.2.  Did take 1 dose of Tylenol  at home.  Arrives feeling sore and achy all over with nausea and body aches and chills.  Does self catheterize.  Complains of diffuse crampy abdominal pain.  No chest pain or shortness of breath.  Denies any dysuria or hematuria.  Denies any runny nose or sore throat.  Does have nonproductive cough.  No chest pain or shortness of breath.  No significant abdominal pain.  No diarrhea.  No travel or sick contacts.  The history is provided by the patient.  Fever Associated symptoms: cough, dysuria, headaches, myalgias and nausea   Associated symptoms: no chest pain, no congestion, no rash, no rhinorrhea and no vomiting        Prior to Admission medications   Medication Sig Start Date End Date Taking? Authorizing Provider  acetaminophen  (TYLENOL ) 325 MG tablet Take 325 mg by mouth as needed for moderate pain.    [provider]  calcium  carbonate (SUPER CALCIUM ) 1500 (600 Ca) MG TABS tablet Take 600 mg of elemental calcium  by mouth daily with breakfast.    [provider]  cetirizine (ZYRTEC) 10 MG tablet Take 10 mg by mouth daily.    [provider]  furosemide  (LASIX ) 40 MG tablet Take 1 tablet (40 mg total) by mouth daily. You may take an additional 40 mg dose for leg swelling or shortness of breath in the evening. 11/05/23 02/03/24  Shlomo Wilbert SAUNDERS, MD  losartan  (COZAAR ) 25 MG tablet Take 0.5 tablets (12.5 mg total) by mouth daily. 01/24/24 04/23/24  Shlomo Wilbert SAUNDERS, MD  metoprolol  succinate (TOPROL  XL) 50 MG  24 hr tablet Take 1 tablet (50 mg total) by mouth daily. 08/22/23   Ladona Heinz, MD  Multiple Vitamins-Minerals (MULTIVITAMIN WITH MINERALS) tablet Take 1 tablet by mouth daily.    [provider]  oxybutynin  (DITROPAN  XL) 15 MG 24 hr tablet Take 15 mg by mouth at bedtime.    [provider]  Potassium Chloride  ER 20 MEQ TBCR Take 1 tablet (20 mEq total) by mouth daily. 11/14/23   Lelon Hamilton T, PA-C  spironolactone  (ALDACTONE ) 25 MG tablet Take 0.5 tablets (12.5 mg total) by mouth daily. 01/09/24   Lelon Hamilton T, PA-C  VITAMIN D PO Take 1 capsule by mouth daily.    [provider]    Allergies: Morphine and codeine, Septra [sulfamethoxazole-trimethoprim], Sulfamethoxazole, Trimethoprim, and Latex    Review of Systems  Constitutional:  Positive for activity change, appetite change, fatigue and fever.  HENT:  Negative for congestion and rhinorrhea.   Respiratory:  Positive for cough. Negative for shortness of breath.   Cardiovascular:  Negative for chest pain.  Gastrointestinal:  Positive for nausea. Negative for abdominal pain and vomiting.  Genitourinary:  Positive for dysuria. Negative for hematuria.  Musculoskeletal:  Positive for arthralgias and myalgias.  Skin:  Negative for rash.  Neurological:  Positive for weakness and headaches.   all other systems are negative except as noted in the HPI and PMH.    Updated Vital Signs BP ROLLEN)  125/112   Pulse (!) 154   Temp (!) 103.2 F (39.6 C) (Oral)   Resp 20   Wt 49.9 kg   SpO2 96%   BMI 18.88 kg/m   Physical Exam Vitals and nursing note reviewed.  Constitutional:      General: He is not in acute distress.    Appearance: He is well-developed. He is ill-appearing.  HENT:     Head: Normocephalic and atraumatic.     Mouth/Throat:     Pharynx: No oropharyngeal exudate.  Eyes:     Conjunctiva/sclera: Conjunctivae normal.     Pupils: Pupils are equal, round, and reactive to light.  Neck:     Comments: No  meningismus. Cardiovascular:     Rate and Rhythm: Regular rhythm. Tachycardia present.     Heart sounds: Normal heart sounds. No murmur heard. Pulmonary:     Effort: Pulmonary effort is normal. No respiratory distress.     Breath sounds: Normal breath sounds.  Abdominal:     Palpations: Abdomen is soft.     Tenderness: There is no abdominal tenderness. There is no guarding or rebound.  Genitourinary:    Comments: No testicular tenderness Musculoskeletal:        General: No tenderness. Normal range of motion.     Cervical back: Normal range of motion and neck supple.  Skin:    General: Skin is warm.  Neurological:     Mental Status: He is alert and oriented to person, place, and time.     Motor: Weakness present. No abnormal muscle tone.     Comments: Lower extremity weakness at baseline.  Equal upper extremity strength  Psychiatric:        Behavior: Behavior normal.     (all labs ordered are listed, but only abnormal results are displayed) Labs Reviewed  COMPREHENSIVE METABOLIC PANEL WITH GFR - Abnormal; Notable for the following components:      Result Value   Sodium 130 (*)    Chloride 94 (*)    CO2 18 (*)    Anion gap 17 (*)    All other components within normal limits  CBC WITH DIFFERENTIAL/PLATELET - Abnormal; Notable for the following components:   WBC 17.5 (*)    Neutro Abs 14.9 (*)    Monocytes Absolute 1.2 (*)    Abs Immature Granulocytes 0.17 (*)    All other components within normal limits  PROTIME-INR - Abnormal; Notable for the following components:   Prothrombin Time 15.9 (*)    All other components within normal limits  URINALYSIS, W/ REFLEX TO CULTURE (INFECTION SUSPECTED) - Abnormal; Notable for the following components:   Color, Urine COLORLESS (*)    Ketones, ur 40 (*)    Leukocytes,Ua SMALL (*)    All other components within normal limits  RESP PANEL BY RT-PCR (RSV, FLU A&B, COVID)  [id number]  CULTURE, BLOOD (ROUTINE X 2)  CULTURE, BLOOD (ROUTINE X  2)  LACTIC ACID, PLASMA  LACTIC ACID, PLASMA    EKG: None  Radiology: DG Chest Port 1 View if patient is in a treatment room. Result Date: 01/28/2024 CLINICAL DATA:  Fever and weakness EXAM: PORTABLE CHEST 1 VIEW COMPARISON:  11/03/2022 FINDINGS: Cardiac shadow is stable. Lungs are hypoinflated but clear. Extensive orthopedic hardware is noted within the thoracic and lumbar spine stable from the prior exam. Shunt catheter is noted over the right chest. No acute bony abnormality is seen. IMPRESSION: No acute abnormality noted. Electronically Signed   By: Oneil Devonshire  M.D.   On: 01/28/2024 00:40     .Critical Care  Performed by: Carita Senior, MD Authorized by: Carita Senior, MD   Critical care provider statement:    Critical care time (minutes):  45   Critical care time was exclusive of:  Separately billable procedures and treating other patients   Critical care was necessary to treat or prevent imminent or life-threatening deterioration of the following conditions:  Sepsis   Critical care was time spent personally by me on the following activities:  Development of treatment plan with patient or surrogate, discussions with consultants, evaluation of patient's response to treatment, examination of patient, ordering and review of laboratory studies, ordering and review of radiographic studies, ordering and performing treatments and interventions, pulse oximetry, re-evaluation of patient's condition, review of old charts, blood draw for specimens and obtaining history from patient or surrogate   I assumed direction of critical care for this patient from another provider in my specialty: no     Care discussed with: admitting provider      Medications Ordered in the ED  lactated ringers  infusion (has no administration in time range)  lactated ringers  bolus 1,000 mL (1,000 mLs Intravenous New Bag/Given 01/28/24 0008)  ceFEPIme  (MAXIPIME ) 2 g in sodium chloride  0.9 % 100 mL IVPB (2 g  Intravenous New Bag/Given 01/28/24 0011)  metroNIDAZOLE  (FLAGYL ) IVPB 500 mg (has no administration in time range)  vancomycin  (VANCOCIN ) IVPB 1000 mg/200 mL premix (has no administration in time range)  ibuprofen  (ADVIL ) tablet 400 mg (400 mg Oral Given 01/27/24 2333)                                    Medical Decision Making Amount and/or Complexity of Data Reviewed Labs: ordered. Decision-making details documented in ED Course. Radiology: ordered and independent interpretation performed. Decision-making details documented in ED Course. ECG/medicine tests: ordered and independent interpretation performed. Decision-making details documented in ED Course.  Risk Prescription drug management. Decision regarding hospitalization.   Patient with spina bifida here with bodyaches, chills, fever.  Tachycardic and febrile on arrival.  Code sepsis activated.  He is given IV fluids and IV antibiotics after cultures are obtained.  Patient tachycardic, febrile on arrival.  Code sepsis was activated.  Labs show leukocytosis of 17.5, normal lactate, creatinine at baseline. Urinalysis with many whites and leukocyte esterase.  Will send for culture.  Blood and urine cultures obtained and broad-spectrum antibiotics started.  After IV fluids, patient remains hypotensive in the 80s.  She did receive 3 L of IV fluids but appears well and nontoxic.  Admission was discussed with Dr. Franky of hospitalist service but given persistent hypotension initiate Levophed  and discussed with ICU.  Patient mentating well.  Blood pressure has improved to 86/62.  Levophed  will be initiated and additional IV fluids will be given.  Admission discussed with Dr. Tobie and ICU team.  Does agree with CT abdomen pelvis to assess for other source of sepsis.     Final diagnoses:  Sepsis with acute organ dysfunction without septic shock, due to unspecified organism, unspecified organ dysfunction type [facility])  Acute cystitis  without hematuria  Septic shock [facility])    ED Discharge Orders     None          Carita Senior, MD 01/28/24 0244    Carita Senior, MD 01/28/24 [phone]

## 2024-01-29 ENCOUNTER — Inpatient Hospital Stay (HOSPITAL_COMMUNITY)

## 2024-01-29 DIAGNOSIS — G8220 Paraplegia, unspecified: Secondary | ICD-10-CM | POA: Diagnosis not present

## 2024-01-29 DIAGNOSIS — A419 Sepsis, unspecified organism: Secondary | ICD-10-CM | POA: Diagnosis not present

## 2024-01-29 DIAGNOSIS — E871 Hypo-osmolality and hyponatremia: Secondary | ICD-10-CM | POA: Diagnosis not present

## 2024-01-29 DIAGNOSIS — N319 Neuromuscular dysfunction of bladder, unspecified: Secondary | ICD-10-CM | POA: Diagnosis not present

## 2024-01-29 LAB — RESPIRATORY PANEL BY PCR

## 2024-01-29 LAB — BASIC METABOLIC PANEL WITH GFR
Anion gap: 11 (ref 5–15)
BUN: 7 mg/dL (ref 6–20)
CO2: 21 mmol/L — ABNORMAL LOW (ref 22–32)
Calcium: 8.5 mg/dL — ABNORMAL LOW (ref 8.9–10.3)
Chloride: 106 mmol/L (ref 98–111)
Creatinine, Ser: 0.63 mg/dL (ref 0.61–1.24)
GFR, Estimated: >60 mL/min (ref >60)
Glucose, Bld: 107 mg/dL — ABNORMAL HIGH (ref 70–99)
Potassium: 3.6 mmol/L (ref 3.5–5.1)
Sodium: 138 mmol/L (ref 135–145)

## 2024-01-29 LAB — URINE CULTURE: Culture: NO GROWTH

## 2024-01-29 LAB — CBC
HCT: 35.2 % — ABNORMAL LOW (ref 39.0–52.0)
Hemoglobin: 11.2 g/dL — ABNORMAL LOW (ref 13.0–17.0)
MCH: 29.7 pg (ref 26.0–34.0)
MCHC: 31.8 g/dL (ref 30.0–36.0)
MCV: 93.4 fL (ref 80.0–100.0)
Platelets: 168 K/uL (ref 150–400)
RBC: 3.77 MIL/uL — ABNORMAL LOW (ref 4.22–5.81)
RDW: 13.2 % (ref 11.5–15.5)
WBC: 16 K/uL — ABNORMAL HIGH (ref 4.0–10.5)
nRBC: 0 % (ref 0.0–0.2)

## 2024-01-29 LAB — GLUCOSE, CAPILLARY: Glucose-Capillary: 121 mg/dL — ABNORMAL HIGH (ref 70–99)

## 2024-01-29 MED ORDER — LORATADINE 10 MG PO TABS
10.0000 mg | ORAL_TABLET | Freq: Every day | ORAL | Status: DC
  Administered 2024-01-29 – 2024-02-01 (×6): 10 mg via ORAL
  Filled 2024-01-29 (×4): qty 1

## 2024-01-29 MED ORDER — ADULT MULTIVITAMIN W/MINERALS CH
1.0000 | ORAL_TABLET | Freq: Every day | ORAL | Status: DC
  Administered 2024-01-29 – 2024-02-01 (×6): 1 via ORAL
  Filled 2024-01-29 (×4): qty 1

## 2024-01-29 MED ORDER — SODIUM CHLORIDE 0.9 % IV SOLN
1.0000 g | INTRAVENOUS | Status: DC
  Administered 2024-01-29 – 2024-02-01 (×6): 1 g via INTRAVENOUS
  Filled 2024-01-29 (×4): qty 10

## 2024-01-29 MED ORDER — CALCIUM CARBONATE ANTACID 500 MG PO CHEW
1.0000 | CHEWABLE_TABLET | Freq: Three times a day (TID) | ORAL | Status: DC | PRN
  Administered 2024-01-29 – 2024-01-31 (×3): 200 mg via ORAL
  Filled 2024-01-29 (×2): qty 1

## 2024-01-29 MED ORDER — IOHEXOL 350 MG/ML SOLN
75.0000 mL | Freq: Once | INTRAVENOUS | Status: AC | PRN
  Administered 2024-01-29 (×2): 75 mL via INTRAVENOUS

## 2024-01-29 MED ORDER — ADULT MULTIVITAMIN W/MINERALS CH
1.0000 | ORAL_TABLET | Freq: Every day | ORAL | Status: DC
  Filled 2024-01-29: qty 1

## 2024-01-29 NOTE — Progress Notes (Signed)
 Progress Note   Patient: Samuel Becker FMW:[id number] DOB: 13-Feb-1991 DOA: 01/27/2024     1 DOS: the patient was seen and examined on 01/29/2024   Brief hospital course: Sanders Manninen is a 33 y.o. M with PMH significant for spina bifida, paraplegia, bicuspid aortic valve, HF with last EF 50-55% and neurogenic bladder and self catheterizes who presented to [facility] ED for evaluation of septic shock requiring pressors admitted to [facility] service in ICU. BP improved transferred to [facility] service 01/29/24.  Assessment and Plan: Septic shock unknown origin- Patient is off pressors. BP stable. MAP>65. Antibiotics de-escalated to Rocephin  therapy for possible UTI. CT abdomen /pelvis no acute etiology. Chest Xray unremarkable. Continue to follow blood cultures. MRSA screen negative. Respiratory 20 path viral panel negative.. WBC improving. Continues to have low grade temp.  Hyponatremia-  Improved with fluids.  Spina bifida with paraplegia  Neurogenic bladder Foley placed given urinary frequency. Will plan to remove. Continue home ditropan  Follow UOP/ renal indices  cont supportive care/ bowel regimen prn. Soap suds enema ordered.   Hypertension Bicuspid aortic valve - EF 50-55 ( 12/2023) Hold home antihypertensive regimen spiro, metoprolol , losartan .       Out of bed to chair. Incentive spirometry. Nursing supportive care. Fall, aspiration precautions. Diet:  Diet Orders (From admission, onward)     Start     Ordered   01/28/24 0922  Diet Heart Room service appropriate? Yes; Fluid consistency: Thin  Diet effective now       Question Answer Comment  Room service appropriate? Yes   Fluid consistency: Thin      01/28/24 0921           DVT prophylaxis: heparin  injection 5,000 Units Start: 01/28/24 1400 SCDs Start: 01/28/24 0645  Level of care: Progressive   Code Status: Full Code  Subjective: Patient is seen and examined today morning. He is lying in bed, father  at bedside. Dad states that he get enema every 2 days. He denies any complaints.   Physical Exam: Vitals:   01/29/24 0400 01/29/24 0732 01/29/24 1021 01/29/24 1111  BP: (!) 82/65 110/68 122/72 (!) 126/90  Pulse: 90 94 (!) 106 (!) 104  Resp: 18 20  (!) 27  Temp:  (!) 100.4 F (38 C)  99.5 F (37.5 C)  TempSrc:  Oral  Oral  SpO2: 93% 96%  95%  Weight:      Height:        General - Young  Caucasian male, no apparent distress HEENT - PERRLA, EOMI, atraumatic head, non tender sinuses. Lung - Clear, basal rales, no rhonchi, wheezes. Heart - S1, S2 heard, no murmurs, rubs, trace pedal edema. Abdomen - Soft, non tender, distended, bowel sounds good Neuro - Alert, awake and oriented, paraplegia Skin - Warm and dry.  Data Reviewed:      Latest Ref Rng & Units 01/29/2024    3:26 AM 01/27/2024   11:19 PM 10/07/2022    7:20 AM  CBC  WBC 4.0 - 10.5 K/uL 16.0  17.5  8.1   Hemoglobin 13.0 - 17.0 g/dL 88.7  86.3  86.1   Hematocrit 39.0 - 52.0 % 35.2  39.8  43.6   Platelets 150 - 400 K/uL 168  207  208       Latest Ref Rng & Units 01/29/2024    3:26 AM 01/28/2024   12:26 PM 01/27/2024   11:19 PM  BMP  Glucose 70 - 99 mg/dL 892  861  89  BUN 6 - 20 mg/dL 7  7  12    Creatinine 0.61 - 1.24 mg/dL 9.36  9.39  9.28   Sodium 135 - 145 mmol/L 138  136  130   Potassium 3.5 - 5.1 mmol/L 3.6  4.4  4.2   Chloride 98 - 111 mmol/L 106  102  94   CO2 22 - 32 mmol/L 21  24  18    Calcium  8.9 - 10.3 mg/dL 8.5  8.8  89.8    CT ABDOMEN PELVIS W CONTRAST Addendum Date: 01/28/2024 ADDENDUM REPORT: 01/28/2024 19:51 ADDENDUM: There is an dictation error in the body of the report. This is a male patient. The prostate gland and seminal vesicles are unremarkable. Electronically Signed   By: Dorethia Molt M.D.   On: 01/28/2024 19:51   Result Date: 01/28/2024 CLINICAL DATA:  Sepsis EXAM: CT ABDOMEN AND PELVIS WITH CONTRAST TECHNIQUE: Multidetector CT imaging of the abdomen and pelvis was performed using the  standard protocol following bolus administration of intravenous contrast. RADIATION DOSE REDUCTION: This exam was performed according to the departmental dose-optimization program which includes automated exposure control, adjustment of the mA and/or kV according to patient size and/or use of iterative reconstruction technique. CONTRAST:  80mL OMNIPAQUE  IOHEXOL  300 MG/ML  SOLN COMPARISON:  None Available. FINDINGS: Lower chest: No acute abnormality.  Moderate cardiomegaly Hepatobiliary: No focal liver abnormality is seen. No gallstones, gallbladder wall thickening, or biliary dilatation. Pancreas: Unremarkable Spleen: Unremarkable Adrenals/Urinary Tract: Adrenal glands are unremarkable. Kidneys are normal, without renal calculi, focal lesion, or hydronephrosis. Bladder is unremarkable. Stomach/Bowel: Stomach is within normal limits. Appendix appears normal. No evidence of bowel wall thickening, distention, or inflammatory changes. Vascular/Lymphatic: No significant vascular findings are present. No enlarged abdominal or pelvic lymph nodes. Reproductive: Uterus and bilateral adnexa are unremarkable. Other: Ventriculoperitoneal shunt catheter tubing is seen within the right lower quadrant peritoneal cavity. Musculoskeletal: Severe thoracolumbar scoliosis status post operative stabilization. Spina bifida. 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 No acute abnormality. IMPRESSION: 1. No acute intra-abdominal pathology identified. No definite radiographic explanation for the patient's reported symptoms. 2. Moderate cardiomegaly. 3. Severe thoracolumbar scoliosis status post operative stabilization. Spina bifida. 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 Electronically Signed: By: Dorethia Molt M.D. On: 01/28/2024 03:39   DG Chest Port 1 View if patient is in a treatment room. Result Date: 01/28/2024 CLINICAL DATA:  Fever and  weakness EXAM: PORTABLE CHEST 1 VIEW COMPARISON:  11/03/2022 FINDINGS: Cardiac shadow is stable. Lungs are hypoinflated but clear. Extensive orthopedic hardware is noted within the thoracic and lumbar spine stable from the prior exam. Shunt catheter is noted over the right chest. No acute bony abnormality is seen. IMPRESSION: No acute abnormality noted. Electronically Signed   By: Oneil Devonshire M.D.   On: 01/28/2024 00:40    Family Communication: Discussed with patient, father at bedside, they understand and agree. All questions answered.  Disposition: Status is: Inpatient Remains inpatient appropriate because: fever work up, severity of illness.  Planned Discharge Destination: Home with Home Health     Time spent: 43 minutes  Author: Concepcion Riser, MD 01/29/2024 11:54 AM Secure chat 7am to 7pm For on call review [email or web address].

## 2024-01-29 NOTE — Plan of Care (Signed)
  Problem: Health Behavior/Discharge Planning: Goal: Ability to manage health-related needs will improve Outcome: Progressing   Problem: Clinical Measurements: Goal: Ability to maintain clinical measurements within normal limits will improve Outcome: Progressing Goal: Diagnostic test results will improve Outcome: Progressing   Problem: Nutrition: Goal: Adequate nutrition will be maintained Outcome: Progressing   Problem: Elimination: Goal: Will not experience complications related to bowel motility Outcome: Progressing Goal: Will not experience complications related to urinary retention Outcome: Progressing   Problem: Pain Managment: Goal: General experience of comfort will improve and/or be controlled Outcome: Progressing   Problem: Safety: Goal: Ability to remain free from injury will improve Outcome: Progressing

## 2024-01-30 DIAGNOSIS — R000 Tachycardia, unspecified: Secondary | ICD-10-CM

## 2024-01-30 DIAGNOSIS — Q059 Spina bifida, unspecified: Secondary | ICD-10-CM | POA: Diagnosis not present

## 2024-01-30 DIAGNOSIS — E871 Hypo-osmolality and hyponatremia: Secondary | ICD-10-CM | POA: Diagnosis not present

## 2024-01-30 DIAGNOSIS — A419 Sepsis, unspecified organism: Secondary | ICD-10-CM | POA: Diagnosis not present

## 2024-01-30 LAB — BASIC METABOLIC PANEL WITH GFR
Anion gap: 10 (ref 5–15)
BUN: 7 mg/dL (ref 6–20)
CO2: 23 mmol/L (ref 22–32)
Calcium: 8.6 mg/dL — ABNORMAL LOW (ref 8.9–10.3)
Chloride: 104 mmol/L (ref 98–111)
Creatinine, Ser: 0.43 mg/dL — ABNORMAL LOW (ref 0.61–1.24)
GFR, Estimated: >60 mL/min (ref >60)
Glucose, Bld: 102 mg/dL — ABNORMAL HIGH (ref 70–99)
Potassium: 3.6 mmol/L (ref 3.5–5.1)
Sodium: 137 mmol/L (ref 135–145)

## 2024-01-30 LAB — CBC
HCT: 35.4 % — ABNORMAL LOW (ref 39.0–52.0)
Hemoglobin: 11.6 g/dL — ABNORMAL LOW (ref 13.0–17.0)
MCH: 29.8 pg (ref 26.0–34.0)
MCHC: 32.8 g/dL (ref 30.0–36.0)
MCV: 91 fL (ref 80.0–100.0)
Platelets: 192 K/uL (ref 150–400)
RBC: 3.89 MIL/uL — ABNORMAL LOW (ref 4.22–5.81)
RDW: 13.4 % (ref 11.5–15.5)
WBC: 11.7 K/uL — ABNORMAL HIGH (ref 4.0–10.5)
nRBC: 0 % (ref 0.0–0.2)

## 2024-01-30 LAB — TSH: TSH: 1.838 u[IU]/mL (ref 0.350–4.500)

## 2024-01-30 MED ORDER — FUROSEMIDE 40 MG PO TABS: 40.0000 mg | ORAL_TABLET | Freq: Every day | ORAL | Status: DC | PRN

## 2024-01-30 MED ORDER — ALBUTEROL SULFATE (2.5 MG/3ML) 0.083% IN NEBU
2.5000 mg | INHALATION_SOLUTION | Freq: Four times a day (QID) | RESPIRATORY_TRACT | Status: DC | PRN
  Administered 2024-01-30 – 2024-01-31 (×6): 2.5 mg via RESPIRATORY_TRACT
  Filled 2024-01-30 (×4): qty 3

## 2024-01-30 MED ORDER — METOPROLOL TARTRATE 5 MG/5ML IV SOLN
5.0000 mg | Freq: Once | INTRAVENOUS | Status: AC
  Administered 2024-01-30 (×2): 5 mg via INTRAVENOUS
  Filled 2024-01-30: qty 5

## 2024-01-30 MED ORDER — SPIRONOLACTONE 12.5 MG HALF TABLET
12.5000 mg | ORAL_TABLET | Freq: Every day | ORAL | Status: DC
  Administered 2024-01-30 – 2024-02-01 (×4): 12.5 mg via ORAL
  Filled 2024-01-30 (×3): qty 1

## 2024-01-30 MED ORDER — FUROSEMIDE 40 MG PO TABS
40.0000 mg | ORAL_TABLET | Freq: Every day | ORAL | Status: DC
  Administered 2024-01-30 – 2024-02-01 (×4): 40 mg via ORAL
  Filled 2024-01-30 (×3): qty 1

## 2024-01-30 MED ORDER — FUROSEMIDE 40 MG PO TABS
40.0000 mg | ORAL_TABLET | Freq: Every day | ORAL | Status: DC | PRN
  Administered 2024-01-30 (×2): 40 mg via ORAL
  Filled 2024-01-30: qty 1

## 2024-01-30 NOTE — Progress Notes (Signed)
 Progress Note   Patient: Samuel Becker FMW:[id number] DOB: 06/16/1991 DOA: 01/27/2024     2 DOS: the patient was seen and examined on 01/30/2024   Brief hospital course: Oley Lahaie is a 33 y.o. M with PMH significant for spina bifida, paraplegia, bicuspid aortic valve, HF with last EF 50-55% and neurogenic bladder and self catheterizes who presented to [facility] ED for evaluation of septic shock requiring pressors admitted to [facility] service in ICU. BP improved transferred to [facility] service 01/29/24.  Assessment and Plan: Septic shock unknown origin- Patient is off pressors. BP stable. MAP>65. Tmax 102.9, tachycardia persists. Off vancomycin , continue Rocephin  therapy, add azithromycin  for possible pneumonia. UTI ruled out. CT abdomen /pelvis no acute etiology. Chest Xray unremarkable. CTA chest ruled out PE, has pleural effusions. Continue to follow blood cultures. MRSA screen negative. Respiratory 20 path viral panel negative. WBC improving. Encourage incentive spirometry, out of bed. Add bronchodilators, supplemental o2 as needed. Resumed home diuretics.   Hyponatremia-  Improved with fluids.  Spina bifida with paraplegia  Neurogenic bladder Foley placed given urinary frequency. UTI ruled out. Continue home ditropan  cont supportive care/ bowel regimen prn. Soap suds enema every other day..   Hypertension Bicuspid aortic valve - EF 50-55 ( 12/2023) Resumed home metoprolol , lasix , aldactone . Beta blocker will help his tachycardia, IV lopressor  PRN.    Out of bed to chair. Incentive spirometry. Nursing supportive care. Fall, aspiration precautions. Diet:  Diet Orders (From admission, onward)     Start     Ordered   01/28/24 0922  Diet Heart Room service appropriate? Yes; Fluid consistency: Thin  Diet effective now       Question Answer Comment  Room service appropriate? Yes   Fluid consistency: Thin      01/28/24 0921           DVT prophylaxis: heparin   injection 5,000 Units Start: 01/28/24 1400 SCDs Start: 01/28/24 0645  Level of care: Progressive   Code Status: Full Code  Subjective: Patient is seen and examined today morning. He is lying in bed, parents at bedside. Tachycardia persists, tmax 102.9, belly distended. Has cough, sob.   Physical Exam: Vitals:   01/30/24 0500 01/30/24 0600 01/30/24 0733 01/30/24 1158  BP:   117/83 (!) 131/96  Pulse: 94 100 (!) 108 (!) 124  Resp: 18 20 (!) 24 (!) 26  Temp:   99 F (37.2 C) 99.2 F (37.3 C)  TempSrc:   Oral Oral  SpO2: 92% 92% 94% 99%  Weight: 56 kg     Height:        General - Young  Caucasian male, mild respiratory distress HEENT - PERRLA, EOMI, atraumatic head, non tender sinuses. Lung - Clear, basal rales, no rhonchi, wheezes. Heart - S1, S2 heard, tachycardia, trace pedal edema. Abdomen - Soft, non tender, distended, bowel sounds good Neuro - Alert, awake and oriented, paraplegia Skin - Warm and dry.  Data Reviewed:      Latest Ref Rng & Units 01/30/2024    9:39 AM 01/29/2024    3:26 AM 01/27/2024   11:19 PM  CBC  WBC 4.0 - 10.5 K/uL 11.7  16.0  17.5   Hemoglobin 13.0 - 17.0 g/dL 88.3  88.7  86.3   Hematocrit 39.0 - 52.0 % 35.4  35.2  39.8   Platelets 150 - 400 K/uL 192  168  207       Latest Ref Rng & Units 01/30/2024    9:39 AM 01/29/2024  3:26 AM 01/28/2024   12:26 PM  BMP  Glucose 70 - 99 mg/dL 897  892  861   BUN 6 - 20 mg/dL 7  7  7    Creatinine 0.61 - 1.24 mg/dL 9.56  9.36  9.39   Sodium 135 - 145 mmol/L 137  138  136   Potassium 3.5 - 5.1 mmol/L 3.6  3.6  4.4   Chloride 98 - 111 mmol/L 104  106  102   CO2 22 - 32 mmol/L 23  21  24    Calcium  8.9 - 10.3 mg/dL 8.6  8.5  8.8    CT Angio Chest Pulmonary Embolism (PE) W or WO Contrast Result Date: 01/29/2024 CLINICAL DATA:  Pulmonary embolism (PE) suspected, high prob. Fever and weakness EXAM: CT ANGIOGRAPHY CHEST WITH CONTRAST TECHNIQUE: Multidetector CT imaging of the chest was performed using the  standard protocol during bolus administration of intravenous contrast. Multiplanar CT image reconstructions and MIPs were obtained to evaluate the vascular anatomy. RADIATION DOSE REDUCTION: This exam was performed according to the departmental dose-optimization program which includes automated exposure control, adjustment of the mA and/or kV according to patient size and/or use of iterative reconstruction technique. CONTRAST:  75mL OMNIPAQUE  IOHEXOL  350 MG/ML SOLN COMPARISON:  CT heart 10/05/2022 FINDINGS: Cardiovascular: Satisfactory opacification of the pulmonary arteries to the segmental level. No evidence of pulmonary embolism. No central or segmental pulmonary embolus. Limited evaluation more distally due to motion artifact. The main pulmonary artery measures at the upper limits of normal. Normal heart size. No significant pericardial effusion. Limited evaluation of the aortic root that appears grossly stable in caliber measuring about 4.1 cm. Otherwise the thoracic aorta is normal in caliber. No atherosclerotic plaque of the thoracic aorta. No coronary artery calcifications. Mediastinum/Nodes: No enlarged mediastinal, hilar, or axillary lymph nodes. Thyroid gland, trachea, and esophagus demonstrate no significant findings. Lungs/Pleura: Expiratory phase of respiration with low lung volumes. Bilateral lower lobe passive atelectasis. No focal consolidation. No pulmonary nodule. No pulmonary mass. Question trace bilateral pleural effusions. No pneumothorax. Upper Abdomen: No acute abnormality. Musculoskeletal: Severe levoscoliosis of the thoracolumbar spine with compensatory dextroscoliosis of the thoracic spine. Right chest wall tubing likely related to a ventriculoperitoneal shunt. Review of the MIP images confirms the above findings. IMPRESSION: 1. No central or segmental pulmonary embolus. Limited evaluation more distally due to motion artifact. 2. Question trace bilateral pleural effusions. 3. Limited  evaluation of the aortic root that appears grossly stable in caliber measuring about 4.1 cm. Electronically Signed   By: Morgane  Naveau M.D.   On: 01/29/2024 19:28    Family Communication: Discussed with patient, father at bedside, they understand and agree. All questions answered.  Disposition: Status is: Inpatient Remains inpatient appropriate because: fever work up, severity of illness.  Planned Discharge Destination: Home with Home Health     Time spent: 51 minutes  Author: Concepcion Riser, MD 01/30/2024 3:24 PM Secure chat 7am to 7pm For on call review [email or web address].

## 2024-01-30 NOTE — Plan of Care (Signed)
  Problem: Education: Goal: Knowledge of General Education information will improve Description: Including pain rating scale, medication(s)/side effects and non-pharmacologic comfort measures Outcome: Progressing   Problem: Health Behavior/Discharge Planning: Goal: Ability to manage health-related needs will improve Outcome: Progressing   Problem: Clinical Measurements: Goal: Ability to maintain clinical measurements within normal limits will improve Outcome: Progressing Goal: Diagnostic test results will improve Outcome: Progressing Goal: Respiratory complications will improve Outcome: Progressing   Problem: Nutrition: Goal: Adequate nutrition will be maintained Outcome: Progressing   Problem: Pain Managment: Goal: General experience of comfort will improve and/or be controlled Outcome: Progressing   Problem: Safety: Goal: Ability to remain free from injury will improve Outcome: Progressing

## 2024-01-31 DIAGNOSIS — Q059 Spina bifida, unspecified: Secondary | ICD-10-CM | POA: Diagnosis not present

## 2024-01-31 DIAGNOSIS — E871 Hypo-osmolality and hyponatremia: Secondary | ICD-10-CM | POA: Diagnosis not present

## 2024-01-31 DIAGNOSIS — R000 Tachycardia, unspecified: Secondary | ICD-10-CM | POA: Diagnosis not present

## 2024-01-31 DIAGNOSIS — A419 Sepsis, unspecified organism: Secondary | ICD-10-CM | POA: Diagnosis not present

## 2024-01-31 LAB — CBC
HCT: 35.4 % — ABNORMAL LOW (ref 39.0–52.0)
Hemoglobin: 11.5 g/dL — ABNORMAL LOW (ref 13.0–17.0)
MCH: 29.6 pg (ref 26.0–34.0)
MCHC: 32.5 g/dL (ref 30.0–36.0)
MCV: 91 fL (ref 80.0–100.0)
Platelets: 202 K/uL (ref 150–400)
RBC: 3.89 MIL/uL — ABNORMAL LOW (ref 4.22–5.81)
RDW: 13.4 % (ref 11.5–15.5)
WBC: 8.9 K/uL (ref 4.0–10.5)
nRBC: 0 % (ref 0.0–0.2)

## 2024-01-31 LAB — BASIC METABOLIC PANEL WITH GFR
Anion gap: 9 (ref 5–15)
BUN: 5 mg/dL — ABNORMAL LOW (ref 6–20)
CO2: 29 mmol/L (ref 22–32)
Calcium: 8.3 mg/dL — ABNORMAL LOW (ref 8.9–10.3)
Chloride: 100 mmol/L (ref 98–111)
Creatinine, Ser: 0.65 mg/dL (ref 0.61–1.24)
GFR, Estimated: >60 mL/min (ref >60)
Glucose, Bld: 101 mg/dL — ABNORMAL HIGH (ref 70–99)
Potassium: 3.3 mmol/L — ABNORMAL LOW (ref 3.5–5.1)
Sodium: 138 mmol/L (ref 135–145)

## 2024-01-31 MED ORDER — AZITHROMYCIN 500 MG PO TABS
500.0000 mg | ORAL_TABLET | Freq: Every day | ORAL | Status: DC
  Administered 2024-01-31 – 2024-02-01 (×2): 500 mg via ORAL
  Filled 2024-01-31 (×2): qty 1

## 2024-01-31 MED ORDER — POTASSIUM CHLORIDE 20 MEQ PO PACK
40.0000 meq | PACK | Freq: Once | ORAL | Status: AC
  Administered 2024-01-31: 40 meq via ORAL
  Filled 2024-01-31: qty 2

## 2024-01-31 MED ORDER — FLEET ENEMA RE ENEM
1.0000 | ENEMA | Freq: Once | RECTAL | Status: AC
  Administered 2024-01-31: 1 via RECTAL
  Filled 2024-01-31: qty 1

## 2024-01-31 NOTE — Care Management Important Message (Signed)
 Important Message  Patient Details  Name: JERRIC OYEN MRN: [id number] Date of Birth: 04-Feb-1991   Important Message Given:  Yes - Medicare IM     Claretta Deed 01/31/2024, 2:37 PM

## 2024-01-31 NOTE — Progress Notes (Signed)
 Progress Note   Patient: Samuel Becker FMW:[id number] DOB: [birth year] DOA: 01/27/2024     3 DOS: the patient was seen and examined on 01/31/2024   Brief hospital course: Derius Ghosh is a 33 y.o. M with PMH significant for spina bifida, paraplegia, bicuspid aortic valve, HF with last EF 50-55% and neurogenic bladder and self catheterizes who presented to [facility] ED for evaluation of septic shock requiring pressors admitted to [facility] service in ICU. BP improved transferred to TRH service 01/29/24.  Assessment and Plan: Septic shock unknown origin- Patient is off pressors. BP stable. MAP>65. Fever, tachycardia improved. Off vancomycin , continue Rocephin  therapy, add azithromycin  for possible pneumonia. UTI ruled out. CT abdomen /pelvis no acute etiology. Chest Xray unremarkable. CTA chest ruled out PE, has pleural effusions. Continue to follow blood cultures. MRSA screen negative. Respiratory 20 path viral panel negative. WBC normal today. Encourage incentive spirometry, out of bed. Continue bronchodilators, supplemental o2 as needed. Resumed home diuretics.   Hyponatremia-  Improved with fluids.  Spina bifida with paraplegia  Neurogenic bladder Foley placed given urinary frequency. UTI ruled out. Continue home ditropan . Plan to remove foley prior to discharge. He does self cath at home. Cont supportive care/ bowel regimen prn. Enema every other day.   Hypertension Bicuspid aortic valve - EF 50-55 ( 12/2023) Resumed home metoprolol , lasix , aldactone . IV lopressor  PRN for high HR.    Out of bed to chair. Incentive spirometry. Nursing supportive care. Fall, aspiration precautions. Diet:  Diet Orders (From admission, onward)     Start     Ordered   01/28/24 0922  Diet Heart Room service appropriate? Yes; Fluid consistency: Thin  Diet effective now       Question Answer Comment  Room service appropriate? Yes   Fluid consistency: Thin      01/28/24 0921            DVT prophylaxis: heparin  injection 5,000 Units Start: 01/28/24 1400 SCDs Start: 01/28/24 0645  Level of care: Progressive   Code Status: Full Code  Subjective: Patient is seen and examined today morning. He is lying in bed, feels short of breath. Parents at bedside stated that he had severe pneumonia before. Afebrile. Cough better.   Physical Exam: Vitals:   01/31/24 0000 01/31/24 0442 01/31/24 0759 01/31/24 1149  BP: (!) 123/94 (!) 149/94 (!) 122/91 (!) 143/108  Pulse: 99 (!) 104 96 (!) 108  Resp: 19 19 16 19   Temp:  98.8 F (37.1 C) 98.6 F (37 C) 98.6 F (37 C)  TempSrc:  Oral Oral Oral  SpO2:  98%  96%  Weight:  54.2 kg    Height:        General - Young  Caucasian male, mild respiratory distress HEENT - PERRLA, EOMI, atraumatic head, non tender sinuses. Lung - Clear, basal rales, diffuse wheezes noted Heart - S1, S2 heard, tachycardia, trace pedal edema. Abdomen - Soft, non tender, distended, bowel sounds good Neuro - Alert, awake and oriented, paraplegia Skin - Warm and dry.  Data Reviewed:      Latest Ref Rng & Units 01/31/2024    3:44 AM 01/30/2024    9:39 AM 01/29/2024    3:26 AM  CBC  WBC 4.0 - 10.5 K/uL 8.9  11.7  16.0   Hemoglobin 13.0 - 17.0 g/dL 88.4  88.3  88.7   Hematocrit 39.0 - 52.0 % 35.4  35.4  35.2   Platelets 150 - 400 K/uL 202  192  168  Latest Ref Rng & Units 01/31/2024    3:44 AM 01/30/2024    9:39 AM 01/29/2024    3:26 AM  BMP  Glucose 70 - 99 mg/dL 898  897  892   BUN 6 - 20 mg/dL 5  7  7    Creatinine 0.61 - 1.24 mg/dL 9.34  9.56  9.36   Sodium 135 - 145 mmol/L 138  137  138   Potassium 3.5 - 5.1 mmol/L 3.3  3.6  3.6   Chloride 98 - 111 mmol/L 100  104  106   CO2 22 - 32 mmol/L 29  23  21    Calcium  8.9 - 10.3 mg/dL 8.3  8.6  8.5    CT Angio Chest Pulmonary Embolism (PE) W or WO Contrast Result Date: 01/29/2024 CLINICAL DATA:  Pulmonary embolism (PE) suspected, high prob. Fever and weakness EXAM: CT ANGIOGRAPHY CHEST WITH  CONTRAST TECHNIQUE: Multidetector CT imaging of the chest was performed using the standard protocol during bolus administration of intravenous contrast. Multiplanar CT image reconstructions and MIPs were obtained to evaluate the vascular anatomy. RADIATION DOSE REDUCTION: This exam was performed according to the departmental dose-optimization program which includes automated exposure control, adjustment of the mA and/or kV according to patient size and/or use of iterative reconstruction technique. CONTRAST:  75mL OMNIPAQUE  IOHEXOL  350 MG/ML SOLN COMPARISON:  CT heart 10/05/2022 FINDINGS: Cardiovascular: Satisfactory opacification of the pulmonary arteries to the segmental level. No evidence of pulmonary embolism. No central or segmental pulmonary embolus. Limited evaluation more distally due to motion artifact. The main pulmonary artery measures at the upper limits of normal. Normal heart size. No significant pericardial effusion. Limited evaluation of the aortic root that appears grossly stable in caliber measuring about 4.1 cm. Otherwise the thoracic aorta is normal in caliber. No atherosclerotic plaque of the thoracic aorta. No coronary artery calcifications. Mediastinum/Nodes: No enlarged mediastinal, hilar, or axillary lymph nodes. Thyroid gland, trachea, and esophagus demonstrate no significant findings. Lungs/Pleura: Expiratory phase of respiration with low lung volumes. Bilateral lower lobe passive atelectasis. No focal consolidation. No pulmonary nodule. No pulmonary mass. Question trace bilateral pleural effusions. No pneumothorax. Upper Abdomen: No acute abnormality. Musculoskeletal: Severe levoscoliosis of the thoracolumbar spine with compensatory dextroscoliosis of the thoracic spine. Right chest wall tubing likely related to a ventriculoperitoneal shunt. Review of the MIP images confirms the above findings. IMPRESSION: 1. No central or segmental pulmonary embolus. Limited evaluation more distally due  to motion artifact. 2. Question trace bilateral pleural effusions. 3. Limited evaluation of the aortic root that appears grossly stable in caliber measuring about 4.1 cm. Electronically Signed   By: Morgane  Naveau M.D.   On: 01/29/2024 19:28    Family Communication: Discussed with patient, father at bedside, they understand and agree. All questions answered.  Disposition: Status is: Inpatient Remains inpatient appropriate because: fever work up, severity of illness.  Planned Discharge Destination: Home with Home Health     Time spent: 52 minutes  Author: Concepcion Riser, MD 01/31/2024 1:58 PM Secure chat 7am to 7pm For on call review [email or web address].

## 2024-02-01 DIAGNOSIS — I420 Dilated cardiomyopathy: Secondary | ICD-10-CM | POA: Diagnosis not present

## 2024-02-01 DIAGNOSIS — A419 Sepsis, unspecified organism: Secondary | ICD-10-CM | POA: Diagnosis not present

## 2024-02-01 DIAGNOSIS — Q051 Thoracic spina bifida with hydrocephalus: Secondary | ICD-10-CM | POA: Diagnosis not present

## 2024-02-01 DIAGNOSIS — G8220 Paraplegia, unspecified: Secondary | ICD-10-CM | POA: Diagnosis not present

## 2024-02-01 DIAGNOSIS — Z993 Dependence on wheelchair: Secondary | ICD-10-CM

## 2024-02-01 DIAGNOSIS — M4120 Other idiopathic scoliosis, site unspecified: Secondary | ICD-10-CM

## 2024-02-01 LAB — CBC
HCT: 37.3 % — ABNORMAL LOW (ref 39.0–52.0)
Hemoglobin: 11.9 g/dL — ABNORMAL LOW (ref 13.0–17.0)
MCH: 29.4 pg (ref 26.0–34.0)
MCHC: 31.9 g/dL (ref 30.0–36.0)
MCV: 92.1 fL (ref 80.0–100.0)
Platelets: 226 K/uL (ref 150–400)
RBC: 4.05 MIL/uL — ABNORMAL LOW (ref 4.22–5.81)
RDW: 13.4 % (ref 11.5–15.5)
WBC: 7.1 K/uL (ref 4.0–10.5)
nRBC: 0 % (ref 0.0–0.2)

## 2024-02-01 LAB — BASIC METABOLIC PANEL WITH GFR
Anion gap: 14 (ref 5–15)
BUN: 8 mg/dL (ref 6–20)
CO2: 28 mmol/L (ref 22–32)
Calcium: 8.7 mg/dL — ABNORMAL LOW (ref 8.9–10.3)
Chloride: 98 mmol/L (ref 98–111)
Creatinine, Ser: 0.55 mg/dL — ABNORMAL LOW (ref 0.61–1.24)
GFR, Estimated: >60 mL/min (ref >60)
Glucose, Bld: 93 mg/dL (ref 70–99)
Potassium: 3.8 mmol/L (ref 3.5–5.1)
Sodium: 140 mmol/L (ref 135–145)

## 2024-02-01 MED ORDER — DOCUSATE SODIUM 100 MG PO CAPS: 100.0000 mg | ORAL_CAPSULE | Freq: Two times a day (BID) | ORAL | 0 refills | Status: AC | PRN

## 2024-02-01 MED ORDER — AMOXICILLIN-POT CLAVULANATE 875-125 MG PO TABS: 1.0000 | ORAL_TABLET | Freq: Two times a day (BID) | ORAL | 0 refills | Status: DC

## 2024-02-01 MED ORDER — POLYETHYLENE GLYCOL 3350 17 G PO PACK: 17.0000 g | PACK | Freq: Every day | ORAL | 0 refills | Status: AC | PRN

## 2024-02-01 NOTE — TOC Transition Note (Signed)
 Transition of Care [facility]) - Discharge Note   Patient Details  Name: Samuel Becker MRN: [id number] Date of Birth: 04/11/1991  [facility] CM/SW Contact:  Roxie KANDICE Stain, RN Phone Number: 02/01/2024, 2:05 PM   Clinical Narrative:    Patient stable for discharge. Order for DME-oxygen. Spoke to patient's father, Dorise regarding DME. Dorise is agreeable to use [employer]. Notified Jeramine of DME order.  Father will transport home.     Final next level of care: Home/Self Care Barriers to Discharge: Barriers Resolved   Patient Goals and CMS Choice Patient states their goals for this hospitalization and ongoing recovery are:: wants to remain independent          Discharge Placement             Home          Discharge Plan and Services Additional resources added to the After Visit Summary for     Discharge Planning Services: CM Consult            DME Arranged: Oxygen DME Agency: [employer] Date DME Agency Contacted: 02/01/24 Time DME Agency Contacted: [phone] Representative spoke with at DME Agency: [place]            Social Drivers of Health (SDOH) Interventions SDOH Screenings   Food Insecurity: No Food Insecurity (01/28/2024)  Housing: Low Risk  (01/28/2024)  Transportation Needs: No Transportation Needs (01/28/2024)  Utilities: Not At Risk (01/28/2024)  Alcohol  Screen: Low Risk  (10/06/2022)  Financial Resource Strain: Low Risk  (10/06/2022)  Physical Activity: Insufficiently Active (09/01/2021)   Received from [facility]  Social Connections: Unknown (09/21/2022)   Received from [facility]  Stress: No Stress Concern Present (09/01/2021)   Received from [facility]  Tobacco Use: Low Risk  (01/27/2024)     Readmission Risk Interventions    10/03/2022    6:02 PM 09/26/2022    9:25 AM 09/21/2022    3:22 PM  Readmission Risk Prevention Plan  Post Dischage Appt Complete Complete Complete  Medication Screening Complete Complete  Complete  Transportation Screening Complete Complete Complete

## 2024-02-01 NOTE — Discharge Summary (Signed)
 Physician Discharge Summary   Patient: Samuel Becker MRN: [id number] DOB: 11-09-1990  Admit date:     01/27/2024  Discharge date: 02/01/24  Discharge Physician: Concepcion Riser   PCP: Shlomo Wilbert SAUNDERS, MD   Recommendations at discharge:    PCP follow up in 1 week. Pulmonology follow up suggested.  Discharge Diagnoses: Principal Problem:   Septic shock (HCC) Active Problems:   Heart failure with improved ejection fraction (HFimpEF) (HCC)   Paraplegia (HCC)   Dilated cardiomyopathy (HCC)   Idiopathic scoliosis and kyphoscoliosis   Neurogenic bladder   Obstructive hydrocephalus (HCC)   Spina bifida of thoracic region with hydrocephalus [facility])   Wheelchair dependence  Resolved Problems:   * No resolved hospital problems. *  Hospital Course: Samuel Becker is a 33 y.o. M with PMH significant for spina bifida, paraplegia, bicuspid aortic valve, HF with last EF 50-55% and neurogenic bladder and self catheterizes who presented to [facility] ED for evaluation of septic shock requiring pressors admitted to [facility] service in ICU. BP improved transferred to [facility] service 01/29/24. Fever work up unremarkable. Did receive Vanco and later rocephin +azithro for possible pneumonia. He is afebrile for last 3 days, wbc trended down. Advised PCP, pulmonology follow up upon discharge. He has home o2 as needed, nebulizer machine and Bipap at night. Uses enema at home, self caths, advised hygiene precautions, encouraged incentive spirometry. Patient and his parents understand and agree with discharge plan.   Assessment and Plan: Septic shock unknown origin- Patient is off pressors. BP stable. MAP>65. Fever, tachycardia improved. UTI ruled out. CT abdomen/ pelvis no acute etiology. Chest Xray unremarkable. CTA chest ruled out PE, has pleural effusions. MRSA screen negative. Blood cultures so far negative. Respiratory 20 path viral panel negative. WBC normal today. Remained afebrile since 3  days. Off vancomycin , got Rocephin  add azithromycin  tx for possible pneumonia. Antibiotics changed to Augmentin  for 5 days upon discharge. Encourage incentive spirometry, out of bed to chair. Continue bronchodilators, supplemental o2 1-2L as needed at home. Resumed home diuretics. DME arranged by TOC. Patient and his parent agree with discharge plan.    Hyponatremia-  Improved with fluids.  Hypokalemia- Improved with supplements. He is on potassium sparing diuretics, supplements at home.  Hypertension Bicuspid aortic valve - EF 50-55 ( 12/2023) Resumed home metoprolol , ARB upon discharge.  Heart failure with preserved EF: Resumed home dose Lasix , beta blocker, ARB, aldactone .   Spina bifida with paraplegia  Neurogenic bladder Foley placed given urinary frequency. UTI ruled out. Continue home ditropan . Plan to remove foley prior to discharge. He does self cath at home. Cont supportive care/ bowel regimen prn. Enema every other day.       Consultants: PCCM  Procedures performed: none  Disposition: Home Diet recommendation:  Discharge Diet Orders (From admission, onward)     Start     Ordered   02/01/24 0000  Diet - low sodium heart healthy        02/01/24 1306           Cardiac diet DISCHARGE MEDICATION: Allergies as of 02/01/2024       Reactions   Morphine And Codeine Other (See Comments)   Hallucinations    Septra [sulfamethoxazole-trimethoprim] Other (See Comments)   Gi upset    Sulfamethoxazole Other (See Comments)   Gi upset    Trimethoprim Other (See Comments)   Unknown    Latex Swelling, Rash        Medication List     TAKE these medications  acetaminophen  325 MG tablet Commonly known as: TYLENOL  Take 325 mg by mouth as needed for moderate pain (pain score 4-6) or mild pain (pain score 1-3).   amoxicillin -clavulanate 875-125 MG tablet Commonly known as: AUGMENTIN  Take 1 tablet by mouth 2 (two) times daily for 5 days.   cetirizine 10 MG  tablet Commonly known as: ZYRTEC Take 10 mg by mouth daily.   docusate sodium  100 MG capsule Commonly known as: COLACE Take 1 capsule (100 mg total) by mouth 2 (two) times daily as needed for mild constipation.   furosemide  40 MG tablet Commonly known as: LASIX  Take 1 tablet (40 mg total) by mouth daily. You may take an additional 40 mg dose for leg swelling or shortness of breath in the evening.   losartan  25 MG tablet Commonly known as: COZAAR  Take 0.5 tablets (12.5 mg total) by mouth daily.   metoprolol  succinate 50 MG 24 hr tablet Commonly known as: Toprol  XL Take 1 tablet (50 mg total) by mouth daily.   multivitamin with minerals tablet Take 1 tablet by mouth daily.   oxybutynin  15 MG 24 hr tablet Commonly known as: DITROPAN  XL Take 15 mg by mouth at bedtime.   polyethylene glycol 17 g packet Commonly known as: MIRALAX  / GLYCOLAX  Take 17 g by mouth daily as needed for moderate constipation.   Potassium Chloride  ER 20 MEQ Tbcr Take 1 tablet (20 mEq total) by mouth daily.   spironolactone  25 MG tablet Commonly known as: ALDACTONE  Take 0.5 tablets (12.5 mg total) by mouth daily.   Super Calcium  1500 (600 Ca) MG Tabs tablet Generic drug: calcium  carbonate Take 600 mg of elemental calcium  by mouth daily with breakfast.   surgical lubricant gel Apply 1 Application topically as needed (for cath).   VITAMIN D PO Take 1 capsule by mouth daily.               Durable Medical Equipment  (From admission, onward)           Start     Ordered   02/01/24 1305  DME Oxygen  Once       Question Answer Comment  Length of Need 6 Months   Mode or (Route) Nasal cannula   Liters per Minute 1   Frequency Continuous (stationary and portable oxygen unit needed)      02/01/24 1306            Discharge Exam: Filed Weights   01/30/24 0500 01/31/24 0442 02/01/24 0557  Weight: 56 kg 54.2 kg 54.3 kg      02/01/2024   12:07 PM 02/01/2024    9:00 AM 02/01/2024     5:57 AM  Vitals with BMI  Weight   119 lbs 11 oz  BMI   20.54  Systolic 110 128   Diastolic 94 96   Pulse 103 101     General - Young  Caucasian male, no respiratory distress HEENT - PERRLA, EOMI, atraumatic head, non tender sinuses. Lung - Clear, basal rales, diffuse wheezes improved. Heart - S1, S2 heard, tachycardia, trace pedal edema. Abdomen - Soft, non tender, distended, bowel sounds good Neuro - Alert, awake and oriented, paraplegia Skin - Warm and dry.  Condition at discharge: stable  The results of significant diagnostics from this hospitalization (including imaging, microbiology, ancillary and laboratory) are listed below for reference.   Imaging Studies: CT Angio Chest Pulmonary Embolism (PE) W or WO Contrast Result Date: 01/29/2024 CLINICAL DATA:  Pulmonary embolism (PE) suspected, high  prob. Fever and weakness EXAM: CT ANGIOGRAPHY CHEST WITH CONTRAST TECHNIQUE: Multidetector CT imaging of the chest was performed using the standard protocol during bolus administration of intravenous contrast. Multiplanar CT image reconstructions and MIPs were obtained to evaluate the vascular anatomy. RADIATION DOSE REDUCTION: This exam was performed according to the departmental dose-optimization program which includes automated exposure control, adjustment of the mA and/or kV according to patient size and/or use of iterative reconstruction technique. CONTRAST:  75mL OMNIPAQUE  IOHEXOL  350 MG/ML SOLN COMPARISON:  CT heart 10/05/2022 FINDINGS: Cardiovascular: Satisfactory opacification of the pulmonary arteries to the segmental level. No evidence of pulmonary embolism. No central or segmental pulmonary embolus. Limited evaluation more distally due to motion artifact. The main pulmonary artery measures at the upper limits of normal. Normal heart size. No significant pericardial effusion. Limited evaluation of the aortic root that appears grossly stable in caliber measuring about 4.1 cm. Otherwise the  thoracic aorta is normal in caliber. No atherosclerotic plaque of the thoracic aorta. No coronary artery calcifications. Mediastinum/Nodes: No enlarged mediastinal, hilar, or axillary lymph nodes. Thyroid gland, trachea, and esophagus demonstrate no significant findings. Lungs/Pleura: Expiratory phase of respiration with low lung volumes. Bilateral lower lobe passive atelectasis. No focal consolidation. No pulmonary nodule. No pulmonary mass. Question trace bilateral pleural effusions. No pneumothorax. Upper Abdomen: No acute abnormality. Musculoskeletal: Severe levoscoliosis of the thoracolumbar spine with compensatory dextroscoliosis of the thoracic spine. Right chest wall tubing likely related to a ventriculoperitoneal shunt. Review of the MIP images confirms the above findings. IMPRESSION: 1. No central or segmental pulmonary embolus. Limited evaluation more distally due to motion artifact. 2. Question trace bilateral pleural effusions. 3. Limited evaluation of the aortic root that appears grossly stable in caliber measuring about 4.1 cm. Electronically Signed   By: Morgane  Naveau M.D.   On: 01/29/2024 19:28   CT ABDOMEN PELVIS W CONTRAST Addendum Date: 01/28/2024 ADDENDUM REPORT: 01/28/2024 19:51 ADDENDUM: There is an dictation error in the body of the report. This is a male patient. The prostate gland and seminal vesicles are unremarkable. Electronically Signed   By: Dorethia Molt M.D.   On: 01/28/2024 19:51   Result Date: 01/28/2024 CLINICAL DATA:  Sepsis EXAM: CT ABDOMEN AND PELVIS WITH CONTRAST TECHNIQUE: Multidetector CT imaging of the abdomen and pelvis was performed using the standard protocol following bolus administration of intravenous contrast. RADIATION DOSE REDUCTION: This exam was performed according to the departmental dose-optimization program which includes automated exposure control, adjustment of the mA and/or kV according to patient size and/or use of iterative reconstruction  technique. CONTRAST:  80mL OMNIPAQUE  IOHEXOL  300 MG/ML  SOLN COMPARISON:  None Available. FINDINGS: Lower chest: No acute abnormality.  Moderate cardiomegaly Hepatobiliary: No focal liver abnormality is seen. No gallstones, gallbladder wall thickening, or biliary dilatation. Pancreas: Unremarkable Spleen: Unremarkable Adrenals/Urinary Tract: Adrenal glands are unremarkable. Kidneys are normal, without renal calculi, focal lesion, or hydronephrosis. Bladder is unremarkable. Stomach/Bowel: Stomach is within normal limits. Appendix appears normal. No evidence of bowel wall thickening, distention, or inflammatory changes. Vascular/Lymphatic: No significant vascular findings are present. No enlarged abdominal or pelvic lymph nodes. Reproductive: Uterus and bilateral adnexa are unremarkable. Other: Ventriculoperitoneal shunt catheter tubing is seen within the right lower quadrant peritoneal cavity. Musculoskeletal: Severe thoracolumbar scoliosis status post operative stabilization. Spina bifida. 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 No acute abnormality. IMPRESSION: 1. No acute intra-abdominal pathology identified. No definite radiographic explanation for the patient's reported symptoms. 2. Moderate cardiomegaly. 3. Severe thoracolumbar scoliosis  status post operative stabilization. Spina bifida. 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 Electronically Signed: By: Dorethia Molt M.D. On: 01/28/2024 03:39   DG Chest Port 1 View if patient is in a treatment room. Result Date: 01/28/2024 CLINICAL DATA:  Fever and weakness EXAM: PORTABLE CHEST 1 VIEW COMPARISON:  11/03/2022 FINDINGS: Cardiac shadow is stable. Lungs are hypoinflated but clear. Extensive orthopedic hardware is noted within the thoracic and lumbar spine stable from the prior exam. Shunt catheter is noted over the right chest. No acute bony abnormality is seen.  IMPRESSION: No acute abnormality noted. Electronically Signed   By: Oneil Devonshire M.D.   On: 01/28/2024 00:40   ECHOCARDIOGRAM COMPLETE Result Date: 01/08/2024    ECHOCARDIOGRAM REPORT   Patient Name:   Ehab C Kops Date of Exam: 01/08/2024 Medical Rec #:  [id number]            Height:       64.0 in Accession #:    [id number]           Weight:       109.5 lb Date of Birth:  May 08, 1991             BSA:          1.514 m Patient Age:    33 years             BP:           124/86 mmHg Patient Gender: M                    HR:           61 bpm. Exam Location:  Outpatient Procedure: 2D Echo, Cardiac Doppler, Color Doppler and Strain Analysis (Both            Spectral and Color Flow Doppler were utilized during procedure). Indications:    I35.9 Aortic Valve disorder  History:        Patient has prior history of Echocardiogram examinations, most                 recent 04/18/2023. Bicupsid aortic valve, Dilated aortic root.  Sonographer:    Waldo Guadalajara RCS Referring Phys: [phone] TRACI R TURNER IMPRESSIONS  1. Left ventricular ejection fraction, by estimation, is 50 to 55%. The left ventricle has low normal function. The left ventricle has no regional wall motion abnormalities. Left ventricular diastolic parameters were normal. The average left ventricular  global longitudinal strain is -17.6 %. The global longitudinal strain is abnormal.  2. Right ventricular systolic function is normal. The right ventricular size is normal.  3. The mitral valve is normal in structure. No evidence of mitral valve regurgitation. No evidence of mitral stenosis.  4. Sievers type 1 bicuspid aortic valve with left-right cusp raphe. The aortic valve is bicuspid. Aortic valve regurgitation is mild. No aortic stenosis is present.  5. Aortic dilatation noted. There is mild dilatation of the aortic root, measuring 40 mm.  6. The inferior vena cava is normal in size with greater than 50% respiratory variability, suggesting right atrial pressure of  3 mmHg. Comparison(s): No significant change from prior study. Prior images reviewed side by side. FINDINGS  Left Ventricle: Left ventricular ejection fraction, by estimation, is 50 to 55%. The left ventricle has low normal function. The left ventricle has no regional wall motion abnormalities. The average left ventricular global longitudinal strain is -17.6 %. Strain was performed and the global  longitudinal strain is abnormal. The left ventricular internal cavity size was normal in size. There is no left ventricular hypertrophy. Left ventricular diastolic parameters were normal. Right Ventricle: The right ventricular size is normal. No increase in right ventricular wall thickness. Right ventricular systolic function is normal. Left Atrium: Left atrial size was normal in size. Right Atrium: Right atrial size was normal in size. Pericardium: There is no evidence of pericardial effusion. Mitral Valve: The mitral valve is normal in structure. No evidence of mitral valve regurgitation. No evidence of mitral valve stenosis. Tricuspid Valve: The tricuspid valve is normal in structure. Tricuspid valve regurgitation is not demonstrated. No evidence of tricuspid stenosis. Aortic Valve: Sievers type 1 bicuspid aortic valve with left-right cusp raphe. The aortic valve is bicuspid. Aortic valve regurgitation is mild. Aortic regurgitation PHT measures 786 msec. No aortic stenosis is present. Aortic valve mean gradient measures 2.0 mmHg. Aortic valve peak gradient measures 3.7 mmHg. Aortic valve area, by VTI measures 2.58 cm. Pulmonic Valve: The pulmonic valve was not well visualized. Pulmonic valve regurgitation is not visualized. No evidence of pulmonic stenosis. Aorta: Aortic dilatation noted. There is mild dilatation of the aortic root, measuring 40 mm. Venous: The inferior vena cava is normal in size with greater than 50% respiratory variability, suggesting right atrial pressure of 3 mmHg. IAS/Shunts: No atrial level shunt  detected by color flow Doppler.  LEFT VENTRICLE PLAX 2D LVIDd:         3.60 cm   Diastology LVIDs:         2.70 cm   LV e' medial:    5.00 cm/s LV PW:         0.90 cm   LV E/e' medial:  12.1 LV IVS:        0.80 cm   LV e' lateral:   5.66 cm/s LVOT diam:     2.00 cm   LV E/e' lateral: 10.7 LV SV:         47 LV SV Index:   31        2D Longitudinal Strain LVOT Area:     3.14 cm  2D Strain GLS Avg:     -17.6 %  RIGHT VENTRICLE RV Basal diam:  2.50 cm RV S prime:     10.40 cm/s TAPSE (M-mode): 1.5 cm LEFT ATRIUM           Index        RIGHT ATRIUM          Index LA diam:      1.70 cm 1.12 cm/m   RA Area:     8.71 cm LA Vol (A2C): 23.4 ml 15.45 ml/m  RA Volume:   16.40 ml 10.83 ml/m LA Vol (A4C): 24.5 ml 16.18 ml/m  AORTIC VALVE AV Area (Vmax):    2.12 cm AV Area (Vmean):   2.20 cm AV Area (VTI):     2.58 cm AV Vmax:           95.70 cm/s AV Vmean:          62.100 cm/s AV VTI:            0.183 m AV Peak Grad:      3.7 mmHg AV Mean Grad:      2.0 mmHg LVOT Vmax:         64.70 cm/s LVOT Vmean:        43.400 cm/s LVOT VTI:          0.150 m LVOT/AV  VTI ratio: 0.82 AI PHT:            786 msec  AORTA Ao Root diam: 3.70 cm Ao Asc diam:  3.25 cm MITRAL VALVE MV Area (PHT):             SHUNTS MV Decel Time:             Systemic VTI:  0.15 m MV E velocity: 60.30 cm/s  Systemic Diam: 2.00 cm MV A velocity: 41.90 cm/s MV E/A ratio:  1.44 Mihai Croitoru MD Electronically signed by Jerel Balding MD Signature Date/Time: 01/08/2024/12:57:10 PM    Final     Microbiology: Results for orders placed or performed during the hospital encounter of 01/27/24  Culture, blood (Routine x 2)     Status: None (Preliminary result)   Collection Time: 01/27/24 11:18 PM   Specimen: BLOOD  Result Value Ref Range Status   Specimen Description   Final    BLOOD BLOOD LEFT HAND Performed at [facility], [address], [place], [place] [address]    Special Requests   Final    BOTTLES DRAWN AEROBIC AND ANAEROBIC Blood  Culture adequate volume Performed at [facility], [address] [place], [place] [address]    Culture   Final    NO GROWTH 4 DAYS Performed at [facility], [address] [place], [place] [address]    Report Status PENDING  Incomplete  Resp panel by RT-PCR (RSV, Flu A&B, Covid) Anterior Nasal Swab     Status: None   Collection Time: 01/27/24 11:18 PM   Specimen: Anterior Nasal Swab  Result Value Ref Range Status   SARS Coronavirus 2 by RT PCR NEGATIVE NEGATIVE Final    Comment: (NOTE) SARS-CoV-2 target nucleic acids are NOT DETECTED.  The SARS-CoV-2 RNA is generally detectable in upper respiratory specimens during the acute phase of infection. The lowest concentration of SARS-CoV-2 viral copies this assay can detect is 138 copies/mL. A negative result does not preclude SARS-Cov-2 infection and should not be used as the sole basis for treatment or other patient management decisions. A negative result may occur with  improper specimen collection/handling, submission of specimen other than nasopharyngeal swab, presence of viral mutation(s) within the areas targeted by this assay, and inadequate number of viral copies(<138 copies/mL). A negative result must be combined with clinical observations, patient history, and epidemiological information. The expected result is Negative.  Fact Sheet for Patients:  BloggerCourse.com  Fact Sheet for Healthcare Providers:  SeriousBroker.it  This test is no t yet approved or cleared by the [place]  FDA and  has been authorized for detection and/or diagnosis of SARS-CoV-2 by FDA under an Emergency Use Authorization (EUA). This EUA will remain  in effect (meaning this test can be used) for the duration of the COVID-19 declaration under Section 564(b)(1) of the Act, 21 U.S.C.section 360bbb-3(b)(1), unless the authorization is terminated  or revoked sooner.        Influenza A by PCR NEGATIVE NEGATIVE Final   Influenza B by PCR NEGATIVE NEGATIVE Final    Comment: (NOTE) The Xpert Xpress SARS-CoV-2/FLU/RSV plus assay is intended as an aid in the diagnosis of influenza from Nasopharyngeal swab specimens and should not be used as a sole basis for treatment. Nasal washings and aspirates are unacceptable for Xpert Xpress SARS-CoV-2/FLU/RSV testing.  Fact Sheet for Patients: BloggerCourse.com  Fact Sheet for Healthcare Providers: SeriousBroker.it  This test is not yet approved or cleared by the [place]   FDA and has been authorized for detection and/or diagnosis of SARS-CoV-2 by FDA under an Emergency Use Authorization (EUA). This EUA will remain in effect (meaning this test can be used) for the duration of the COVID-19 declaration under Section 564(b)(1) of the Act, 21 U.S.C. section 360bbb-3(b)(1), unless the authorization is terminated or revoked.     Resp Syncytial Virus by PCR NEGATIVE NEGATIVE Final    Comment: (NOTE) Fact Sheet for Patients: BloggerCourse.com  Fact Sheet for Healthcare Providers: SeriousBroker.it  This test is not yet approved or cleared by the [place]  FDA and has been authorized for detection and/or diagnosis of SARS-CoV-2 by FDA under an Emergency Use Authorization (EUA). This EUA will remain in effect (meaning this test can be used) for the duration of the COVID-19 declaration under Section 564(b)(1) of the Act, 21 U.S.C. section 360bbb-3(b)(1), unless the authorization is terminated or revoked.  Performed at [facility], [address] [place], [place] [address]   Culture, blood (Routine x 2)     Status: None (Preliminary result)   Collection Time: 01/28/24  2:18 AM   Specimen: BLOOD  Result Value Ref Range Status   Specimen Description   Final    BLOOD BLOOD RIGHT  FOREARM Performed at [facility], [address] [place], [place] [address]    Special Requests   Final    BOTTLES DRAWN AEROBIC AND ANAEROBIC Blood Culture adequate volume Performed at [facility], [address], [place], [place] [address]    Culture   Final    NO GROWTH 4 DAYS Performed at [facility], [address] [place], [place] [address]    Report Status PENDING  Incomplete  MRSA Next Gen by PCR, Nasal     Status: None   Collection Time: 01/28/24  6:13 AM   Specimen: Nasal Mucosa; Nasal Swab  Result Value Ref Range Status   MRSA by PCR Next Gen NOT DETECTED NOT DETECTED Final    Comment: (NOTE) The GeneXpert MRSA Assay (FDA approved for NASAL specimens only), is one component of a comprehensive MRSA colonization surveillance program. It is not intended to diagnose MRSA infection nor to guide or monitor treatment for MRSA infections. Test performance is not FDA approved in patients less than 80 years old. Performed at [facility], [address] [place], [place] [address]   Urine Culture (for pregnant, neutropenic or urologic patients or patients with an indwelling urinary catheter)     Status: None   Collection Time: 01/28/24  8:36 AM   Specimen: Urine, Catheterized  Result Value Ref Range Status   Specimen Description URINE, CATHETERIZED  Final   Special Requests NONE  Final   Culture   Final    NO GROWTH Performed at [facility], [address], [place], [place] [address]    Report Status 01/29/2024 FINAL  Final  Respiratory (~20 pathogens) panel by PCR     Status: None   Collection Time: 01/29/24 11:49 AM   Specimen: Nasopharyngeal Swab; Respiratory  Result Value Ref Range Status   Adenovirus NOT DETECTED NOT DETECTED Final   Coronavirus 229E NOT DETECTED NOT DETECTED Final    Comment: (NOTE) The Coronavirus on the Respiratory Panel, DOES NOT test for the novel  Coronavirus (2019 nCoV)     Coronavirus HKU1 NOT DETECTED NOT DETECTED Final   Coronavirus NL63 NOT DETECTED NOT DETECTED Final   Coronavirus OC43 NOT DETECTED NOT DETECTED Final   Metapneumovirus NOT DETECTED  NOT DETECTED Final   Rhinovirus / Enterovirus NOT DETECTED NOT DETECTED Final   Influenza A NOT DETECTED NOT DETECTED Final   Influenza B NOT DETECTED NOT DETECTED Final   Parainfluenza Virus 1 NOT DETECTED NOT DETECTED Final   Parainfluenza Virus 2 NOT DETECTED NOT DETECTED Final   Parainfluenza Virus 3 NOT DETECTED NOT DETECTED Final   Parainfluenza Virus 4 NOT DETECTED NOT DETECTED Final   Respiratory Syncytial Virus NOT DETECTED NOT DETECTED Final   Bordetella pertussis NOT DETECTED NOT DETECTED Final   Bordetella Parapertussis NOT DETECTED NOT DETECTED Final   Chlamydophila pneumoniae NOT DETECTED NOT DETECTED Final   Mycoplasma pneumoniae NOT DETECTED NOT DETECTED Final    Comment: Performed at [facility], [address] [address] [place], [place] [address]    Labs: CBC: Recent Labs  Lab 01/27/24 2319 01/29/24 0326 01/30/24 0939 01/31/24 0344 02/01/24 0126  WBC 17.5* 16.0* 11.7* 8.9 7.1  NEUTROABS 14.9*  --   --   --   --   HGB 13.6 11.2* 11.6* 11.5* 11.9*  HCT 39.8 35.2* 35.4* 35.4* 37.3*  MCV 88.2 93.4 91.0 91.0 92.1  PLT 207 168 192 202 226   Basic Metabolic Panel: Recent Labs  Lab 01/28/24 1226 01/29/24 0326 01/30/24 0939 01/31/24 0344 02/01/24 0126  NA 136 138 137 138 140  K 4.4 3.6 3.6 3.3* 3.8  CL 102 106 104 100 98  CO2 24 21* 23 29 28   GLUCOSE 138* 107* 102* 101* 93  BUN 7 7 7  5* 8  CREATININE 0.60* 0.63 0.43* 0.65 0.55*  CALCIUM  8.8* 8.5* 8.6* 8.3* 8.7*   Liver Function Tests: Recent Labs  Lab 01/27/24 2319  AST 24  ALT 11  ALKPHOS 64  BILITOT 0.8  PROT 7.1  ALBUMIN 4.3   CBG: Recent Labs  Lab 01/29/24 2140  GLUCAP 121*    Discharge time spent: 36 minutes.  Signed: Concepcion Riser, MD [facility] 02/01/2024

## 2024-02-01 NOTE — Progress Notes (Addendum)
  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

## 2024-02-01 NOTE — Progress Notes (Signed)
 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

## 2024-02-02 ENCOUNTER — Inpatient Hospital Stay (HOSPITAL_COMMUNITY)
Admission: EM | Admit: 2024-02-02 | Discharge: 2024-02-05 | DRG: 291 | Disposition: A | Discharge status: Home Health | Attending: Internal Medicine | Admitting: Internal Medicine

## 2024-02-02 ENCOUNTER — Other Ambulatory Visit: Payer: Self-pay

## 2024-02-02 ENCOUNTER — Encounter (HOSPITAL_COMMUNITY): Payer: Self-pay | Admitting: Radiology

## 2024-02-02 ENCOUNTER — Emergency Department (HOSPITAL_COMMUNITY)

## 2024-02-02 DIAGNOSIS — K592 Neurogenic bowel, not elsewhere classified: Secondary | ICD-10-CM | POA: Diagnosis present

## 2024-02-02 DIAGNOSIS — I5033 Acute on chronic diastolic (congestive) heart failure: Principal | ICD-10-CM | POA: Diagnosis present

## 2024-02-02 DIAGNOSIS — Z993 Dependence on wheelchair: Secondary | ICD-10-CM | POA: Diagnosis not present

## 2024-02-02 DIAGNOSIS — K5909 Other constipation: Secondary | ICD-10-CM | POA: Diagnosis present

## 2024-02-02 DIAGNOSIS — Z8249 Family history of ischemic heart disease and other diseases of the circulatory system: Secondary | ICD-10-CM

## 2024-02-02 DIAGNOSIS — G8220 Paraplegia, unspecified: Secondary | ICD-10-CM | POA: Diagnosis present

## 2024-02-02 DIAGNOSIS — I5043 Acute on chronic combined systolic (congestive) and diastolic (congestive) heart failure: Principal | ICD-10-CM | POA: Diagnosis present

## 2024-02-02 DIAGNOSIS — Z79899 Other long term (current) drug therapy: Secondary | ICD-10-CM | POA: Diagnosis not present

## 2024-02-02 DIAGNOSIS — N319 Neuromuscular dysfunction of bladder, unspecified: Secondary | ICD-10-CM | POA: Diagnosis present

## 2024-02-02 DIAGNOSIS — Q051 Thoracic spina bifida with hydrocephalus: Secondary | ICD-10-CM | POA: Diagnosis not present

## 2024-02-02 DIAGNOSIS — Q2381 Bicuspid aortic valve: Secondary | ICD-10-CM

## 2024-02-02 DIAGNOSIS — K219 Gastro-esophageal reflux disease without esophagitis: Secondary | ICD-10-CM | POA: Diagnosis present

## 2024-02-02 DIAGNOSIS — D649 Anemia, unspecified: Secondary | ICD-10-CM | POA: Diagnosis present

## 2024-02-02 DIAGNOSIS — R601 Generalized edema: Secondary | ICD-10-CM | POA: Diagnosis present

## 2024-02-02 DIAGNOSIS — M4120 Other idiopathic scoliosis, site unspecified: Secondary | ICD-10-CM | POA: Diagnosis present

## 2024-02-02 DIAGNOSIS — J9601 Acute respiratory failure with hypoxia: Secondary | ICD-10-CM | POA: Diagnosis present

## 2024-02-02 LAB — CBC WITH DIFFERENTIAL/PLATELET
Abs Immature Granulocytes: 0.12 K/uL — ABNORMAL HIGH (ref 0.00–0.07)
Basophils Absolute: 0.1 K/uL (ref 0.0–0.1)
Basophils Relative: 1 %
Eosinophils Absolute: 0.5 K/uL (ref 0.0–0.5)
Eosinophils Relative: 6 %
HCT: 42.3 % (ref 39.0–52.0)
Hemoglobin: 13.2 g/dL (ref 13.0–17.0)
Immature Granulocytes: 2 %
Lymphocytes Relative: 19 %
Lymphs Abs: 1.5 K/uL (ref 0.7–4.0)
MCH: 29.1 pg (ref 26.0–34.0)
MCHC: 31.2 g/dL (ref 30.0–36.0)
MCV: 93.4 fL (ref 80.0–100.0)
Monocytes Absolute: 1 K/uL (ref 0.1–1.0)
Monocytes Relative: 12 %
Neutro Abs: 5 K/uL (ref 1.7–7.7)
Neutrophils Relative %: 60 %
Platelets: 294 K/uL (ref 150–400)
RBC: 4.53 MIL/uL (ref 4.22–5.81)
RDW: 13.2 % (ref 11.5–15.5)
WBC: 8.2 K/uL (ref 4.0–10.5)
nRBC: 0 % (ref 0.0–0.2)

## 2024-02-02 LAB — COMPREHENSIVE METABOLIC PANEL WITH GFR
ALT: 56 U/L — ABNORMAL HIGH (ref 0–44)
AST: 58 U/L — ABNORMAL HIGH (ref 15–41)
Albumin: 3.4 g/dL — ABNORMAL LOW (ref 3.5–5.0)
Alkaline Phosphatase: 63 U/L (ref 38–126)
Anion gap: 13 (ref 5–15)
BUN: 10 mg/dL (ref 6–20)
CO2: 31 mmol/L (ref 22–32)
Calcium: 10 mg/dL (ref 8.9–10.3)
Chloride: 96 mmol/L — ABNORMAL LOW (ref 98–111)
Creatinine, Ser: 0.6 mg/dL — ABNORMAL LOW (ref 0.61–1.24)
GFR, Estimated: >60 mL/min (ref >60)
Glucose, Bld: 110 mg/dL — ABNORMAL HIGH (ref 70–99)
Potassium: 3.9 mmol/L (ref 3.5–5.1)
Sodium: 140 mmol/L (ref 135–145)
Total Bilirubin: 0.4 mg/dL (ref 0.0–1.2)
Total Protein: 7.2 g/dL (ref 6.5–8.1)

## 2024-02-02 LAB — CULTURE, BLOOD (ROUTINE X 2)
Culture: NO GROWTH
Culture: NO GROWTH
Special Requests: ADEQUATE
Special Requests: ADEQUATE

## 2024-02-02 LAB — URINALYSIS, ROUTINE W REFLEX MICROSCOPIC
Bilirubin Urine: NEGATIVE
Glucose, UA: NEGATIVE mg/dL
Hgb urine dipstick: NEGATIVE
Ketones, ur: NEGATIVE mg/dL
Leukocytes,Ua: NEGATIVE
Nitrite: NEGATIVE
Protein, ur: NEGATIVE mg/dL
Specific Gravity, Urine: 1.012 (ref 1.005–1.030)
pH: 7 (ref 5.0–8.0)

## 2024-02-02 LAB — LIPASE, BLOOD: Lipase: 38 U/L (ref 11–51)

## 2024-02-02 LAB — BRAIN NATRIURETIC PEPTIDE: B Natriuretic Peptide: 34.6 pg/mL (ref 0.0–100.0)

## 2024-02-02 MED ORDER — ONDANSETRON HCL 4 MG PO TABS: 4.0000 mg | ORAL_TABLET | Freq: Four times a day (QID) | ORAL | Status: DC | PRN

## 2024-02-02 MED ORDER — POTASSIUM CHLORIDE CRYS ER 20 MEQ PO TBCR
20.0000 meq | EXTENDED_RELEASE_TABLET | Freq: Every day | ORAL | Status: DC
  Administered 2024-02-03 – 2024-02-05 (×3): 20 meq via ORAL
  Filled 2024-02-02 (×3): qty 1

## 2024-02-02 MED ORDER — LOSARTAN POTASSIUM 25 MG PO TABS
12.5000 mg | ORAL_TABLET | Freq: Every day | ORAL | Status: DC
  Administered 2024-02-03 – 2024-02-05 (×3): 12.5 mg via ORAL
  Filled 2024-02-02 (×3): qty 1

## 2024-02-02 MED ORDER — FUROSEMIDE 10 MG/ML IJ SOLN
40.0000 mg | Freq: Once | INTRAMUSCULAR | Status: AC
  Administered 2024-02-02: 40 mg via INTRAVENOUS
  Filled 2024-02-02: qty 4

## 2024-02-02 MED ORDER — LORATADINE 10 MG PO TABS
10.0000 mg | ORAL_TABLET | Freq: Every day | ORAL | Status: DC
  Administered 2024-02-03 – 2024-02-05 (×3): 10 mg via ORAL
  Filled 2024-02-02 (×3): qty 1

## 2024-02-02 MED ORDER — SODIUM CHLORIDE 0.9 % IV SOLN: 250.0000 mL | INTRAVENOUS | Status: AC | PRN

## 2024-02-02 MED ORDER — POLYETHYLENE GLYCOL 3350 17 G PO PACK: 17.0000 g | PACK | Freq: Every day | ORAL | Status: DC | PRN

## 2024-02-02 MED ORDER — METOPROLOL SUCCINATE ER 50 MG PO TB24
50.0000 mg | ORAL_TABLET | Freq: Every day | ORAL | Status: DC
  Administered 2024-02-03 – 2024-02-05 (×3): 50 mg via ORAL
  Filled 2024-02-02 (×3): qty 1

## 2024-02-02 MED ORDER — SODIUM CHLORIDE 0.9% FLUSH
3.0000 mL | Freq: Two times a day (BID) | INTRAVENOUS | Status: DC
  Administered 2024-02-03 – 2024-02-05 (×5): 3 mL via INTRAVENOUS

## 2024-02-02 MED ORDER — GUAIFENESIN ER 600 MG PO TB12
600.0000 mg | ORAL_TABLET | Freq: Two times a day (BID) | ORAL | Status: DC
  Administered 2024-02-03 – 2024-02-05 (×6): 600 mg via ORAL
  Filled 2024-02-02 (×6): qty 1

## 2024-02-02 MED ORDER — AMOXICILLIN-POT CLAVULANATE 875-125 MG PO TABS
1.0000 | ORAL_TABLET | Freq: Two times a day (BID) | ORAL | Status: DC
  Administered 2024-02-03 (×2): 1 via ORAL
  Filled 2024-02-02 (×2): qty 1

## 2024-02-02 MED ORDER — SPIRONOLACTONE 12.5 MG HALF TABLET
12.5000 mg | ORAL_TABLET | Freq: Every day | ORAL | Status: DC
Start: 2024-02-03 — End: 2024-02-05
  Administered 2024-02-03 – 2024-02-05 (×3): 12.5 mg via ORAL
  Filled 2024-02-02 (×3): qty 1

## 2024-02-02 MED ORDER — ACETAMINOPHEN 325 MG PO TABS: 650.0000 mg | ORAL_TABLET | Freq: Four times a day (QID) | ORAL | Status: DC | PRN

## 2024-02-02 MED ORDER — ONDANSETRON HCL 4 MG/2ML IJ SOLN: 4.0000 mg | Freq: Four times a day (QID) | INTRAMUSCULAR | Status: DC | PRN

## 2024-02-02 MED ORDER — DOCUSATE SODIUM 100 MG PO CAPS: 100.0000 mg | ORAL_CAPSULE | Freq: Two times a day (BID) | ORAL | Status: DC | PRN

## 2024-02-02 MED ORDER — OXYBUTYNIN CHLORIDE ER 5 MG PO TB24
15.0000 mg | ORAL_TABLET | Freq: Every day | ORAL | Status: DC
  Administered 2024-02-03 – 2024-02-04 (×3): 15 mg via ORAL
  Filled 2024-02-02 (×4): qty 1

## 2024-02-02 MED ORDER — HYDROCODONE-ACETAMINOPHEN 5-325 MG PO TABS: 1.0000 | ORAL_TABLET | ORAL | Status: DC | PRN

## 2024-02-02 MED ORDER — SODIUM CHLORIDE 0.9% FLUSH: 3.0000 mL | INTRAVENOUS | Status: DC | PRN

## 2024-02-02 MED ORDER — ALBUTEROL SULFATE (2.5 MG/3ML) 0.083% IN NEBU: 2.5000 mg | INHALATION_SOLUTION | RESPIRATORY_TRACT | Status: DC | PRN

## 2024-02-02 MED ORDER — FUROSEMIDE 10 MG/ML IJ SOLN: 40.0000 mg | Freq: Every day | INTRAMUSCULAR | Status: DC

## 2024-02-02 MED ORDER — ACETAMINOPHEN 650 MG RE SUPP
650.0000 mg | Freq: Four times a day (QID) | RECTAL | Status: DC | PRN
Start: 2024-02-02 — End: 2024-02-05

## 2024-02-02 NOTE — ED Triage Notes (Signed)
 Pt complaining of fluid build up in his upper legs and scrotum. He self caths and they are having difficulty cathing due to the swelling. They endorse the swelling was there when he was discharged. He was also unable to do his bowel program today due to the abd swelling.

## 2024-02-02 NOTE — ED Provider Triage Note (Signed)
 Emergency Medicine Provider Triage Evaluation Note  Samuel Becker , a 33 y.o. male  was evaluated in triage.  Pt complains of groin wound.  Patient reported his discharge yesterday after being admitted for sepsis.  Reportedly had a lot of fluids given to go home due to issues of hypotension and has now developed significant swelling in his abdomen, pelvis, and scrotum.  He is having difficult catheterization due to the mount of swelling present.  Reported fevers, chills, body aches.  Does endorse some increased difficulty breathing due to the swelling..  Review of Systems  Positive: As above Negative: As above  Physical Exam  BP (!) 175/116 (BP Location: Right Arm)   Pulse (!) 116   Temp 98.2 F (36.8 C) (Oral)   Resp 20   Ht 5' 4 (1.626 m)   Wt 54 kg   SpO2 98% Comment: Simultaneous filing. User may not have seen previous data.  BMI 20.43 kg/m  Gen:   Awake, no distress   Resp:  Normal effort, wheezing in all lung fields MSK:   Moves extremities without difficulty  Other:  Patient wearing compression stockings difficult to assess swelling in the lower extremities but does report that his thighs are notably swollen compared to baseline.  Medical Decision Making  Medically screening exam initiated at 4:45 PM.  Appropriate orders placed.  DELRAY REZA was informed that the remainder of the evaluation will be completed by another provider, this initial triage assessment does not replace that evaluation, and the importance of remaining in the ED until their evaluation is complete.     Launi Asencio A, PA-C 02/02/24 1646

## 2024-02-02 NOTE — Assessment & Plan Note (Signed)
 Chronic stable

## 2024-02-02 NOTE — ED Provider Notes (Signed)
 [place] [facility] AT [facility] Provider Note   CSN: [id number] Arrival date & time: 02/02/24  1601     Patient presents with: Groin Swelling   Samuel Becker is a 33 y.o. male.   The history is provided by the patient, a parent and medical records. No language interpreter was used.     33 year old male with history of spina bifida, paraplegia, neurogenic bladder for which he self cath, wheelchair dependence presenting with complaints of scrotal swelling.  History obtained through patient and through family member who is at bedside. A week ago patient was admitted for septic shock requiring pressors and admitted to the ICU and found to have possible pneumonia.  He was subsequently discharged home with supplemental oxygen as needed as well as antibiotics and nebulizer machine and BiPAP at night.  Patient was discharged home yesterday however he returns today due to concerns of significant scrotal swelling and difficulty performing his usual bowel regimen and to self cath.  Since the patient was hospitalized he received quite a bit of fluid and afterward his abdomen is quite distended that he is unable to safely self cath.  He also complained of significant swelling to his scrotum as well in his legs.  His breathing is improved and he denies any fever.  No burning urination.  Patient is having trouble perform his bowel regimen which include rectal flushing.  He does not feel comfortable staying at home at this time.  Prior to Admission medications   Medication Sig Start Date End Date Taking? Authorizing Provider  acetaminophen  (TYLENOL ) 325 MG tablet Take 325 mg by mouth as needed for moderate pain (pain score 4-6) or mild pain (pain score 1-3).    [provider]  amoxicillin -clavulanate (AUGMENTIN ) 875-125 MG tablet Take 1 tablet by mouth 2 (two) times daily for 5 days. 02/01/24 02/06/24  Darci Pore, MD  calcium  carbonate (SUPER CALCIUM ) 1500  (600 Ca) MG TABS tablet Take 600 mg of elemental calcium  by mouth daily with breakfast.    [provider]  cetirizine (ZYRTEC) 10 MG tablet Take 10 mg by mouth daily.    [provider]  docusate sodium  (COLACE) 100 MG capsule Take 1 capsule (100 mg total) by mouth 2 (two) times daily as needed for mild constipation. 02/01/24   Darci Pore, MD  furosemide  (LASIX ) 40 MG tablet Take 1 tablet (40 mg total) by mouth daily. You may take an additional 40 mg dose for leg swelling or shortness of breath in the evening. 11/05/23 02/03/24  Shlomo Wilbert SAUNDERS, MD  losartan  (COZAAR ) 25 MG tablet Take 0.5 tablets (12.5 mg total) by mouth daily. 01/24/24 04/23/24  Shlomo Wilbert SAUNDERS, MD  Lubricants (SURGICAL LUBRICANT) gel Apply 1 Application topically as needed (for cath). 01/04/24   [provider]  metoprolol  succinate (TOPROL  XL) 50 MG 24 hr tablet Take 1 tablet (50 mg total) by mouth daily. 08/22/23   Ladona Heinz, MD  Multiple Vitamins-Minerals (MULTIVITAMIN WITH MINERALS) tablet Take 1 tablet by mouth daily.    [provider]  oxybutynin  (DITROPAN  XL) 15 MG 24 hr tablet Take 15 mg by mouth at bedtime.    [provider]  polyethylene glycol (MIRALAX  / GLYCOLAX ) 17 g packet Take 17 g by mouth daily as needed for moderate constipation. 02/01/24   Darci Pore, MD  Potassium Chloride  ER 20 MEQ TBCR Take 1 tablet (20 mEq total) by mouth daily. 11/14/23   Lelon Hamilton T, PA-C  spironolactone  (ALDACTONE )  25 MG tablet Take 0.5 tablets (12.5 mg total) by mouth daily. 01/09/24   Lelon Hamilton T, PA-C  VITAMIN D PO Take 1 capsule by mouth daily.    [provider]    Allergies: Morphine and codeine, Septra [sulfamethoxazole-trimethoprim], Sulfamethoxazole, Trimethoprim, and Latex    Review of Systems  All other systems reviewed and are negative.   Updated Vital Signs BP (!) 175/116 (BP Location: Right Arm)   Pulse (!) 116   Temp 98.2 F (36.8 C) (Oral)    Resp 20   Ht 5' 4 (1.626 m)   Wt 54 kg   SpO2 98% Comment: Simultaneous filing. User may not have seen previous data.  BMI 20.43 kg/m   Physical Exam Constitutional:      General: He is not in acute distress.    Appearance: He is well-developed.     Comments: Appears more than stated age  HENT:     Head: Atraumatic.  Eyes:     Conjunctiva/sclera: Conjunctivae normal.  Cardiovascular:     Rate and Rhythm: Tachycardia present.     Pulses: Normal pulses.     Heart sounds: Normal heart sounds.  Pulmonary:     Breath sounds: No wheezing, rhonchi or rales.  Abdominal:     General: There is distension.     Comments: Abdomen is markedly distended with signs of anasarca  Genitourinary:    Comments: Chaperone present during exam.  Scrotal edema noted.  Edema to penile shaft noted as well. Musculoskeletal:     Cervical back: Normal range of motion and neck supple.  Skin:    Findings: No rash.  Neurological:     Mental Status: He is alert. Mental status is at baseline.     (all labs ordered are listed, but only abnormal results are displayed) Labs Reviewed  CBC WITH DIFFERENTIAL/PLATELET - Abnormal; Notable for the following components:      Result Value   Abs Immature Granulocytes 0.12 (*)    All other components within normal limits  COMPREHENSIVE METABOLIC PANEL WITH GFR - Abnormal; Notable for the following components:   Chloride 96 (*)    Glucose, Bld 110 (*)    Creatinine, Ser 0.60 (*)    Albumin 3.4 (*)    AST 58 (*)    ALT 56 (*)    All other components within normal limits  URINALYSIS, ROUTINE W REFLEX MICROSCOPIC - Abnormal; Notable for the following components:   APPearance CLOUDY (*)    All other components within normal limits  LIPASE, BLOOD  BRAIN NATRIURETIC PEPTIDE    EKG: None  Radiology: DG Chest Portable 1 View Result Date: 02/02/2024 CLINICAL DATA:  Short of breath.  Groin swelling. EXAM: PORTABLE CHEST 1 VIEW COMPARISON:  Radiograph 01/28/2024  FINDINGS: Posterior thoracolumbar fusion. Severe scoliosis noted. VP shunt catheter along the RIGHT chest wall. Low lung volumes. Mild venous congestion. No pneumothorax. No focal consolidation. IMPRESSION: Low lung volumes and mild venous congestion. Electronically Signed   By: Jackquline Boxer M.D.   On: 02/02/2024 17:10     Procedures   Medications Ordered in the ED - No data to display                                  Medical Decision Making Risk Prescription drug management.   BP 124/83   Pulse (!) 105   Temp 98.2 F (36.8 C) (Oral)   Resp 17  Ht 5' 4 (1.626 m)   Wt 54 kg   SpO2 100%   BMI 20.43 kg/m   53:53 PM  33 year old male with history of spina bifida, paraplegia, neurogenic bladder for which he self cath, wheelchair dependence presenting with complaints of scrotal swelling.  History obtained through patient and through family member who is at bedside. A week ago patient was admitted for septic shock requiring pressors and admitted to the ICU and found to have possible pneumonia.  He was subsequently discharged home with supplemental oxygen as needed as well as antibiotics and nebulizer machine and BiPAP at night.  Patient was discharged home yesterday however he returns today due to concerns of significant scrotal swelling and difficulty performing his usual bowel regimen and to self cath.  Since the patient was hospitalized he received quite a bit of fluid and afterward his abdomen is quite distended that he is unable to safely self cath.  He also complained of significant swelling to his scrotum as well in his legs.  His breathing is improved and he denies any fever.  No burning urination.  Patient is having trouble perform his bowel regimen which include rectal flushing.  He does not feel comfortable staying at home at this time.  On exam patient is more than stated age due to spina bifida.  He has a fairly distended abdomen as well as scrotal edema and bilateral thigh  edema consistent with anasarca.  He is mildly tachycardic but he is not hypoxic.  He is wearing supplemental oxygen.  -Labs ordered, independently viewed and interpreted by me.  Labs remarkable for reassuring labs -The patient was maintained on a cardiac monitor.  I personally viewed and interpreted the cardiac monitored which showed an underlying rhythm of: sinus tachycardia -Imaging independently viewed and interpreted by me and I agree with radiologist's interpretation.  Result remarkable for CXR showing low lung volume and mild venous congestion -This patient presents to the ED for concern of swelling, this involves an extensive number of treatment options, and is a complaint that carries with it a high risk of complications and morbidity.  The differential diagnosis includes anasarca, CHF -Co morbidities that complicate the patient evaluation includes spinal bifida -Treatment includes foley insertion -Reevaluation of the patient after these medicines showed that the patient improved -PCP office notes or outside notes reviewed -Discussion with specialist [facility] Dr. Silvester who agrees to admit pt for further management of anasarca -Escalation to admission/observation considered: patient agree with admission  Due to anasarca from recent hospitalization when patient suffered sepsis shock and received significant out of IV fluid and he is unable to care for himself at home and unable to perform his daily bowel regimen perform In-N-Out cath to help urinate.  With that in mind, patient will be admitted with Foley insertion and further management by hospitalist.      Final diagnoses:  Anasarca    ED Discharge Orders     None          Nivia Colon, PA-C 02/02/24 2129    Dean Clarity, MD 02/02/24 2216

## 2024-02-02 NOTE — H&P (Signed)
 Samuel Becker FMW:[id number] DOB: 1990-07-25 DOA: 02/02/2024     PCP: Shlomo Wilbert SAUNDERS, MD   Outpatient Specialists:   CARDS:  Dr. Wilbert Shlomo, MD   Pulmonary   Dr. Kassie     Patient arrived to ER on 02/02/24 at 1601 Referred by Attending Silvester Ales, MD   Patient coming from:    home Lives With family      Chief Complaint:   Chief Complaint  Patient presents with   Groin Swelling    HPI: Samuel Becker is a 33 y.o. male with medical history significant of bifida, functional paraplegia, neurogenic bladder, GERD     Presented with   increased fluid retention Patient presented back to emergency department after he was discharged yesterday reporting his he has had increased fluid in his legs and scrotum He needs to self cath and he has been having difficulty with that He has been having difficulty with ambulation and bowel program including rectal flushing secondary to increased fluid retention He also have had a bit increase trouble breathing secondary to increased swelling Patient recently admitted for septic shock of unclear origin he has history of spina bifida paraplegic with neurogenic bladder which requires self-catheterization During recent admission he required pressors He was treated with vancomycin  Rocephin  and azithromycin  At baseline he is on oxygen as needed and has BiPAP at nighttime  He was discharged home on Augmentin  and Lasix     Has been on 2L of o2 since admit   Denies significant ETOH intake   Does not smoke        Regarding pertinent Chronic problems:       chronic CHF diastolic/systolic/ combined - last echo  Recent Results (from the past 56199 hours)  ECHOCARDIOGRAM COMPLETE   Collection Time: 01/08/24  9:49 AM  Result Value   Area-P 1/2 2.99   S' Lateral 2.70   AV Area mean vel 2.20   AR max vel 2.12   AV Area VTI 2.58   P 1/2 time 786   Ao pk vel 0.96   AV Mean grad 2.0   AV Peak grad 3.7   Est EF 50 - 55%    Narrative      ECHOCARDIOGRAM REPORT        IMPRESSIONS    1. Left ventricular ejection fraction, by estimation, is 50 to 55%. The left ventricle has low normal function. The left ventricle has no regional wall motion abnormalities. Left ventricular diastolic parameters were normal. The average left ventricular  global longitudinal strain is -17.6 %. The global longitudinal strain is abnormal.  2. Right ventricular systolic function is normal. The right ventricular size is normal.  3. The mitral valve is normal in structure. No evidence of mitral valve regurgitation. No evidence of mitral stenosis.  4. Sievers type 1 bicuspid aortic valve with left-right cusp raphe. The aortic valve is bicuspid. Aortic valve regurgitation is mild. No aortic stenosis is present.  5. Aortic dilatation noted. There is mild dilatation of the aortic root, measuring 40 mm.  6. The inferior vena cava is normal in size with greater than 50% respiratory variability, suggesting right atrial pressure of 3 mmHg.           Chronic anemia - baseline hg Hemoglobin & Hematocrit  Recent Labs    01/31/24 0344 02/01/24 0126 02/02/24 1656  HGB 11.5* 11.9* 13.2   Iron/TIBC/Ferritin/ %Sat    Component Value Date/Time   FERRITIN 35 10/03/2022 1506  While in ER:    Foley placed    Lab Orders         CBC with Differential         Comprehensive metabolic panel         Lipase, blood         Urinalysis, Routine w reflex microscopic -Urine, Clean Catch         Brain natriuretic peptide      CXR - Low lung volumes and mild venous congestion.     Following Medications were ordered in ER: Medications  furosemide  (LASIX ) injection 40 mg (40 mg Intravenous Given 02/02/24 2054)       ED Triage Vitals [02/02/24 1614]  Encounter Vitals Group     BP (!) 175/116     Girls Systolic BP Percentile      Girls Diastolic BP Percentile      Boys Systolic BP Percentile      Boys Diastolic BP Percentile      Pulse Rate  (!) 116     Resp 20     Temp 98.2 F (36.8 C)     Temp Source Oral     SpO2 98 %     Weight 119 lb (54 kg)     Height 5' 4 (1.626 m)     Head Circumference      Peak Flow      Pain Score      Pain Loc      Pain Education      Exclude from Growth Chart   UFJK(75)@     _________________________________________ Significant initial  Findings: Abnormal Labs Reviewed  CBC WITH DIFFERENTIAL/PLATELET - Abnormal; Notable for the following components:      Result Value   Abs Immature Granulocytes 0.12 (*)    All other components within normal limits  COMPREHENSIVE METABOLIC PANEL WITH GFR - Abnormal; Notable for the following components:   Chloride 96 (*)    Glucose, Bld 110 (*)    Creatinine, Ser 0.60 (*)    Albumin 3.4 (*)    AST 58 (*)    ALT 56 (*)    All other components within normal limits  URINALYSIS, ROUTINE W REFLEX MICROSCOPIC - Abnormal; Notable for the following components:   APPearance CLOUDY (*)    All other components within normal limits    ECG: Ordered   BNP (last 3 results) Recent Labs    02/02/24 1656  BNP 34.6       The recent clinical data is shown below. Vitals:   02/02/24 1614 02/02/24 1845  BP: (!) 175/116 124/83  Pulse: (!) 116 (!) 105  Resp: 20 17  Temp: 98.2 F (36.8 C)   TempSrc: Oral   SpO2: 98% 100%  Weight: 54 kg   Height: 5' 4 (1.626 m)     WBC     Component Value Date/Time   WBC 8.2 02/02/2024 1656   LYMPHSABS 1.5 02/02/2024 1656   MONOABS 1.0 02/02/2024 1656   EOSABS 0.5 02/02/2024 1656   BASOSABS 0.1 02/02/2024 1656       UA   no evidence of UTI      Urine analysis:    Component Value Date/Time   COLORURINE YELLOW 02/02/2024 1656   APPEARANCEUR CLOUDY (A) 02/02/2024 1656   LABSPEC 1.012 02/02/2024 1656   PHURINE 7.0 02/02/2024 1656   GLUCOSEU NEGATIVE 02/02/2024 1656   HGBUR NEGATIVE 02/02/2024 1656   BILIRUBINUR NEGATIVE 02/02/2024 1656   KETONESUR NEGATIVE 02/02/2024 1656  PROTEINUR NEGATIVE 02/02/2024  1656   UROBILINOGEN 0.2 02/14/2009 1924   NITRITE NEGATIVE 02/02/2024 1656   LEUKOCYTESUR NEGATIVE 02/02/2024 1656      __________________________________________________________ Recent Labs  Lab 01/29/24 0326 01/30/24 0939 01/31/24 0344 02/01/24 0126 02/02/24 1656  NA 138 137 138 140 140  K 3.6 3.6 3.3* 3.8 3.9  CO2 21* 23 29 28 31   GLUCOSE 107* 102* 101* 93 110*  BUN 7 7 5* 8 10  CREATININE 0.63 0.43* 0.65 0.55* 0.60*  CALCIUM  8.5* 8.6* 8.3* 8.7* 10.0    Cr    stable,    Lab Results  Component Value Date   CREATININE 0.60 (L) 02/02/2024   CREATININE 0.55 (L) 02/01/2024   CREATININE 0.65 01/31/2024    Recent Labs  Lab 01/27/24 2319 02/02/24 1656  AST 24 58*  ALT 11 56*  ALKPHOS 64 63  BILITOT 0.8 0.4  PROT 7.1 7.2  ALBUMIN 4.3 3.4*   Lab Results  Component Value Date   CALCIUM  10.0 02/02/2024   PHOS 4.8 (H) 10/03/2022    Plt: Lab Results  Component Value Date   PLT 294 02/02/2024       Recent Labs  Lab 01/27/24 2319 01/29/24 0326 01/30/24 0939 01/31/24 0344 02/01/24 0126 02/02/24 1656  WBC 17.5* 16.0* 11.7* 8.9 7.1 8.2  NEUTROABS 14.9*  --   --   --   --  5.0  HGB 13.6 11.2* 11.6* 11.5* 11.9* 13.2  HCT 39.8 35.2* 35.4* 35.4* 37.3* 42.3  MCV 88.2 93.4 91.0 91.0 92.1 93.4  PLT 207 168 192 202 226 294    HG/HCT   stable,     Component Value Date/Time   HGB 13.2 02/02/2024 1656   HCT 42.3 02/02/2024 1656   MCV 93.4 02/02/2024 1656     Recent Labs  Lab 02/02/24 1656  LIPASE 38   No results for input(s): AMMONIA in the last 168 hours.    _______________________________________________ Hospitalist was called for admission for   Anasarca     The following Work up has been ordered so far:  Orders Placed This Encounter  Procedures   DG Chest Portable 1 View   CBC with Differential   Comprehensive metabolic panel   Lipase, blood   Urinalysis, Routine w reflex microscopic -Urine, Clean Catch   Brain natriuretic peptide   Insert  foley catheter   Cardiac Monitoring - Continuous Indefinite   Consult to hospitalist   Admit to Inpatient (patient's expected length of stay will be greater than 2 midnights or inpatient only procedure)     OTHER Significant initial  Findings:  labs showing:     DM  labs:  HbA1C: No results for input(s): HGBA1C in the last 8760 hours.     CBG (last 3)  No results for input(s): GLUCAP in the last 72 hours.        Cultures:    Component Value Date/Time   SDES URINE, CATHETERIZED 01/28/2024 0836   SPECREQUEST NONE 01/28/2024 0836   CULT  01/28/2024 0836    NO GROWTH Performed at [facility], [address] [place], [place] [address]    REPTSTATUS 01/29/2024 FINAL 01/28/2024 0836     Radiological Exams on Admission: DG Chest Portable 1 View Result Date: 02/02/2024 CLINICAL DATA:  Short of breath.  Groin swelling. EXAM: PORTABLE CHEST 1 VIEW COMPARISON:  Radiograph 01/28/2024 FINDINGS: Posterior thoracolumbar fusion. Severe scoliosis noted. VP shunt catheter along the RIGHT chest wall. Low lung volumes. Mild venous congestion.  No pneumothorax. No focal consolidation. IMPRESSION: Low lung volumes and mild venous congestion. Electronically Signed   By: Jackquline Boxer M.D.   On: 02/02/2024 17:10   _______________________________________________________________________________________________________ Latest  Blood pressure 124/83, pulse (!) 105, temperature 98.2 F (36.8 C), temperature source Oral, resp. rate 17, height 5' 4 (1.626 m), weight 54 kg, SpO2 100%.   Vitals  labs and radiology finding personally reviewed  Review of Systems:    Pertinent positives include:  fatigue,  Constitutional:  No weight loss, night sweats, Fevers, chills,  weight loss  HEENT:  No headaches, Difficulty swallowing,Tooth/dental problems,Sore throat,  No sneezing, itching, ear ache, nasal congestion, post nasal drip,  Cardio-vascular:  No chest pain, Orthopnea, PND, anasarca,  dizziness, palpitations.no Bilateral lower extremity swelling  GI:  No heartburn, indigestion, abdominal pain, nausea, vomiting, diarrhea, change in bowel habits, loss of appetite, melena, blood in stool, hematemesis Resp:  no shortness of breath at rest. No dyspnea on exertion, No excess mucus, no productive cough, No non-productive cough, No coughing up of blood.No change in color of mucus.No wheezing. Skin:  no rash or lesions. No jaundice GU:  no dysuria, change in color of urine, no urgency or frequency. No straining to urinate.  No flank pain.  Musculoskeletal:  No joint pain or no joint swelling. No decreased range of motion. No back pain.  Psych:  No change in mood or affect. No depression or anxiety. No memory loss.  Neuro: no localizing neurological complaints, no tingling, no weakness, no double vision, no gait abnormality, no slurred speech, no confusion  All systems reviewed and apart from HOPI all are negative _______________________________________________________________________________________________ Past Medical History:   Past Medical History:  Diagnosis Date   Ascending aorta dilatation (HCC)    40 mm by 2D echo 12/2023   Bicuspid aortic valve    HFrEF (heart failure with reduced ejection fraction) (HCC) 10/02/2022   Limited TTE 12/26/2022: EF 30-35, GR 1 DD, normal RVSF, bicuspid AV, mild AI, aortic root 39 mm, RAP 3     Spina bifida (HCC)      History reviewed. No pertinent surgical history.  Social History:  Ambulatory     wheelchair bound,      reports that he has never smoked. He has never used smokeless tobacco. He reports that he does not currently use alcohol . He reports that he does not use drugs.   Family History:   Family History  Problem Relation Age of Onset   Hypertension Father    Heart disease Maternal Grandmother    Heart attack Maternal Grandfather    Heart attack Paternal Grandmother    Heart attack Paternal Grandfather 61    ______________________________________________________________________________________________ Allergies: Allergies  Allergen Reactions   Morphine And Codeine Other (See Comments)    Hallucinations    Septra [Sulfamethoxazole-Trimethoprim] Other (See Comments)    Gi upset    Sulfamethoxazole Other (See Comments)    Gi upset    Trimethoprim Other (See Comments)    Unknown    Latex Swelling and Rash     Prior to Admission medications   Medication Sig Start Date End Date Taking? Authorizing Provider  acetaminophen  (TYLENOL ) 325 MG tablet Take 325 mg by mouth as needed for moderate pain (pain score 4-6) or mild pain (pain score 1-3).    [provider]  amoxicillin -clavulanate (AUGMENTIN ) 875-125 MG tablet Take 1 tablet by mouth 2 (two) times daily for 5 days. 02/01/24 02/06/24  Darci Pore, MD  calcium  carbonate (SUPER CALCIUM ) 1500 (  600 Ca) MG TABS tablet Take 600 mg of elemental calcium  by mouth daily with breakfast.    [provider]  cetirizine (ZYRTEC) 10 MG tablet Take 10 mg by mouth daily.    [provider]  docusate sodium  (COLACE) 100 MG capsule Take 1 capsule (100 mg total) by mouth 2 (two) times daily as needed for mild constipation. 02/01/24   Darci Pore, MD  furosemide  (LASIX ) 40 MG tablet Take 1 tablet (40 mg total) by mouth daily. You may take an additional 40 mg dose for leg swelling or shortness of breath in the evening. 11/05/23 02/03/24  Shlomo Wilbert SAUNDERS, MD  losartan  (COZAAR ) 25 MG tablet Take 0.5 tablets (12.5 mg total) by mouth daily. 01/24/24 04/23/24  Shlomo Wilbert SAUNDERS, MD  Lubricants (SURGICAL LUBRICANT) gel Apply 1 Application topically as needed (for cath). 01/04/24   [provider]  metoprolol  succinate (TOPROL  XL) 50 MG 24 hr tablet Take 1 tablet (50 mg total) by mouth daily. 08/22/23   Ladona Heinz, MD  Multiple Vitamins-Minerals (MULTIVITAMIN WITH MINERALS) tablet Take 1 tablet by mouth daily.    [provider]  oxybutynin  (DITROPAN  XL) 15 MG 24 hr tablet Take 15 mg by mouth at bedtime.    [provider]  polyethylene glycol (MIRALAX  / GLYCOLAX ) 17 g packet Take 17 g by mouth daily as needed for moderate constipation. 02/01/24   Darci Pore, MD  Potassium Chloride  ER 20 MEQ TBCR Take 1 tablet (20 mEq total) by mouth daily. 11/14/23   Lelon Hamilton T, PA-C  spironolactone  (ALDACTONE ) 25 MG tablet Take 0.5 tablets (12.5 mg total) by mouth daily. 01/09/24   Lelon Hamilton T, PA-C  VITAMIN D PO Take 1 capsule by mouth daily.    [provider]    ___________________________________________________________________________________________________ Physical Exam:    02/02/2024    6:45 PM 02/02/2024    4:14 PM 02/01/2024   12:07 PM  Vitals with BMI  Height  5' 4   Weight  119 lbs   BMI  20.42   Systolic 124 175 889  Diastolic 83 116 94  Pulse 105 883 103     1. General:  in No  Acute distress   Chronically ill   -appearing 2. Psychological: Alert and   Oriented 3. Head/ENT:    Dry Mucous Membranes                          Head Non traumatic, neck supple                        Poor Dentition 4. SKIN:  decreased Skin turgor,  Skin clean Dry and intact no rash    5. Heart: Regular rate and rhythm no  Murmur, no Rub or gallop 6. Lungs:  no wheezes or crackles   7. Abdomen: Soft,  non-tender,  distended bowel sounds distant 8. Lower extremities: no clubbing, cyanosis, 2+ edema 9. Neurologically decreased strength in lower ext  10. MSK: Normal range of motion    Chart has been reviewed  ______________________________________________________________________________________________  Assessment/Plan a 33 y.o. 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)  Acute hypoxemic respiratory failure (HCC)  Acute on  chronic diastolic CHF (congestive heart failure) (HCC)    Idiopathic scoliosis and kyphoscoliosis Chronic stable  Neurogenic bladder Will need to insert foley until able to diurese Given anasarca would be difficult to continue with in/out for now  Neurogenic bowel Continue bowel regiment Tap water enema every other day  Paraplegia (HCC) Chronic stable   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  Hold off on echo had recent      Other plan as per orders.  DVT prophylaxis:  SCD      Code Status:    Code Status: Prior FULL CODE   as per patient  I had personally discussed CODE STATUS with patient and family    ACP   none     Family Communication:   Family  at  Bedside  plan of care was discussed  with  father, mother  Diet  heart healthy   Disposition Plan:       To home once workup is complete and patient is stable   Following barriers for discharge:                                                         Edema improves       Consult Orders  (From admission, onward)           Start     Ordered   02/02/24 2038  Consult to hospitalist  Pg by Elspeth  Once       Provider:  (Not yet assigned)  Question Answer Comment  Place call to: [facility]   Reason for Consult Admit      02/02/24 2037                               Consults called:    NONE   Admission status:  ED Disposition     ED Disposition  Admit    Condition  --   Comment  Hospital [facility] [[id number]]  Level of Care: Progressive [102]  Admit to Progressive based on following criteria: CARDIOVASCULAR & THORACIC of moderate stability with acute coronary syndrome symptoms/low risk myocardial infarction/hypertensive urgency/arrhythmias/heart failure potentially compromising stability and stable post cardiovascular intervention patients.  May admit patient to [facility] or [facility] if equivalent level of care is available:: No  Covid Evaluation: Asymptomatic - no recent exposure (last 10 days) testing not required  Diagnosis: Acute on chronic diastolic CHF (congestive heart failure) [facility]) [[id number]]  Admitting Physician: Venisha Boehning [[id number]]  Attending Physician: Jannelly Bergren [[id number]]  Certification:: I certify this patient will need inpatient services for at least 2 midnights  Expected Medical Readiness: 02/04/2024            inpatient     I Expect 2 midnight stay secondary to severity of patient's current  illness need for inpatient interventions justified by the following:     Severe lab/radiological/exam abnormalities including:    Anasarca, diastolic CHF    and extensive comorbidities including: Spina bifida  That are currently affecting medical management.   I expect  patient to be hospitalized for 2 midnights requiring inpatient medical care.  Patient is at high risk for adverse outcome (such as loss of life or disability) if not treated.  Indication for inpatient stay as follows: Significant edema preventing patient to achieve ADLs  Need for IV diuretics    Level of care     progressive   Graeson Nouri 02/03/2024, 12:02 AM    [facility]     after 2 AM please page floor coverage   If 7AM-7PM, please contact the day team taking care of the patient using [email or web address]

## 2024-02-02 NOTE — Assessment & Plan Note (Signed)
 Will need to insert foley until able to diurese Given anasarca would be difficult to continue with in/out for now

## 2024-02-02 NOTE — Subjective & Objective (Signed)
 Patient presented back to emergency department after he was discharged yesterday reporting his he has had increased fluid in his legs and scrotum He needs to self cath and he has been having difficulty with that He has been having difficulty with ambulation and bowel program including rectal flushing secondary to increased fluid retention He also have had a bit increase trouble breathing secondary to increased swelling Patient recently admitted for septic shock of unclear origin he has history of spina bifida paraplegic with neurogenic bladder which requires self-catheterization During recent admission he required pressors He was treated with vancomycin  Rocephin  and azithromycin  At baseline he is on oxygen as needed and has BiPAP at nighttime  He was discharged home on Augmentin  and Lasix 

## 2024-02-02 NOTE — Assessment & Plan Note (Signed)
 On bipap at home at home

## 2024-02-02 NOTE — Assessment & Plan Note (Addendum)
 Continue bowel regiment Tap water enema every other day

## 2024-02-02 NOTE — Assessment & Plan Note (Signed)
-   Pt diagnosed with CHF based on presence of the following: increased edema, SOB and abd distention  With noted response to IV diuretic in ER  admit on telemetry,     diurese with IV lasix    40 mg IV Daily and monitor creatinine to avoid over diuresis.  Hold off on echo had recent

## 2024-02-03 ENCOUNTER — Inpatient Hospital Stay (HOSPITAL_COMMUNITY)

## 2024-02-03 DIAGNOSIS — I5033 Acute on chronic diastolic (congestive) heart failure: Secondary | ICD-10-CM | POA: Diagnosis not present

## 2024-02-03 LAB — COMPREHENSIVE METABOLIC PANEL WITH GFR
ALT: 93 U/L — ABNORMAL HIGH (ref 0–44)
AST: 95 U/L — ABNORMAL HIGH (ref 15–41)
Albumin: 3.3 g/dL — ABNORMAL LOW (ref 3.5–5.0)
Alkaline Phosphatase: 55 U/L (ref 38–126)
Anion gap: 12 (ref 5–15)
BUN: 11 mg/dL (ref 6–20)
CO2: 34 mmol/L — ABNORMAL HIGH (ref 22–32)
Calcium: 9.3 mg/dL (ref 8.9–10.3)
Chloride: 92 mmol/L — ABNORMAL LOW (ref 98–111)
Creatinine, Ser: 0.68 mg/dL (ref 0.61–1.24)
GFR, Estimated: >60 mL/min (ref >60)
Glucose, Bld: 111 mg/dL — ABNORMAL HIGH (ref 70–99)
Potassium: 4.1 mmol/L (ref 3.5–5.1)
Sodium: 138 mmol/L (ref 135–145)
Total Bilirubin: 0.4 mg/dL (ref 0.0–1.2)
Total Protein: 6.9 g/dL (ref 6.5–8.1)

## 2024-02-03 LAB — CBC
HCT: 40 % (ref 39.0–52.0)
Hemoglobin: 12.7 g/dL — ABNORMAL LOW (ref 13.0–17.0)
MCH: 29.3 pg (ref 26.0–34.0)
MCHC: 31.8 g/dL (ref 30.0–36.0)
MCV: 92.2 fL (ref 80.0–100.0)
Platelets: 289 K/uL (ref 150–400)
RBC: 4.34 MIL/uL (ref 4.22–5.81)
RDW: 13.2 % (ref 11.5–15.5)
WBC: 7.8 K/uL (ref 4.0–10.5)
nRBC: 0 % (ref 0.0–0.2)

## 2024-02-03 LAB — MAGNESIUM: Magnesium: 2.2 mg/dL (ref 1.7–2.4)

## 2024-02-03 LAB — PHOSPHORUS: Phosphorus: 4.2 mg/dL (ref 2.5–4.6)

## 2024-02-03 MED ORDER — FUROSEMIDE 10 MG/ML IJ SOLN
20.0000 mg | Freq: Two times a day (BID) | INTRAMUSCULAR | Status: DC
  Administered 2024-02-03 – 2024-02-05 (×5): 20 mg via INTRAVENOUS
  Filled 2024-02-03 (×5): qty 2

## 2024-02-03 MED ORDER — CHLORHEXIDINE GLUCONATE CLOTH 2 % EX PADS
6.0000 | MEDICATED_PAD | Freq: Every day | CUTANEOUS | Status: DC
  Administered 2024-02-03 – 2024-02-05 (×4): 6 via TOPICAL

## 2024-02-03 MED ORDER — AMOXICILLIN-POT CLAVULANATE 500-125 MG PO TABS
1.0000 | ORAL_TABLET | Freq: Two times a day (BID) | ORAL | Status: DC
  Administered 2024-02-03 – 2024-02-04 (×2): 1 via ORAL
  Filled 2024-02-03 (×2): qty 1

## 2024-02-03 NOTE — Progress Notes (Signed)
 PROGRESS NOTE    Samuel Becker  FMW:[id number] DOB: 07-27-1990 DOA: 02/02/2024 PCP: Shlomo Wilbert SAUNDERS, MD   33 y.o. M with PMH significant for spina bifida, paraplegia, bicuspid aortic valve, HF with last EF 50-55% and neurogenic bladder and self catheterizes and uses almost daily enemas at home for chronic constipation, was recently admitted to [facility] and discharged on 8/15 for septic shock, workup was largely unremarkable, eventually suspected to have pneumonia, discharged on a regimen of Augmentin  yesterday. - Patient reports that after he went home he was unable to self catheterize as his abdomen was so distended he could not see his genitalia, in addition he had significant scrotal edema, his penis was buried in the scrotal swelling. - Family also had difficulty inserting water enema tube. - Patient reports increased generalized swellings following his recent hospitalization  In the ED afebrile, mildly hypoxic, placed on 2 L O2, no fever or leukocytosis, creatinine 0.6, WBC 8, chest x-ray with mild pulmonary vascular congestion  Subjective: -Complains of abdominal distention  Assessment and Plan:  Acute on chronic diastolic CHF Last echo 7/25 with preserved EF, normal RV, bicuspid aortic valve -Continue low-dose Lasix  20 mg twice daily - Continue losartan , Aldactone , avoid SGLT2i with bladder issues  Chronic constipation, neurogenic bowel - Uses tapwater enema every day at home,  Spina bifida Paraplegia -Chronic  Recent sepsis - Brief hypotension at the time, required pressors for a few hours on admission, treated with broad-spectrum antibiotics, all cultures negative, discharged on Augmentin  to complete 5-day course, decrease dose, DC in 2 days   DVT prophylaxis: Lovenox  Code Status: Full code Family Communication: Mother at bedside Disposition Plan: Home likely few days  Consultants:    Procedures:   Antimicrobials:    Objective: Vitals:   02/03/24 0307 02/03/24  0309 02/03/24 0351 02/03/24 0700  BP:  110/77  112/89  Pulse:  81 95 91  Resp:  20 15 16   Temp:  98.1 F (36.7 C)  97.8 F (36.6 C)  TempSrc:  Oral  Oral  SpO2:  99% 93% 95%  Weight: 51.5 kg     Height: 5' 4 (1.626 m)       Intake/Output Summary (Last 24 hours) at 02/03/2024 1016 Last data filed at 02/03/2024 0309 Gross per 24 hour  Intake --  Output 250 ml  Net -250 ml   Filed Weights   02/02/24 1614 02/03/24 0307  Weight: 54 kg 51.5 kg    Examination:  General exam: Appears calm and comfortable  Respiratory system: Decreased breath sounds to bases Cardiovascular system: S1 & S2 heard, RRR.  Abd: nondistended, soft and nontender.Normal bowel sounds heard.  GU: Significant scrotal edema Central nervous system: Alert and oriented.  Spina bifida with deficits Extremities: 1+ edema Skin: No rashes Psychiatry:  Mood & affect appropriate.     Data Reviewed:   CBC: Recent Labs  Lab 01/27/24 2319 01/29/24 0326 01/30/24 0939 01/31/24 0344 02/01/24 0126 02/02/24 1656  WBC 17.5* 16.0* 11.7* 8.9 7.1 8.2  NEUTROABS 14.9*  --   --   --   --  5.0  HGB 13.6 11.2* 11.6* 11.5* 11.9* 13.2  HCT 39.8 35.2* 35.4* 35.4* 37.3* 42.3  MCV 88.2 93.4 91.0 91.0 92.1 93.4  PLT 207 168 192 202 226 294   Basic Metabolic Panel: Recent Labs  Lab 01/29/24 0326 01/30/24 0939 01/31/24 0344 02/01/24 0126 02/02/24 1656  NA 138 137 138 140 140  K 3.6 3.6 3.3* 3.8 3.9  CL 106 104  100 98 96*  CO2 21* 23 29 28 31   GLUCOSE 107* 102* 101* 93 110*  BUN 7 7 5* 8 10  CREATININE 0.63 0.43* 0.65 0.55* 0.60*  CALCIUM  8.5* 8.6* 8.3* 8.7* 10.0   GFR: Estimated Creatinine Clearance: 94.8 mL/min (A) (by C-G formula based on SCr of 0.6 mg/dL (L)). Liver Function Tests: Recent Labs  Lab 01/27/24 2319 02/02/24 1656  AST 24 58*  ALT 11 56*  ALKPHOS 64 63  BILITOT 0.8 0.4  PROT 7.1 7.2  ALBUMIN 4.3 3.4*   Recent Labs  Lab 02/02/24 1656  LIPASE 38   No results for input(s): AMMONIA  in the last 168 hours. Coagulation Profile: Recent Labs  Lab 01/27/24 2319  INR 1.2   Cardiac Enzymes: No results for input(s): CKTOTAL, CKMB, CKMBINDEX, TROPONINI in the last 168 hours. BNP (last 3 results) Recent Labs    07/23/23 1545  PROBNP 64   HbA1C: No results for input(s): HGBA1C in the last 72 hours. CBG: Recent Labs  Lab 01/29/24 2140  GLUCAP 121*   Lipid Profile: No results for input(s): CHOL, HDL, LDLCALC, TRIG, CHOLHDL, LDLDIRECT in the last 72 hours. Thyroid Function Tests: No results for input(s): TSH, T4TOTAL, FREET4, T3FREE, THYROIDAB in the last 72 hours. Anemia Panel: No results for input(s): VITAMINB12, FOLATE, FERRITIN, TIBC, IRON, RETICCTPCT in the last 72 hours. Urine analysis:    Component Value Date/Time   COLORURINE YELLOW 02/02/2024 1656   APPEARANCEUR CLOUDY (A) 02/02/2024 1656   LABSPEC 1.012 02/02/2024 1656   PHURINE 7.0 02/02/2024 1656   GLUCOSEU NEGATIVE 02/02/2024 1656   HGBUR NEGATIVE 02/02/2024 1656   BILIRUBINUR NEGATIVE 02/02/2024 1656   KETONESUR NEGATIVE 02/02/2024 1656   PROTEINUR NEGATIVE 02/02/2024 1656   UROBILINOGEN 0.2 02/14/2009 1924   NITRITE NEGATIVE 02/02/2024 1656   LEUKOCYTESUR NEGATIVE 02/02/2024 1656   Sepsis Labs: @LABRCNTIP (procalcitonin:4,lacticidven:4)  ) Recent Results (from the past 240 hours)  Culture, blood (Routine x 2)     Status: None   Collection Time: 01/27/24 11:18 PM   Specimen: BLOOD  Result Value Ref Range Status   Specimen Description   Final    BLOOD BLOOD LEFT HAND Performed at [facility], [address] [place], [place] [address]    Special Requests   Final    BOTTLES DRAWN AEROBIC AND ANAEROBIC Blood Culture adequate volume Performed at [facility], [address] [place], [place] [address]    Culture   Final    NO GROWTH 5 DAYS Performed at [facility], [address]  [place], [place] [address]    Report Status 02/02/2024 FINAL  Final  Resp panel by RT-PCR (RSV, Flu A&B, Covid) Anterior Nasal Swab     Status: None   Collection Time: 01/27/24 11:18 PM   Specimen: Anterior Nasal Swab  Result Value Ref Range Status   SARS Coronavirus 2 by RT PCR NEGATIVE NEGATIVE Final    Comment: (NOTE) SARS-CoV-2 target nucleic acids are NOT DETECTED.  The SARS-CoV-2 RNA is generally detectable in upper respiratory specimens during the acute phase of infection. The lowest concentration of SARS-CoV-2 viral copies this assay can detect is 138 copies/mL. A negative result does not preclude SARS-Cov-2 infection and should not be used as the sole basis for treatment or other patient management decisions. A negative result may occur with  improper specimen collection/handling, submission of specimen other than nasopharyngeal swab, presence of viral mutation(s) within the areas targeted by this assay, and inadequate number of  viral copies(<138 copies/mL). A negative result must be combined with clinical observations, patient history, and epidemiological information. The expected result is Negative.  Fact Sheet for Patients:  BloggerCourse.com  Fact Sheet for Healthcare Providers:  SeriousBroker.it  This test is no t yet approved or cleared by the [place]  FDA and  has been authorized for detection and/or diagnosis of SARS-CoV-2 by FDA under an Emergency Use Authorization (EUA). This EUA will remain  in effect (meaning this test can be used) for the duration of the COVID-19 declaration under Section 564(b)(1) of the Act, 21 U.S.C.section 360bbb-3(b)(1), unless the authorization is terminated  or revoked sooner.       Influenza A by PCR NEGATIVE NEGATIVE Final   Influenza B by PCR NEGATIVE NEGATIVE Final    Comment: (NOTE) The Xpert Xpress SARS-CoV-2/FLU/RSV plus assay is intended as an aid in the diagnosis of  influenza from Nasopharyngeal swab specimens and should not be used as a sole basis for treatment. Nasal washings and aspirates are unacceptable for Xpert Xpress SARS-CoV-2/FLU/RSV testing.  Fact Sheet for Patients: BloggerCourse.com  Fact Sheet for Healthcare Providers: SeriousBroker.it  This test is not yet approved or cleared by the [place]  FDA and has been authorized for detection and/or diagnosis of SARS-CoV-2 by FDA under an Emergency Use Authorization (EUA). This EUA will remain in effect (meaning this test can be used) for the duration of the COVID-19 declaration under Section 564(b)(1) of the Act, 21 U.S.C. section 360bbb-3(b)(1), unless the authorization is terminated or revoked.     Resp Syncytial Virus by PCR NEGATIVE NEGATIVE Final    Comment: (NOTE) Fact Sheet for Patients: BloggerCourse.com  Fact Sheet for Healthcare Providers: SeriousBroker.it  This test is not yet approved or cleared by the [place]  FDA and has been authorized for detection and/or diagnosis of SARS-CoV-2 by FDA under an Emergency Use Authorization (EUA). This EUA will remain in effect (meaning this test can be used) for the duration of the COVID-19 declaration under Section 564(b)(1) of the Act, 21 U.S.C. section 360bbb-3(b)(1), unless the authorization is terminated or revoked.  Performed at [facility], [address] [place], [place] [address]   Culture, blood (Routine x 2)     Status: None   Collection Time: 01/28/24  2:18 AM   Specimen: BLOOD  Result Value Ref Range Status   Specimen Description   Final    BLOOD BLOOD RIGHT FOREARM Performed at [facility], [address] [place], [place] [address]    Special Requests   Final    BOTTLES DRAWN AEROBIC AND ANAEROBIC Blood Culture adequate volume Performed at [facility], [address] [place], [place] [address]    Culture   Final    NO GROWTH 5 DAYS Performed at [facility], [address], [place], [place] [address]    Report Status 02/02/2024 FINAL  Final  MRSA Next Gen by PCR, Nasal     Status: None   Collection Time: 01/28/24  6:13 AM   Specimen: Nasal Mucosa; Nasal Swab  Result Value Ref Range Status   MRSA by PCR Next Gen NOT DETECTED NOT DETECTED Final    Comment: (NOTE) The GeneXpert MRSA Assay (FDA approved for NASAL specimens only), is one component of a comprehensive MRSA colonization surveillance program. It is not intended to diagnose MRSA infection nor to guide or monitor treatment for MRSA infections. Test performance is not FDA approved in patients less than 39 years old. Performed at [facility], [address] [place], [place] [address]   Urine Culture (for pregnant, neutropenic or urologic patients or patients with an indwelling urinary catheter)     Status: None   Collection Time: 01/28/24  8:36 AM   Specimen: Urine, Catheterized  Result Value Ref Range Status   Specimen Description URINE, CATHETERIZED  Final   Special Requests NONE  Final   Culture   Final    NO GROWTH Performed at [facility], [address], [place], [place] [address]    Report Status 01/29/2024 FINAL  Final  Respiratory (~20 pathogens) panel by PCR     Status: None   Collection Time: 01/29/24 11:49 AM   Specimen: Nasopharyngeal Swab; Respiratory  Result Value Ref Range Status   Adenovirus NOT DETECTED NOT DETECTED Final   Coronavirus 229E NOT DETECTED NOT DETECTED Final    Comment: (NOTE) The Coronavirus on the Respiratory Panel, DOES NOT test for the novel  Coronavirus (2019 nCoV)    Coronavirus HKU1 NOT DETECTED NOT DETECTED Final   Coronavirus NL63 NOT DETECTED NOT DETECTED Final   Coronavirus OC43 NOT DETECTED NOT DETECTED Final   Metapneumovirus NOT DETECTED NOT DETECTED Final   Rhinovirus / Enterovirus  NOT DETECTED NOT DETECTED Final   Influenza A NOT DETECTED NOT DETECTED Final   Influenza B NOT DETECTED NOT DETECTED Final   Parainfluenza Virus 1 NOT DETECTED NOT DETECTED Final   Parainfluenza Virus 2 NOT DETECTED NOT DETECTED Final   Parainfluenza Virus 3 NOT DETECTED NOT DETECTED Final   Parainfluenza Virus 4 NOT DETECTED NOT DETECTED Final   Respiratory Syncytial Virus NOT DETECTED NOT DETECTED Final   Bordetella pertussis NOT DETECTED NOT DETECTED Final   Bordetella Parapertussis NOT DETECTED NOT DETECTED Final   Chlamydophila pneumoniae NOT DETECTED NOT DETECTED Final   Mycoplasma pneumoniae NOT DETECTED NOT DETECTED Final    Comment: Performed at [facility], [address], [place], [place] [address]     Radiology Studies: DG Abd 1 View Result Date: 02/03/2024 CLINICAL DATA:  Abdominal distension EXAM: ABDOMEN - 1 VIEW COMPARISON:  CT [id number] FINDINGS: VP shunt catheter tip in the right pelvis. Extensive spinal fusion hardware. Left convex scoliosis. Nonobstructive bowel-gas pattern. No radiopaque calculi. Chronic subluxation right femoral head IMPRESSION: Nonobstructive bowel-gas pattern. Electronically Signed   By: Norman Gatlin M.D.   On: 02/03/2024 00:28   DG Chest Portable 1 View Result Date: 02/02/2024 CLINICAL DATA:  Short of breath.  Groin swelling. EXAM: PORTABLE CHEST 1 VIEW COMPARISON:  Radiograph 01/28/2024 FINDINGS: Posterior thoracolumbar fusion. Severe scoliosis noted. VP shunt catheter along the RIGHT chest wall. Low lung volumes. Mild venous congestion. No pneumothorax. No focal consolidation. IMPRESSION: Low lung volumes and mild venous congestion. Electronically Signed   By: Jackquline Boxer M.D.   On: 02/02/2024 17:10     Scheduled Meds:  amoxicillin -clavulanate  1 tablet Oral BID   Chlorhexidine  Gluconate Cloth  6 each Topical Daily   furosemide   20 mg Intravenous BID   guaiFENesin   600 mg Oral BID   loratadine   10 mg Oral Daily   losartan   12.5  mg Oral Daily   metoprolol  succinate  50 mg Oral Daily   oxybutynin   15 mg Oral QHS   potassium chloride  SA  20 mEq Oral Daily   sodium chloride  flush  3 mL Intravenous Q12H   spironolactone   12.5 mg Oral Daily   Continuous Infusions:  sodium chloride   LOS: 1 day    Time spent:    Sigurd Pac, MD [facility]   02/03/2024, 10:16 AM

## 2024-02-03 NOTE — ED Notes (Signed)
 Emptied 1000 ml of clear yellow urine from foley catheter bag.

## 2024-02-03 NOTE — Plan of Care (Signed)

## 2024-02-03 NOTE — Plan of Care (Signed)
   Problem: Health Behavior/Discharge Planning: Goal: Ability to manage health-related needs will improve Outcome: Progressing

## 2024-02-03 NOTE — Progress Notes (Signed)
   02/03/24 0351  BiPAP/CPAP/SIPAP  $ Non-Invasive Home Ventilator  Initial  BiPAP/CPAP/SIPAP Pt Type Adult  BiPAP/CPAP/SIPAP  (VOCSN machine)  Mask Type Full face mask  Dentures removed? Not applicable  Set Rate 12 breaths/min  Respiratory Rate 14 breaths/min  EPAP 6 cmH2O  PEEP 6 cmH20  FiO2 (%) 28 %  Flow Rate 3 lpm  Minute Ventilation 6.5  Leak 52  Tidal Volume (Vt) 339  Patient Home Machine Yes  Safety Check Completed by RT for Home Unit Yes, no issues noted  Patient Home Mask Yes  Patient Home Tubing Yes  Auto Titrate No  Press High Alarm 16 cmH2O  CPAP/SIPAP surface wiped down Yes  Device Plugged into RED Power Outlet Yes  Oxygen Percent 28 %  BiPAP/CPAP /SiPAP Vitals  Pulse Rate 95  Resp 15  SpO2 93 %  MEWS Score/Color  MEWS Score 0  MEWS Score Color Landy

## 2024-02-03 NOTE — Progress Notes (Signed)
   02/03/24 2320  BiPAP/CPAP/SIPAP  $ Non-Invasive Home Ventilator  Subsequent  BiPAP/CPAP/SIPAP Pt Type Adult  BiPAP/CPAP/SIPAP  (Home unit)  Mask Type Full face mask  Dentures removed? Not applicable  Mask Size Medium  Respiratory Rate 19 breaths/min  FiO2 (%) 28 %  Flow Rate 2 lpm  Tidal Volume (Vt) 435  Patient Home Machine Yes  Safety Check Completed by RT for Home Unit Yes, no issues noted  Patient Home Mask Yes  Patient Home Tubing Yes  Auto Titrate No  Device Plugged into RED Power Outlet Yes  Oxygen Percent 28 %

## 2024-02-03 NOTE — ED Notes (Signed)
Pt cleaned of large BM

## 2024-02-04 DIAGNOSIS — I5033 Acute on chronic diastolic (congestive) heart failure: Secondary | ICD-10-CM | POA: Diagnosis not present

## 2024-02-04 MED ORDER — GERHARDT'S BUTT CREAM
TOPICAL_CREAM | CUTANEOUS | Status: DC | PRN
  Filled 2024-02-04: qty 60

## 2024-02-04 MED ORDER — AMOXICILLIN-POT CLAVULANATE 500-125 MG PO TABS
1.0000 | ORAL_TABLET | Freq: Two times a day (BID) | ORAL | Status: AC
  Administered 2024-02-04: 1 via ORAL
  Filled 2024-02-04: qty 1

## 2024-02-04 NOTE — Plan of Care (Signed)
   Problem: Education: Goal: Knowledge of General Education information will improve Description Including pain rating scale, medication(s)/side effects and non-pharmacologic comfort measures Outcome: Progressing   Problem: Health Behavior/Discharge Planning: Goal: Ability to manage health-related needs will improve Outcome: Progressing

## 2024-02-04 NOTE — Progress Notes (Signed)
 PROGRESS NOTE    Samuel Becker  FMW:[id number] DOB: 1990-08-13 DOA: 02/02/2024 PCP: Shlomo Wilbert SAUNDERS, MD   33 y.o. M with PMH significant for spina bifida, paraplegia, bicuspid aortic valve, HF with last EF 50-55% and neurogenic bladder and self catheterizes and uses almost daily enemas at home for chronic constipation, was recently admitted to [facility] and discharged on 8/15 for septic shock, workup was largely unremarkable, eventually suspected to have pneumonia, discharged on a regimen of Augmentin  yesterday. - Patient reports that after he went home he was unable to self catheterize as his abdomen was so distended he could not see his genitalia, in addition he had significant scrotal edema, his penis was buried in the scrotal swelling. - Family also had difficulty inserting water enema tube. - Patient reports increased generalized swellings following his recent hospitalization  In the ED afebrile, mildly hypoxic, placed on 2 L O2, no fever or leukocytosis, creatinine 0.6, WBC 8, chest x-ray with mild pulmonary vascular congestion  Subjective: - Feels better, still with some abdominal distention but overall improving, swelling continues to improve as well  Assessment and Plan:  Acute on chronic diastolic CHF Last echo 7/25 with preserved EF, normal RV, bicuspid aortic valve - Improving with diuresis, 2 L negative  - BP soft but relatively stable, continue low-dose IV Lasix  twice daily today- - Continue losartan , Aldactone , avoid SGLT2i with bladder issues  Chronic constipation, neurogenic bowel - Uses tapwater enema every day at home, - Repeat today  Spina bifida Paraplegia -Chronic  Recent sepsis - Brief hypotension at the time, required pressors for a few hours on admission, treated with broad-spectrum antibiotics, all cultures negative, discharged on Augmentin  to complete 5-day course, decrease dose, will DC ABX today   DVT prophylaxis: Lovenox  Code Status: Full code Family  Communication: Mother at bedside Disposition Plan: Home tomorrow  Consultants:    Procedures:   Antimicrobials:    Objective: Vitals:   02/04/24 0000 02/04/24 0353 02/04/24 0500 02/04/24 0856  BP: (!) 110/93 113/87  102/79  Pulse: 77 94    Resp: 14 16    Temp:  97.6 F (36.4 C)  97.9 F (36.6 C)  TempSrc:  Oral  Oral  SpO2: 96% 96%    Weight:   50.6 kg   Height:        Intake/Output Summary (Last 24 hours) at 02/04/2024 0947 Last data filed at 02/04/2024 0925 Gross per 24 hour  Intake 300 ml  Output 2225 ml  Net -1925 ml   Filed Weights   02/02/24 1614 02/03/24 0307 02/04/24 0500  Weight: 54 kg 51.5 kg 50.6 kg    Examination:  General exam: Appears calm and comfortable  Respiratory system: Decreased breath sounds to bases Cardiovascular system: S1 & S2 heard, RRR.  Abd: Distended, soft, nontender, bowel sounds present.  GU: Improving scrotal edema Central nervous system: Alert and oriented.  Spina bifida with deficits Extremities: Trace edema Skin: No rashes Psychiatry:  Mood & affect appropriate.     Data Reviewed:   CBC: Recent Labs  Lab 01/30/24 0939 01/31/24 0344 02/01/24 0126 02/02/24 1656 02/03/24 1134  WBC 11.7* 8.9 7.1 8.2 7.8  NEUTROABS  --   --   --  5.0  --   HGB 11.6* 11.5* 11.9* 13.2 12.7*  HCT 35.4* 35.4* 37.3* 42.3 40.0  MCV 91.0 91.0 92.1 93.4 92.2  PLT 192 202 226 294 289   Basic Metabolic Panel: Recent Labs  Lab 01/30/24 0939 01/31/24 0344 02/01/24 0126  02/02/24 1656 02/03/24 1134  NA 137 138 140 140 138  K 3.6 3.3* 3.8 3.9 4.1  CL 104 100 98 96* 92*  CO2 23 29 28 31  34*  GLUCOSE 102* 101* 93 110* 111*  BUN 7 5* 8 10 11   CREATININE 0.43* 0.65 0.55* 0.60* 0.68  CALCIUM  8.6* 8.3* 8.7* 10.0 9.3  MG  --   --   --   --  2.2  PHOS  --   --   --   --  4.2   GFR: Estimated Creatinine Clearance: 93.1 mL/min (by C-G formula based on SCr of 0.68 mg/dL). Liver Function Tests: Recent Labs  Lab 02/02/24 1656 02/03/24 1134   AST 58* 95*  ALT 56* 93*  ALKPHOS 63 55  BILITOT 0.4 0.4  PROT 7.2 6.9  ALBUMIN 3.4* 3.3*   Recent Labs  Lab 02/02/24 1656  LIPASE 38   No results for input(s): AMMONIA in the last 168 hours. Coagulation Profile: No results for input(s): INR, PROTIME in the last 168 hours.  Cardiac Enzymes: No results for input(s): CKTOTAL, CKMB, CKMBINDEX, TROPONINI in the last 168 hours. BNP (last 3 results) Recent Labs    07/23/23 1545  PROBNP 64   HbA1C: No results for input(s): HGBA1C in the last 72 hours. CBG: Recent Labs  Lab 01/29/24 2140  GLUCAP 121*   Lipid Profile: No results for input(s): CHOL, HDL, LDLCALC, TRIG, CHOLHDL, LDLDIRECT in the last 72 hours. Thyroid Function Tests: No results for input(s): TSH, T4TOTAL, FREET4, T3FREE, THYROIDAB in the last 72 hours. Anemia Panel: No results for input(s): VITAMINB12, FOLATE, FERRITIN, TIBC, IRON, RETICCTPCT in the last 72 hours. Urine analysis:    Component Value Date/Time   COLORURINE YELLOW 02/02/2024 1656   APPEARANCEUR CLOUDY (A) 02/02/2024 1656   LABSPEC 1.012 02/02/2024 1656   PHURINE 7.0 02/02/2024 1656   GLUCOSEU NEGATIVE 02/02/2024 1656   HGBUR NEGATIVE 02/02/2024 1656   BILIRUBINUR NEGATIVE 02/02/2024 1656   KETONESUR NEGATIVE 02/02/2024 1656   PROTEINUR NEGATIVE 02/02/2024 1656   UROBILINOGEN 0.2 02/14/2009 1924   NITRITE NEGATIVE 02/02/2024 1656   LEUKOCYTESUR NEGATIVE 02/02/2024 1656   Sepsis Labs: @LABRCNTIP (procalcitonin:4,lacticidven:4)  ) Recent Results (from the past 240 hours)  Culture, blood (Routine x 2)     Status: None   Collection Time: 01/27/24 11:18 PM   Specimen: BLOOD  Result Value Ref Range Status   Specimen Description   Final    BLOOD BLOOD LEFT HAND Performed at [facility], [address] [place], [place] [address]    Special Requests   Final    BOTTLES DRAWN AEROBIC AND ANAEROBIC Blood Culture adequate  volume Performed at [facility], [address] [place], [place] [address]    Culture   Final    NO GROWTH 5 DAYS Performed at [facility], [address] [place], [place] [address]    Report Status 02/02/2024 FINAL  Final  Resp panel by RT-PCR (RSV, Flu A&B, Covid) Anterior Nasal Swab     Status: None   Collection Time: 01/27/24 11:18 PM   Specimen: Anterior Nasal Swab  Result Value Ref Range Status   SARS Coronavirus 2 by RT PCR NEGATIVE NEGATIVE Final    Comment: (NOTE) SARS-CoV-2 target nucleic acids are NOT DETECTED.  The SARS-CoV-2 RNA is generally detectable in upper respiratory specimens during the acute phase of infection. The lowest concentration of SARS-CoV-2 viral copies this assay can detect is 138 copies/mL. A negative result does not  preclude SARS-Cov-2 infection and should not be used as the sole basis for treatment or other patient management decisions. A negative result may occur with  improper specimen collection/handling, submission of specimen other than nasopharyngeal swab, presence of viral mutation(s) within the areas targeted by this assay, and inadequate number of viral copies(<138 copies/mL). A negative result must be combined with clinical observations, patient history, and epidemiological information. The expected result is Negative.  Fact Sheet for Patients:  BloggerCourse.com  Fact Sheet for Healthcare Providers:  SeriousBroker.it  This test is no t yet approved or cleared by the [place]  FDA and  has been authorized for detection and/or diagnosis of SARS-CoV-2 by FDA under an Emergency Use Authorization (EUA). This EUA will remain  in effect (meaning this test can be used) for the duration of the COVID-19 declaration under Section 564(b)(1) of the Act, 21 U.S.C.section 360bbb-3(b)(1), unless the authorization is terminated  or revoked sooner.       Influenza  A by PCR NEGATIVE NEGATIVE Final   Influenza B by PCR NEGATIVE NEGATIVE Final    Comment: (NOTE) The Xpert Xpress SARS-CoV-2/FLU/RSV plus assay is intended as an aid in the diagnosis of influenza from Nasopharyngeal swab specimens and should not be used as a sole basis for treatment. Nasal washings and aspirates are unacceptable for Xpert Xpress SARS-CoV-2/FLU/RSV testing.  Fact Sheet for Patients: BloggerCourse.com  Fact Sheet for Healthcare Providers: SeriousBroker.it  This test is not yet approved or cleared by the [place]  FDA and has been authorized for detection and/or diagnosis of SARS-CoV-2 by FDA under an Emergency Use Authorization (EUA). This EUA will remain in effect (meaning this test can be used) for the duration of the COVID-19 declaration under Section 564(b)(1) of the Act, 21 U.S.C. section 360bbb-3(b)(1), unless the authorization is terminated or revoked.     Resp Syncytial Virus by PCR NEGATIVE NEGATIVE Final    Comment: (NOTE) Fact Sheet for Patients: BloggerCourse.com  Fact Sheet for Healthcare Providers: SeriousBroker.it  This test is not yet approved or cleared by the [place]  FDA and has been authorized for detection and/or diagnosis of SARS-CoV-2 by FDA under an Emergency Use Authorization (EUA). This EUA will remain in effect (meaning this test can be used) for the duration of the COVID-19 declaration under Section 564(b)(1) of the Act, 21 U.S.C. section 360bbb-3(b)(1), unless the authorization is terminated or revoked.  Performed at [facility], [address], [place], [place] [address]   Culture, blood (Routine x 2)     Status: None   Collection Time: 01/28/24  2:18 AM   Specimen: BLOOD  Result Value Ref Range Status   Specimen Description   Final    BLOOD BLOOD RIGHT FOREARM Performed at [facility], [address] [place], [place] [address]    Special Requests   Final    BOTTLES DRAWN AEROBIC AND ANAEROBIC Blood Culture adequate volume Performed at [facility], [address] [place], [place] [address]    Culture   Final    NO GROWTH 5 DAYS Performed at [facility], [address], [place], [place] [address]    Report Status 02/02/2024 FINAL  Final  MRSA Next Gen by PCR, Nasal     Status: None   Collection Time: 01/28/24  6:13 AM   Specimen: Nasal Mucosa; Nasal Swab  Result Value Ref Range Status   MRSA by PCR Next Gen NOT DETECTED NOT DETECTED Final    Comment: (NOTE) The GeneXpert  MRSA Assay (FDA approved for NASAL specimens only), is one component of a comprehensive MRSA colonization surveillance program. It is not intended to diagnose MRSA infection nor to guide or monitor treatment for MRSA infections. Test performance is not FDA approved in patients less than 72 years old. Performed at [facility], [address] [place], [place] [address]   Urine Culture (for pregnant, neutropenic or urologic patients or patients with an indwelling urinary catheter)     Status: None   Collection Time: 01/28/24  8:36 AM   Specimen: Urine, Catheterized  Result Value Ref Range Status   Specimen Description URINE, CATHETERIZED  Final   Special Requests NONE  Final   Culture   Final    NO GROWTH Performed at [facility], [address], [place], [place] [address]    Report Status 01/29/2024 FINAL  Final  Respiratory (~20 pathogens) panel by PCR     Status: None   Collection Time: 01/29/24 11:49 AM   Specimen: Nasopharyngeal Swab; Respiratory  Result Value Ref Range Status   Adenovirus NOT DETECTED NOT DETECTED Final   Coronavirus 229E NOT DETECTED NOT DETECTED Final    Comment: (NOTE) The Coronavirus on the Respiratory Panel, DOES NOT test for the novel  Coronavirus (2019 nCoV)    Coronavirus HKU1 NOT DETECTED NOT  DETECTED Final   Coronavirus NL63 NOT DETECTED NOT DETECTED Final   Coronavirus OC43 NOT DETECTED NOT DETECTED Final   Metapneumovirus NOT DETECTED NOT DETECTED Final   Rhinovirus / Enterovirus NOT DETECTED NOT DETECTED Final   Influenza A NOT DETECTED NOT DETECTED Final   Influenza B NOT DETECTED NOT DETECTED Final   Parainfluenza Virus 1 NOT DETECTED NOT DETECTED Final   Parainfluenza Virus 2 NOT DETECTED NOT DETECTED Final   Parainfluenza Virus 3 NOT DETECTED NOT DETECTED Final   Parainfluenza Virus 4 NOT DETECTED NOT DETECTED Final   Respiratory Syncytial Virus NOT DETECTED NOT DETECTED Final   Bordetella pertussis NOT DETECTED NOT DETECTED Final   Bordetella Parapertussis NOT DETECTED NOT DETECTED Final   Chlamydophila pneumoniae NOT DETECTED NOT DETECTED Final   Mycoplasma pneumoniae NOT DETECTED NOT DETECTED Final    Comment: Performed at [facility], [address] [place], [place] [address]     Radiology Studies: DG Abd 1 View Result Date: 02/03/2024 CLINICAL DATA:  Abdominal distension EXAM: ABDOMEN - 1 VIEW COMPARISON:  CT [id number] FINDINGS: VP shunt catheter tip in the right pelvis. Extensive spinal fusion hardware. Left convex scoliosis. Nonobstructive bowel-gas pattern. No radiopaque calculi. Chronic subluxation right femoral head IMPRESSION: Nonobstructive bowel-gas pattern. Electronically Signed   By: Norman Gatlin M.D.   On: 02/03/2024 00:28   DG Chest Portable 1 View Result Date: 02/02/2024 CLINICAL DATA:  Short of breath.  Groin swelling. EXAM: PORTABLE CHEST 1 VIEW COMPARISON:  Radiograph 01/28/2024 FINDINGS: Posterior thoracolumbar fusion. Severe scoliosis noted. VP shunt catheter along the RIGHT chest wall. Low lung volumes. Mild venous congestion. No pneumothorax. No focal consolidation. IMPRESSION: Low lung volumes and mild venous congestion. Electronically Signed   By: Jackquline Boxer M.D.   On: 02/02/2024 17:10     Scheduled Meds:   amoxicillin -clavulanate  1 tablet Oral BID   Chlorhexidine  Gluconate Cloth  6 each Topical Daily   furosemide   20 mg Intravenous BID   guaiFENesin   600 mg Oral BID   loratadine   10 mg Oral Daily   losartan   12.5 mg Oral Daily   metoprolol  succinate  50 mg  Oral Daily   oxybutynin   15 mg Oral QHS   potassium chloride  SA  20 mEq Oral Daily   sodium chloride  flush  3 mL Intravenous Q12H   spironolactone   12.5 mg Oral Daily   Continuous Infusions:     LOS: 2 days    Time spent:    Sigurd Pac, MD [facility]   02/04/2024, 9:47 AM

## 2024-02-04 NOTE — Progress Notes (Signed)
 Placed patient on home AVAPS machine with oxygen set at 2lpm

## 2024-02-04 NOTE — TOC CM/SW Note (Signed)
 Transition of Care [facility]) - Inpatient Brief Assessment   Patient Details  Name: Samuel Becker MRN: [id number] Date of Birth: March 16, 1991  Transition of Care [facility]) CM/SW Contact:    Lauraine FORBES Saa, LCSWA Phone Number: 02/04/2024, 10:13 AM   Clinical Narrative:  10:13 AM Per chart review, patient resides at home with parent(s). Patient has a PCP and insurance. Patient does not have SNF or HH history. Patient has DME (oxygen, nebulizer machine, wheelchair, stander) history with [employer] and [employer]. Patient's preferred pharmacy is [facility] [phone] [facility]. TOC consult was placed for [facility] screen and SNF placement. TOC will continue to follow and be available to assist.  Transition of Care Asessment: Insurance and Status: Insurance coverage has been reviewed Patient has primary care physician: Yes Home environment has been reviewed: Private Residence Prior level of function:: N/A Prior/Current Home Services: No current home services Social Drivers of Health Review: SDOH reviewed no interventions necessary Readmission risk has been reviewed: Yes (Currently Yellow 15%) Transition of care needs: transition of care needs identified, TOC will continue to follow

## 2024-02-05 DIAGNOSIS — I5033 Acute on chronic diastolic (congestive) heart failure: Secondary | ICD-10-CM | POA: Diagnosis not present

## 2024-02-05 LAB — BASIC METABOLIC PANEL WITH GFR
Anion gap: 12 (ref 5–15)
BUN: 18 mg/dL (ref 6–20)
CO2: 31 mmol/L (ref 22–32)
Calcium: 9.3 mg/dL (ref 8.9–10.3)
Chloride: 94 mmol/L — ABNORMAL LOW (ref 98–111)
Creatinine, Ser: 0.68 mg/dL (ref 0.61–1.24)
GFR, Estimated: >60 mL/min (ref >60)
Glucose, Bld: 97 mg/dL (ref 70–99)
Potassium: 4 mmol/L (ref 3.5–5.1)
Sodium: 137 mmol/L (ref 135–145)

## 2024-02-05 NOTE — TOC Initial Note (Signed)
 Transition of Care [facility]) - Initial/Assessment Note    Patient Details  Name: Samuel Becker MRN: [id number] Date of Birth: May 04, 1991  Transition of Care [facility]) CM/SW Contact:    Stephane Powell Jansky, RN Phone Number: 02/05/2024, 1:35 PM  Clinical Narrative:                  Spoke to patient , step mother and father at bedside. PAtient from home with family. Patient has wheelchair and home oxygen with [employer]. Portable oxygen DME at bedside.   Patient has a home health aide already.   MD ordered [facility]. Provided medicare.gov list of home health agencies. Patient and family want [facility] . Shylise with [facility] accepted referral. AVS updated  Expected Discharge Plan: Home w Home Health Services Barriers to Discharge: No Barriers Identified   Patient Goals and CMS Choice Patient states their goals for this hospitalization and ongoing recovery are:: to return to home CMS Medicare.gov Compare Post Acute Care list provided to:: Patient Choice offered to / list presented to : Patient, Parent      Expected Discharge Plan and Services   Discharge Planning Services: CM Consult Post Acute Care Choice: Home Health Living arrangements for the past 2 months: Single Family Home Expected Discharge Date: 02/05/24               DME Arranged: N/A DME Agency: NA       HH Arranged: RN HH Agency: [facility] Date HH Agency Contacted: 02/05/24 Time HH Agency Contacted: 1334 Representative spoke with at [facility] Agency: Shylise  Prior Living Arrangements/Services Living arrangements for the past 2 months: Single Family Home Lives with:: Parents Patient language and need for interpreter reviewed:: Yes Do you feel safe going back to the place where you live?: Yes      Need for Family Participation in Patient Care: Yes (Comment) Care giver support system in place?: Yes (comment) Current home services: Homehealth aide, DME Criminal Activity/Legal Involvement Pertinent to  Current Situation/Hospitalization: No - Comment as needed  Activities of Daily Living   ADL Screening (condition at time of admission) Independently performs ADLs?: No Does the patient have a NEW difficulty with bathing/dressing/toileting/self-feeding that is expected to last >3 days?: No Does the patient have a NEW difficulty with getting in/out of bed, walking, or climbing stairs that is expected to last >3 days?: No Does the patient have a NEW difficulty with communication that is expected to last >3 days?: No Is the patient deaf or have difficulty hearing?: No Does the patient have difficulty seeing, even when wearing glasses/contacts?: No Does the patient have difficulty concentrating, remembering, or making decisions?: No  Permission Sought/Granted   Permission granted to share information with : Yes, Verbal Permission Granted  Share Information with NAME: Montie Dane and Dorise Dane  Permission granted to share info w AGENCY: [facility]  Permission granted to share info w Relationship: father , step mother     Emotional Assessment Appearance:: Appears stated age Attitude/Demeanor/Rapport: Engaged Affect (typically observed): Pleasant Orientation: : Oriented to Self, Oriented to Place, Oriented to  Time, Oriented to Situation Alcohol  / Substance Use: Not Applicable Psych Involvement: No (comment)  Admission diagnosis:  Anasarca [R60.1] Acute on chronic diastolic CHF (congestive heart failure) (HCC) [I50.33] Patient Active Problem List   Diagnosis Date Noted   Acute on chronic diastolic CHF (congestive heart failure) (HCC) 02/02/2024   Septic shock (HCC) 01/28/2024   Esotropia 03/15/2023   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   Tethered cord (HCC) 03/15/2023   Wheelchair dependence 03/15/2023   Hoarseness 01/10/2023   Hypokalemia 10/05/2022   Bicuspid aortic valve 10/04/2022   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   Pressure  injury of skin 10/03/2022   Dilated cardiomyopathy (HCC) 10/03/2022   Heart failure with improved ejection fraction (HFimpEF) (HCC) 10/02/2022   Elevated troponin 10/02/2022   Spina bifida (HCC) 10/02/2022   Paraplegia (HCC) 10/02/2022   Acute hypoxemic respiratory failure (HCC) 09/20/2022   Kidney stone on left side 02/18/2019   History of strabismus surgery 01/06/2015   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   Cavus foot, acquired 05/10/2012   Hydrocephalus (HCC) 04/25/2012   Allergic rhinitis 10/25/2009   Idiopathic scoliosis and kyphoscoliosis 10/25/2009   Neurogenic bladder 10/25/2009   Neurogenic bowel 10/25/2009   Obstructive hydrocephalus (HCC) 10/25/2009   Spina bifida of thoracic region with hydrocephalus (HCC) 10/25/2009   PCP:  Cleotilde Planas, MD Pharmacy:   [facility] [phone] [place], [place] - [address] AT [facility] [address] & [facility] [address] [place] [address] Phone: [phone] Fax: [phone]     Social Drivers of Health (SDOH) Social History: SDOH Screenings   Food Insecurity: No Food Insecurity (02/03/2024)  Housing: Low Risk  (02/03/2024)  Transportation Needs: No Transportation Needs (02/03/2024)  Utilities: Not At Risk (02/03/2024)  Alcohol  Screen: Low Risk  (10/06/2022)  Financial Resource Strain: Low Risk  (10/06/2022)  Physical Activity: Insufficiently Active (09/01/2021)   Received from [facility]  Social Connections: Unknown (09/21/2022)   Received from [facility]  Stress: No Stress Concern Present (09/01/2021)   Received from [facility]  Tobacco Use: Low Risk  (02/02/2024)   SDOH Interventions:     Readmission Risk Interventions    10/03/2022    6:02 PM 09/26/2022    9:25 AM 09/21/2022    3:22 PM  Readmission Risk Prevention Plan  Post Dischage Appt Complete Complete Complete  Medication Screening Complete Complete Complete  Transportation Screening Complete Complete Complete

## 2024-02-05 NOTE — Discharge Summary (Addendum)
 Physician Discharge Summary  Samuel Becker FMW:[id number] DOB: [birth year] DOA: 02/02/2024  PCP: Cleotilde Planas, MD  Admit date: 02/02/2024 Discharge date: 02/05/2024  Time spent: 45 minutes  Recommendations for Outpatient Follow-up:  PCP in 1 week [facility] Glendia Ferrier on 9/5   Discharge Diagnoses:  Principal Problem:   Acute on chronic diastolic CHF (congestive heart failure) (HCC) Active Problems:   Acute hypoxemic respiratory failure (HCC)   Paraplegia (HCC)   Idiopathic scoliosis and kyphoscoliosis   Neurogenic bladder   Neurogenic bowel   Spina bifida of thoracic region with hydrocephalus [facility])   Discharge Condition: Improved  Diet recommendation: Low-sodium, heart healthy  Filed Weights   02/03/24 0307 02/04/24 0500 02/05/24 0526  Weight: 51.5 kg 50.6 kg 50.1 kg    History of present illness:   33 y.o. M with PMH significant for spina bifida, paraplegia, bicuspid aortic valve, HF with last EF 50-55% and neurogenic bladder and self catheterizes and uses almost daily enemas at home for chronic constipation, was recently admitted to [facility] and discharged on 8/15 for septic shock, workup was largely unremarkable, eventually suspected to have pneumonia, discharged on a regimen of Augmentin  yesterday. - Patient reports that after he went home he was unable to self catheterize as his abdomen was so distended he could not see his genitalia, in addition he had significant scrotal edema, his penis was buried in the scrotal swelling. - Family also had difficulty inserting water enema tube. - Patient reports increased generalized swellings following his recent hospitalization  In the ED afebrile, mildly hypoxic, placed on 2 L O2, no fever or leukocytosis, creatinine 0.6, WBC 8, chest x-ray with mild pulmonary vascular congestion  Hospital Course:   Acute on chronic diastolic CHF Last echo 7/25 with preserved EF, normal RV, bicuspid aortic valve - Improving with diuresis,  4.4 L negative  - transition back to oral Lasix  - Continue losartan , Aldactone , avoid SGLT2i with bladder issues -Follow-up with [facility], message sent to request close follow-up  Neurogenic bladder Self catheterizes at home at baseline   Chronic constipation, neurogenic bowel - Uses tapwater enema every day at home, - Enemas used while inpatient as well   Spina bifida Paraplegia -Chronic   Recent sepsis - Brief hypotension at the time, required pressors for a few hours on admission, treated with broad-spectrum antibiotics, all cultures negative, discharged on Augmentin  to complete 5-day course, now completed antibiotics  Discharge Exam: Vitals:   02/05/24 0817 02/05/24 0939  BP: 106/65 106/73  Pulse: 88 84  Resp: 19   Temp: 97.6 F (36.4 C)   SpO2: 97%    General exam: Appears calm and comfortable  Respiratory system: Decreased breath sounds to bases Cardiovascular system: S1 & S2 heard, RRR.  Abd: Distended, soft, nontender, bowel sounds present.  GU: Improving scrotal edema Central nervous system: Alert and oriented.  Spina bifida with deficits Extremities: Trace edema Skin: No rashes Psychiatry:  Mood & affect appropriate.     Discharge Instructions   Discharge Instructions     Diet - low sodium heart healthy   Complete by: As directed    Increase activity slowly   Complete by: As directed       Allergies as of 02/05/2024       Reactions   Morphine And Codeine Other (See Comments)   Hallucinations    Septra [sulfamethoxazole-trimethoprim] Other (See Comments)   Gi upset    Sulfamethoxazole Other (See Comments)   Gi upset  Trimethoprim Other (See Comments)   Unknown    Latex Swelling, Rash        Medication List     STOP taking these medications    amoxicillin -clavulanate 875-125 MG tablet Commonly known as: AUGMENTIN        TAKE these medications    acetaminophen  500 MG tablet Commonly known as: TYLENOL  Take 500 mg by mouth  as needed for moderate pain (pain score 4-6) or mild pain (pain score 1-3).   cetirizine 10 MG tablet Commonly known as: ZYRTEC Take 10 mg by mouth daily.   docusate sodium  100 MG capsule Commonly known as: COLACE Take 1 capsule (100 mg total) by mouth 2 (two) times daily as needed for mild constipation.   furosemide  40 MG tablet Commonly known as: LASIX  Take 1 tablet (40 mg total) by mouth daily. You may take an additional 40 mg dose for leg swelling or shortness of breath in the evening.   losartan  25 MG tablet Commonly known as: COZAAR  Take 0.5 tablets (12.5 mg total) by mouth daily.   metoprolol  succinate 50 MG 24 hr tablet Commonly known as: Toprol  XL Take 1 tablet (50 mg total) by mouth daily.   multivitamin with minerals tablet Take 1 tablet by mouth daily.   oxybutynin  15 MG 24 hr tablet Commonly known as: DITROPAN  XL Take 15 mg by mouth at bedtime.   polyethylene glycol 17 g packet Commonly known as: MIRALAX  / GLYCOLAX  Take 17 g by mouth daily as needed for moderate constipation.   Potassium Chloride  ER 20 MEQ Tbcr Take 1 tablet (20 mEq total) by mouth daily.   spironolactone  25 MG tablet Commonly known as: ALDACTONE  Take 0.5 tablets (12.5 mg total) by mouth daily.   Super Calcium  1500 (600 Ca) MG Tabs tablet Generic drug: calcium  carbonate Take 600 mg of elemental calcium  by mouth daily with breakfast.   surgical lubricant gel Apply 1 Application topically as needed (for cath).   VITAMIN D PO Take 1 capsule by mouth daily.       Allergies  Allergen Reactions   Morphine And Codeine Other (See Comments)    Hallucinations    Septra [Sulfamethoxazole-Trimethoprim] Other (See Comments)    Gi upset    Sulfamethoxazole Other (See Comments)    Gi upset    Trimethoprim Other (See Comments)    Unknown    Latex Swelling and Rash      The results of significant diagnostics from this hospitalization (including imaging, microbiology, ancillary and  laboratory) are listed below for reference.    Significant Diagnostic Studies: DG Abd 1 View Result Date: 02/03/2024 CLINICAL DATA:  Abdominal distension EXAM: ABDOMEN - 1 VIEW COMPARISON:  CT [id number] FINDINGS: VP shunt catheter tip in the right pelvis. Extensive spinal fusion hardware. Left convex scoliosis. Nonobstructive bowel-gas pattern. No radiopaque calculi. Chronic subluxation right femoral head IMPRESSION: Nonobstructive bowel-gas pattern. Electronically Signed   By: Norman Gatlin M.D.   On: 02/03/2024 00:28   DG Chest Portable 1 View Result Date: 02/02/2024 CLINICAL DATA:  Short of breath.  Groin swelling. EXAM: PORTABLE CHEST 1 VIEW COMPARISON:  Radiograph 01/28/2024 FINDINGS: Posterior thoracolumbar fusion. Severe scoliosis noted. VP shunt catheter along the RIGHT chest wall. Low lung volumes. Mild venous congestion. No pneumothorax. No focal consolidation. IMPRESSION: Low lung volumes and mild venous congestion. Electronically Signed   By: Jackquline Boxer M.D.   On: 02/02/2024 17:10   CT Angio Chest Pulmonary Embolism (PE) W or WO Contrast Result Date: 01/29/2024 CLINICAL DATA:  Pulmonary embolism (PE) suspected, high prob. Fever and weakness EXAM: CT ANGIOGRAPHY CHEST WITH CONTRAST TECHNIQUE: Multidetector CT imaging of the chest was performed using the standard protocol during bolus administration of intravenous contrast. Multiplanar CT image reconstructions and MIPs were obtained to evaluate the vascular anatomy. RADIATION DOSE REDUCTION: This exam was performed according to the departmental dose-optimization program which includes automated exposure control, adjustment of the mA and/or kV according to patient size and/or use of iterative reconstruction technique. CONTRAST:  75mL OMNIPAQUE  IOHEXOL  350 MG/ML SOLN COMPARISON:  CT heart 10/05/2022 FINDINGS: Cardiovascular: Satisfactory opacification of the pulmonary arteries to the segmental level. No evidence of pulmonary embolism. No  central or segmental pulmonary embolus. Limited evaluation more distally due to motion artifact. The main pulmonary artery measures at the upper limits of normal. Normal heart size. No significant pericardial effusion. Limited evaluation of the aortic root that appears grossly stable in caliber measuring about 4.1 cm. Otherwise the thoracic aorta is normal in caliber. No atherosclerotic plaque of the thoracic aorta. No coronary artery calcifications. Mediastinum/Nodes: No enlarged mediastinal, hilar, or axillary lymph nodes. Thyroid gland, trachea, and esophagus demonstrate no significant findings. Lungs/Pleura: Expiratory phase of respiration with low lung volumes. Bilateral lower lobe passive atelectasis. No focal consolidation. No pulmonary nodule. No pulmonary mass. Question trace bilateral pleural effusions. No pneumothorax. Upper Abdomen: No acute abnormality. Musculoskeletal: Severe levoscoliosis of the thoracolumbar spine with compensatory dextroscoliosis of the thoracic spine. Right chest wall tubing likely related to a ventriculoperitoneal shunt. Review of the MIP images confirms the above findings. IMPRESSION: 1. No central or segmental pulmonary embolus. Limited evaluation more distally due to motion artifact. 2. Question trace bilateral pleural effusions. 3. Limited evaluation of the aortic root that appears grossly stable in caliber measuring about 4.1 cm. Electronically Signed   By: Morgane  Naveau M.D.   On: 01/29/2024 19:28   CT ABDOMEN PELVIS W CONTRAST Addendum Date: 01/28/2024 ADDENDUM REPORT: 01/28/2024 19:51 ADDENDUM: There is an dictation error in the body of the report. This is a male patient. The prostate gland and seminal vesicles are unremarkable. Electronically Signed   By: Dorethia Molt M.D.   On: 01/28/2024 19:51   Result Date: 01/28/2024 CLINICAL DATA:  Sepsis EXAM: CT ABDOMEN AND PELVIS WITH CONTRAST TECHNIQUE: Multidetector CT imaging of the abdomen and pelvis was performed  using the standard protocol following bolus administration of intravenous contrast. RADIATION DOSE REDUCTION: This exam was performed according to the departmental dose-optimization program which includes automated exposure control, adjustment of the mA and/or kV according to patient size and/or use of iterative reconstruction technique. CONTRAST:  80mL OMNIPAQUE  IOHEXOL  300 MG/ML  SOLN COMPARISON:  None Available. FINDINGS: Lower chest: No acute abnormality.  Moderate cardiomegaly Hepatobiliary: No focal liver abnormality is seen. No gallstones, gallbladder wall thickening, or biliary dilatation. Pancreas: Unremarkable Spleen: Unremarkable Adrenals/Urinary Tract: Adrenal glands are unremarkable. Kidneys are normal, without renal calculi, focal lesion, or hydronephrosis. Bladder is unremarkable. Stomach/Bowel: Stomach is within normal limits. Appendix appears normal. No evidence of bowel wall thickening, distention, or inflammatory changes. Vascular/Lymphatic: No significant vascular findings are present. No enlarged abdominal or pelvic lymph nodes. Reproductive: Uterus and bilateral adnexa are unremarkable. Other: Ventriculoperitoneal shunt catheter tubing is seen within the right lower quadrant peritoneal cavity. Musculoskeletal: Severe thoracolumbar scoliosis status post operative stabilization. Spina bifida. 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 No acute abnormality. IMPRESSION: 1. No acute intra-abdominal pathology identified. No definite radiographic explanation for the patient's reported symptoms. 2. Moderate  cardiomegaly. 3. Severe thoracolumbar scoliosis status post operative stabilization. Spina bifida. 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 Electronically Signed: By: Dorethia Molt M.D. On: 01/28/2024 03:39   DG Chest Port 1 View if patient is in a treatment room. Result Date: 01/28/2024 CLINICAL DATA:  Fever  and weakness EXAM: PORTABLE CHEST 1 VIEW COMPARISON:  11/03/2022 FINDINGS: Cardiac shadow is stable. Lungs are hypoinflated but clear. Extensive orthopedic hardware is noted within the thoracic and lumbar spine stable from the prior exam. Shunt catheter is noted over the right chest. No acute bony abnormality is seen. IMPRESSION: No acute abnormality noted. Electronically Signed   By: Oneil Devonshire M.D.   On: 01/28/2024 00:40   ECHOCARDIOGRAM COMPLETE Result Date: 01/08/2024    ECHOCARDIOGRAM REPORT   Patient Name:   Samuel Becker Date of Exam: 01/08/2024 Medical Rec #:  [id number]            Height:       64.0 in Accession #:    [id number]           Weight:       109.5 lb Date of Birth:  1990-09-29             BSA:          1.514 m Patient Age:    33 years             BP:           124/86 mmHg Patient Gender: M                    HR:           61 bpm. Exam Location:  Outpatient Procedure: 2D Echo, Cardiac Doppler, Color Doppler and Strain Analysis (Both            Spectral and Color Flow Doppler were utilized during procedure). Indications:    I35.9 Aortic Valve disorder  History:        Patient has prior history of Echocardiogram examinations, most                 recent 04/18/2023. Bicupsid aortic valve, Dilated aortic root.  Sonographer:    Waldo Guadalajara RCS Referring Phys: [phone] TRACI R TURNER IMPRESSIONS  1. Left ventricular ejection fraction, by estimation, is 50 to 55%. The left ventricle has low normal function. The left ventricle has no regional wall motion abnormalities. Left ventricular diastolic parameters were normal. The average left ventricular  global longitudinal strain is -17.6 %. The global longitudinal strain is abnormal.  2. Right ventricular systolic function is normal. The right ventricular size is normal.  3. The mitral valve is normal in structure. No evidence of mitral valve regurgitation. No evidence of mitral stenosis.  4. Sievers type 1 bicuspid aortic valve with left-right cusp  raphe. The aortic valve is bicuspid. Aortic valve regurgitation is mild. No aortic stenosis is present.  5. Aortic dilatation noted. There is mild dilatation of the aortic root, measuring 40 mm.  6. The inferior vena cava is normal in size with greater than 50% respiratory variability, suggesting right atrial pressure of 3 mmHg. Comparison(s): No significant change from prior study. Prior images reviewed side by side. FINDINGS  Left Ventricle: Left ventricular ejection fraction, by estimation, is 50 to 55%. The left ventricle has low normal function. The left ventricle has no regional wall motion abnormalities. The average left ventricular global longitudinal strain is -17.6 %. Strain  was performed and the global longitudinal strain is abnormal. The left ventricular internal cavity size was normal in size. There is no left ventricular hypertrophy. Left ventricular diastolic parameters were normal. Right Ventricle: The right ventricular size is normal. No increase in right ventricular wall thickness. Right ventricular systolic function is normal. Left Atrium: Left atrial size was normal in size. Right Atrium: Right atrial size was normal in size. Pericardium: There is no evidence of pericardial effusion. Mitral Valve: The mitral valve is normal in structure. No evidence of mitral valve regurgitation. No evidence of mitral valve stenosis. Tricuspid Valve: The tricuspid valve is normal in structure. Tricuspid valve regurgitation is not demonstrated. No evidence of tricuspid stenosis. Aortic Valve: Sievers type 1 bicuspid aortic valve with left-right cusp raphe. The aortic valve is bicuspid. Aortic valve regurgitation is mild. Aortic regurgitation PHT measures 786 msec. No aortic stenosis is present. Aortic valve mean gradient measures 2.0 mmHg. Aortic valve peak gradient measures 3.7 mmHg. Aortic valve area, by VTI measures 2.58 cm. Pulmonic Valve: The pulmonic valve was not well visualized. Pulmonic valve  regurgitation is not visualized. No evidence of pulmonic stenosis. Aorta: Aortic dilatation noted. There is mild dilatation of the aortic root, measuring 40 mm. Venous: The inferior vena cava is normal in size with greater than 50% respiratory variability, suggesting right atrial pressure of 3 mmHg. IAS/Shunts: No atrial level shunt detected by color flow Doppler.  LEFT VENTRICLE PLAX 2D LVIDd:         3.60 cm   Diastology LVIDs:         2.70 cm   LV e' medial:    5.00 cm/s LV PW:         0.90 cm   LV E/e' medial:  12.1 LV IVS:        0.80 cm   LV e' lateral:   5.66 cm/s LVOT diam:     2.00 cm   LV E/e' lateral: 10.7 LV SV:         47 LV SV Index:   31        2D Longitudinal Strain LVOT Area:     3.14 cm  2D Strain GLS Avg:     -17.6 %  RIGHT VENTRICLE RV Basal diam:  2.50 cm RV S prime:     10.40 cm/s TAPSE (M-mode): 1.5 cm LEFT ATRIUM           Index        RIGHT ATRIUM          Index LA diam:      1.70 cm 1.12 cm/m   RA Area:     8.71 cm LA Vol (A2C): 23.4 ml 15.45 ml/m  RA Volume:   16.40 ml 10.83 ml/m LA Vol (A4C): 24.5 ml 16.18 ml/m  AORTIC VALVE AV Area (Vmax):    2.12 cm AV Area (Vmean):   2.20 cm AV Area (VTI):     2.58 cm AV Vmax:           95.70 cm/s AV Vmean:          62.100 cm/s AV VTI:            0.183 m AV Peak Grad:      3.7 mmHg AV Mean Grad:      2.0 mmHg LVOT Vmax:         64.70 cm/s LVOT Vmean:        43.400 cm/s LVOT VTI:  0.150 m LVOT/AV VTI ratio: 0.82 AI PHT:            786 msec  AORTA Ao Root diam: 3.70 cm Ao Asc diam:  3.25 cm MITRAL VALVE MV Area (PHT):             SHUNTS MV Decel Time:             Systemic VTI:  0.15 m MV E velocity: 60.30 cm/s  Systemic Diam: 2.00 cm MV A velocity: 41.90 cm/s MV E/A ratio:  1.44 Mihai Croitoru MD Electronically signed by Jerel Balding MD Signature Date/Time: 01/08/2024/12:57:10 PM    Final     Microbiology: Recent Results (from the past 240 hours)  Culture, blood (Routine x 2)     Status: None   Collection Time: 01/27/24 11:18 PM    Specimen: BLOOD  Result Value Ref Range Status   Specimen Description   Final    BLOOD BLOOD LEFT HAND Performed at [facility], [address] [place], [place] [address]    Special Requests   Final    BOTTLES DRAWN AEROBIC AND ANAEROBIC Blood Culture adequate volume Performed at [facility], [address] [place], [place] [address]    Culture   Final    NO GROWTH 5 DAYS Performed at [facility], [address], [place], [place] [address]    Report Status 02/02/2024 FINAL  Final  Resp panel by RT-PCR (RSV, Flu A&B, Covid) Anterior Nasal Swab     Status: None   Collection Time: 01/27/24 11:18 PM   Specimen: Anterior Nasal Swab  Result Value Ref Range Status   SARS Coronavirus 2 by RT PCR NEGATIVE NEGATIVE Final    Comment: (NOTE) SARS-CoV-2 target nucleic acids are NOT DETECTED.  The SARS-CoV-2 RNA is generally detectable in upper respiratory specimens during the acute phase of infection. The lowest concentration of SARS-CoV-2 viral copies this assay can detect is 138 copies/mL. A negative result does not preclude SARS-Cov-2 infection and should not be used as the sole basis for treatment or other patient management decisions. A negative result may occur with  improper specimen collection/handling, submission of specimen other than nasopharyngeal swab, presence of viral mutation(s) within the areas targeted by this assay, and inadequate number of viral copies(<138 copies/mL). A negative result must be combined with clinical observations, patient history, and epidemiological information. The expected result is Negative.  Fact Sheet for Patients:  BloggerCourse.com  Fact Sheet for Healthcare Providers:  SeriousBroker.it  This test is no t yet approved or cleared by the [place]  FDA and  has been authorized for detection and/or diagnosis of SARS-CoV-2 by FDA under an  Emergency Use Authorization (EUA). This EUA will remain  in effect (meaning this test can be used) for the duration of the COVID-19 declaration under Section 564(b)(1) of the Act, 21 U.S.C.section 360bbb-3(b)(1), unless the authorization is terminated  or revoked sooner.       Influenza A by PCR NEGATIVE NEGATIVE Final   Influenza B by PCR NEGATIVE NEGATIVE Final    Comment: (NOTE) The Xpert Xpress SARS-CoV-2/FLU/RSV plus assay is intended as an aid in the diagnosis of influenza from Nasopharyngeal swab specimens and should not be used as a sole basis for treatment. Nasal washings and aspirates are unacceptable for Xpert Xpress SARS-CoV-2/FLU/RSV testing.  Fact Sheet for Patients: BloggerCourse.com  Fact Sheet for Healthcare Providers: SeriousBroker.it  This test is not yet approved or cleared by the [place]  FDA and has  been authorized for detection and/or diagnosis of SARS-CoV-2 by FDA under an Emergency Use Authorization (EUA). This EUA will remain in effect (meaning this test can be used) for the duration of the COVID-19 declaration under Section 564(b)(1) of the Act, 21 U.S.C. section 360bbb-3(b)(1), unless the authorization is terminated or revoked.     Resp Syncytial Virus by PCR NEGATIVE NEGATIVE Final    Comment: (NOTE) Fact Sheet for Patients: BloggerCourse.com  Fact Sheet for Healthcare Providers: SeriousBroker.it  This test is not yet approved or cleared by the [place]  FDA and has been authorized for detection and/or diagnosis of SARS-CoV-2 by FDA under an Emergency Use Authorization (EUA). This EUA will remain in effect (meaning this test can be used) for the duration of the COVID-19 declaration under Section 564(b)(1) of the Act, 21 U.S.C. section 360bbb-3(b)(1), unless the authorization is terminated or revoked.  Performed at [facility], [address], [place], [place] [address]   Culture, blood (Routine x 2)     Status: None   Collection Time: 01/28/24  2:18 AM   Specimen: BLOOD  Result Value Ref Range Status   Specimen Description   Final    BLOOD BLOOD RIGHT FOREARM Performed at [facility], [address] [place], [place] [address]    Special Requests   Final    BOTTLES DRAWN AEROBIC AND ANAEROBIC Blood Culture adequate volume Performed at [facility], [address] [place], [place] [address]    Culture   Final    NO GROWTH 5 DAYS Performed at [facility], [address], [place], [place] [address]    Report Status 02/02/2024 FINAL  Final  MRSA Next Gen by PCR, Nasal     Status: None   Collection Time: 01/28/24  6:13 AM   Specimen: Nasal Mucosa; Nasal Swab  Result Value Ref Range Status   MRSA by PCR Next Gen NOT DETECTED NOT DETECTED Final    Comment: (NOTE) The GeneXpert MRSA Assay (FDA approved for NASAL specimens only), is one component of a comprehensive MRSA colonization surveillance program. It is not intended to diagnose MRSA infection nor to guide or monitor treatment for MRSA infections. Test performance is not FDA approved in patients less than 47 years old. Performed at [facility], [address] [place], [place] [address]   Urine Culture (for pregnant, neutropenic or urologic patients or patients with an indwelling urinary catheter)     Status: None   Collection Time: 01/28/24  8:36 AM   Specimen: Urine, Catheterized  Result Value Ref Range Status   Specimen Description URINE, CATHETERIZED  Final   Special Requests NONE  Final   Culture   Final    NO GROWTH Performed at [facility], [address] [place], [place] [address]    Report Status 01/29/2024 FINAL  Final  Respiratory (~20 pathogens) panel by PCR     Status: None   Collection Time: 01/29/24 11:49 AM   Specimen: Nasopharyngeal Swab; Respiratory   Result Value Ref Range Status   Adenovirus NOT DETECTED NOT DETECTED Final   Coronavirus 229E NOT DETECTED NOT DETECTED Final    Comment: (NOTE) The Coronavirus on the Respiratory Panel, DOES NOT test for the novel  Coronavirus (2019 nCoV)    Coronavirus HKU1 NOT DETECTED NOT DETECTED Final   Coronavirus NL63 NOT DETECTED NOT DETECTED Final   Coronavirus OC43 NOT DETECTED NOT DETECTED Final   Metapneumovirus NOT DETECTED NOT DETECTED Final  Rhinovirus / Enterovirus NOT DETECTED NOT DETECTED Final   Influenza A NOT DETECTED NOT DETECTED Final   Influenza B NOT DETECTED NOT DETECTED Final   Parainfluenza Virus 1 NOT DETECTED NOT DETECTED Final   Parainfluenza Virus 2 NOT DETECTED NOT DETECTED Final   Parainfluenza Virus 3 NOT DETECTED NOT DETECTED Final   Parainfluenza Virus 4 NOT DETECTED NOT DETECTED Final   Respiratory Syncytial Virus NOT DETECTED NOT DETECTED Final   Bordetella pertussis NOT DETECTED NOT DETECTED Final   Bordetella Parapertussis NOT DETECTED NOT DETECTED Final   Chlamydophila pneumoniae NOT DETECTED NOT DETECTED Final   Mycoplasma pneumoniae NOT DETECTED NOT DETECTED Final    Comment: Performed at [facility], [address] [place], [place] [address]     Labs: Basic Metabolic Panel: Recent Labs  Lab 01/31/24 0344 02/01/24 0126 02/02/24 1656 02/03/24 1134 02/05/24 0222  NA 138 140 140 138 137  K 3.3* 3.8 3.9 4.1 4.0  CL 100 98 96* 92* 94*  CO2 29 28 31  34* 31  GLUCOSE 101* 93 110* 111* 97  BUN 5* 8 10 11 18   CREATININE 0.65 0.55* 0.60* 0.68 0.68  CALCIUM  8.3* 8.7* 10.0 9.3 9.3  MG  --   --   --  2.2  --   PHOS  --   --   --  4.2  --    Liver Function Tests: Recent Labs  Lab 02/02/24 1656 02/03/24 1134  AST 58* 95*  ALT 56* 93*  ALKPHOS 63 55  BILITOT 0.4 0.4  PROT 7.2 6.9  ALBUMIN 3.4* 3.3*   Recent Labs  Lab 02/02/24 1656  LIPASE 38   No results for input(s): AMMONIA in the last 168 hours. CBC: Recent Labs  Lab  01/30/24 0939 01/31/24 0344 02/01/24 0126 02/02/24 1656 02/03/24 1134  WBC 11.7* 8.9 7.1 8.2 7.8  NEUTROABS  --   --   --  5.0  --   HGB 11.6* 11.5* 11.9* 13.2 12.7*  HCT 35.4* 35.4* 37.3* 42.3 40.0  MCV 91.0 91.0 92.1 93.4 92.2  PLT 192 202 226 294 289   Cardiac Enzymes: No results for input(s): CKTOTAL, CKMB, CKMBINDEX, TROPONINI in the last 168 hours. BNP: BNP (last 3 results) Recent Labs    02/02/24 1656  BNP 34.6    ProBNP (last 3 results) Recent Labs    07/23/23 1545  PROBNP 64    CBG: Recent Labs  Lab 01/29/24 2140  GLUCAP 121*       Signed:  Sigurd Pac MD.  [facility] 02/05/2024, 11:14 AM

## 2024-02-05 NOTE — Plan of Care (Signed)

## 2024-02-05 NOTE — Plan of Care (Signed)
  Problem: Education: Goal: Knowledge of General Education information will improve Description: Including pain rating scale, medication(s)/side effects and non-pharmacologic comfort measures Outcome: Progressing   Problem: Health Behavior/Discharge Planning: Goal: Ability to manage health-related needs will improve Outcome: Progressing   Problem: Clinical Measurements: Goal: Ability to maintain clinical measurements within normal limits will improve Outcome: Progressing Goal: Diagnostic test results will improve Outcome: Progressing   Problem: Activity: Goal: Risk for activity intolerance will decrease Outcome: Progressing   Problem: Nutrition: Goal: Adequate nutrition will be maintained Outcome: Progressing   Problem: Pain Managment: Goal: General experience of comfort will improve and/or be controlled Outcome: Progressing   Problem: Safety: Goal: Ability to remain free from injury will improve Outcome: Progressing

## 2024-02-20 ENCOUNTER — Encounter: Payer: Self-pay | Admitting: *Deleted

## 2024-02-21 ENCOUNTER — Encounter: Payer: Self-pay | Admitting: *Deleted

## 2024-02-21 NOTE — Progress Notes (Signed)
 OFFICE NOTE:    Date:  02/22/2024  ID:  Samuel Becker, DOB [birth year], MRN [id number] PCP: Cleotilde Planas, MD  [facility] Providers Cardiologist:  Wilbert Bihari, MD Electrophysiologist:  Eulas FORBES Furbish, MD       (HFrEF) heart failure with reduced ejection fraction Non-ischemic cardiomyopathy (dx in setting of pneumonia, sepsis) TTE 10/03/22: EF 35-40, global HK, mildly reduced RVSF, mod BAE, Siever's Bicuspid AV, mild SoV dilation, R-L fusion and raphe, trivial AI, Ao root 43 mm, RAP 3 CCTA 10/05/22:  mild Ao root dilations (4.1 cm); CAC score 0; no CAD; total plaque volume < 1% TTE 12/26/2022: EF 30-35, GR 1 DD, normal RVSF, mild AI, aortic root 39 mm Monitor 02/2023: NSR, average heart rate 85, no arrhythmias Limited TTE 04/18/2023: EF 50-55, trivial MR, aortic root 41 mm Intol fo Entresto  due to ? BP TTE 01/08/2024: EF 50-55, no RWMA, normal RVSF, bicuspid AV, mild AI, no AS, aortic root 40 mm Bicuspid Aortic Valve Mild SoV dilation TTE 12/2023: 40 mm CT 01/29/2024: 4.1 cm Spina bifida  Neurogenic bladder          Discussed the use of AI scribe software for clinical note transcription with the patient, who gave verbal consent to proceed. History of Present Illness Samuel Becker is a 33 y.o. male who returns for posthospitalization follow-up.  He was admitted 8/10-8/15 with septic shock.  Source was unknown (UTI ruled out, CT abdomen/pelvis negative, CXR negative, MRSA screen negative, blood cultures negative).  He was treated with IV antibiotics initially and transition to oral antibiotics to cover for pneumonia.  Hospitalization was complicated by hyponatremia and hypokalemia managed with fluids and supplementation.  He was readmitted 8/16-8/19 with acute CHF/anasarca.  He was managed with IV diuretics, -4.4 L.  He was transition back to his oral diuretic and prior GDMT.  He has been monitoring his fluid intake and output at home to manage his condition. He  reports no shortness of breath, chest discomfort, or syncope.  He self catheterizes at home.  He has had some difficulty completely emptying his bladder recently.  I have asked him to follow-up with urology for this.    ROS-See HPI    Studies Reviewed:      Labs-chart reviewed 02/05/2024: K 4, creatinine 0.68 02/03/2024: Albumin 3.3, ALT 93, Hgb 12.7 01/30/2024: TSH 1.838         Physical Exam:  VS:  BP 102/70   Pulse 86   Ht 5' 4 (1.626 m)   Wt 109 lb 14.4 oz (49.9 kg)   SpO2 99%   BMI 18.86 kg/m        Wt Readings from Last 3 Encounters:  02/22/24 109 lb 14.4 oz (49.9 kg)  02/05/24 110 lb 7.2 oz (50.1 kg)  02/01/24 119 lb 11.4 oz (54.3 kg)    Constitutional:      Appearance: Not in distress.  Neck:     Vascular: JVD normal.  Cardiovascular:     Normal rate. Regular rhythm.     Murmurs: There is no murmur.  Edema:    Peripheral edema absent.  Abdominal:     Palpations: Abdomen is soft.        Assessment and Plan:    Assessment & Plan Heart failure with improved ejection fraction (HFimpEF) (HCC) Secondary to non-ischemic cardiomyopathy. Initial ejection fraction was 35-40% in April 2024, improved to 50-55% by Limited echo in October 2024.  Most recent TTE in 12/2023 was stable, low  normal EF at 50-55.  His initial diagnosis was in the setting of pneumonia and sepsis.  Question if his cardiomyopathy was related to this.  Low BP has limited GDMT.  He was admitted last month with sepsis.  He was readmitted with volume overload, likely due to resuscitation when he was admitted with sepsis.  Since discharge, he has done well without significant increase in volume.  He has taken a couple of extra doses of furosemide  20 mg due to some abdominal distention.  This is resolved.  Overall, from a volume standpoint, he seems to be stable. -Continue furosemide  40 mg daily with extra 20-40 mg as needed, losartan  12.5 mg daily, metoprolol  succinate 50 mg daily, K+ 20 mEq daily,  spironolactone  12.5 mg daily -Keep follow-up with Dr. Shlomo next month as planned Bicuspid aortic valve Mild AI by TTE in 12/2023.  Continue with annual echocardiogram for surveillance. Dilated aortic root (HCC) CTA in 01/2024 stable at 4.1 cm.  Continue annual surveillance with echocardiogram.         Dispo:  Return in 6 weeks (on 04/07/2024) for Scheduled Follow Up, w/ Dr. Shlomo.  Signed, Glendia Ferrier, PA-C

## 2024-02-21 NOTE — Assessment & Plan Note (Signed)
 CTA in 01/2024 stable at 4.1 cm.***

## 2024-02-21 NOTE — Assessment & Plan Note (Signed)
 Mild AI by TTE in 12/2023.***

## 2024-02-21 NOTE — Assessment & Plan Note (Signed)
 Secondary to non-ischemic cardiomyopathy. Initial ejection fraction was 35-40% in April 2024, improved to 50-55% by Limited echo in October 2024.  Most recent TTE in 12/2023 was stable, low normal EF at 50-55.  His initial diagnosis was in the setting of pneumonia and sepsis.  Question if his cardiomyopathy was related to this.  Low BP has limited GDMT.  He was admitted last month with sepsis.  He was readmitted with volume overload, likely due to resuscitation when he was admitted with sepsis. ***

## 2024-02-22 ENCOUNTER — Encounter: Payer: Self-pay | Admitting: Physician Assistant

## 2024-02-22 ENCOUNTER — Ambulatory Visit: Attending: Physician Assistant | Admitting: Physician Assistant

## 2024-02-22 VITALS — BP 102/70 | HR 86 | Ht 64.0 in | Wt 109.9 lb

## 2024-02-22 DIAGNOSIS — I77810 Thoracic aortic ectasia: Secondary | ICD-10-CM | POA: Diagnosis present

## 2024-02-22 DIAGNOSIS — Q2381 Bicuspid aortic valve: Secondary | ICD-10-CM | POA: Diagnosis not present

## 2024-02-22 DIAGNOSIS — I5032 Chronic diastolic (congestive) heart failure: Secondary | ICD-10-CM | POA: Insufficient documentation

## 2024-02-22 NOTE — Patient Instructions (Signed)
 Medication Instructions:  Your physician recommends that you continue on your current medications as directed. Please refer to the Current Medication list given to you today.  *If you need a refill on your cardiac medications before your next appointment, please call your pharmacy*  Lab Work: None ordered  If you have labs (blood work) drawn today and your tests are completely normal, you will receive your results only by: MyChart Message (if you have MyChart) OR A paper copy in the mail If you have any lab test that is abnormal or we need to change your treatment, we will call you to review the results.  Testing/Procedures: None ordered  Follow-Up: At [facility], you and your health needs are our priority.  As part of our continuing mission to provide you with exceptional heart care, our providers are all part of one team.  This team includes your primary Cardiologist (physician) and Advanced Practice Providers or APPs (Physician Assistants and Nurse Practitioners) who all work together to provide you with the care you need, when you need it.  Your next appointment:   As scheduled   Provider:   Wilbert Bihari, MD    We recommend signing up for the patient portal called MyChart.  Sign up information is provided on this After Visit Summary.  MyChart is used to connect with patients for Virtual Visits (Telemedicine).  Patients are able to view lab/test results, encounter notes, upcoming appointments, etc.  Non-urgent messages can be sent to your provider as well.   To learn more about what you can do with MyChart, go to [email or web address].   Other Instructions

## 2024-03-24 ENCOUNTER — Other Ambulatory Visit (HOSPITAL_COMMUNITY)

## 2024-04-07 ENCOUNTER — Ambulatory Visit: Attending: Cardiology | Admitting: Cardiology

## 2024-04-07 ENCOUNTER — Encounter: Payer: Self-pay | Admitting: Cardiology

## 2024-04-07 VITALS — BP 118/62 | HR 73 | Ht 64.0 in | Wt 110.0 lb

## 2024-04-07 DIAGNOSIS — Q2381 Bicuspid aortic valve: Secondary | ICD-10-CM | POA: Diagnosis not present

## 2024-04-07 DIAGNOSIS — I5020 Unspecified systolic (congestive) heart failure: Secondary | ICD-10-CM | POA: Diagnosis present

## 2024-04-07 DIAGNOSIS — I77810 Thoracic aortic ectasia: Secondary | ICD-10-CM | POA: Diagnosis not present

## 2024-04-07 NOTE — Patient Instructions (Signed)
 Medication Instructions:  The current medical regimen is effective;  continue present plan and medications.  *If you need a refill on your cardiac medications before your next appointment, please call your pharmacy*  Follow-Up: At [facility], you and your health needs are our priority.  As part of our continuing mission to provide you with exceptional heart care, our providers are all part of one team.  This team includes your primary Cardiologist (physician) and Advanced Practice Providers or APPs (Physician Assistants and Nurse Practitioners) who all work together to provide you with the care you need, when you need it.  Your next appointment:   6 month(s)  Provider:   Glendia Ferrier, PA-C      Then, Wilbert Bihari, MD will plan to see you again in 1 year(s).    We recommend signing up for the patient portal called MyChart.  Sign up information is provided on this After Visit Summary.  MyChart is used to connect with patients for Virtual Visits (Telemedicine).  Patients are able to view lab/test results, encounter notes, upcoming appointments, etc.  Non-urgent messages can be sent to your provider as well.   To learn more about what you can do with MyChart, go to [email or web address].

## 2024-04-07 NOTE — Progress Notes (Signed)
 Cardiology Office Note:    Date:  04/07/2024  ID:  RICHARDO POPOFF, DOB [birth year], MRN [id number] PCP: Cleotilde Planas, MD  [facility] Providers Cardiologist:  Wilbert Bihari, MD Electrophysiologist:  Eulas FORBES Furbish, MD       Patient Profile:      (HFrEF) heart failure with reduced ejection fraction Non-ischemic cardiomyopathy (dx in setting of pneumonia, sepsis) TTE 10/03/22: EF 35-40, global HK, mildly reduced RVSF, mod BAE, Siever's Bicuspid AV, mild SoV dilation, R-L fusion and raphe, trivial AI, Ao root 43 mm, RAP 3 CCTA 10/05/22:  mild Ao root dilations (4.1 cm); CAC score 0; no CAD; total plaque volume < 1% TTE 12/26/2022: EF 30-35, GR 1 DD, normal RVSF, mild AI, aortic root 39 mm Monitor 02/2023: NSR, average heart rate 85, no arrhythmias Limited TTE 04/18/2023: EF 50-55, trivial MR, aortic root 41 mm Intol fo Entresto  due to ? BP Bicuspid Aortic Valve Mild SoV dilation Spina bifida  Neurogenic bladder             History of Present Illness Samuel ERIKSSON is a 33 y.o. male who returns for follow up of CHF.  He was seen by Glendia Ferrier, PA on 02/22/2024 and last EF assessment showed an improved EF by echo 01/08/2024 at 50 to 55%. GDMT has been limited by hypotension.  He cannot take the SGLT2 inhibitor secondary to neurogenic bladder.   Since I saw him last he had some problems with lower extremity edema and weight gain and was seen by Dr. Ladona.  His heart rate was elevated and Toprol  was increased to 50 mg daily.  He was seen recently 07/23/2023 by Dr. Ladona for lower extremity edema and weight gain.  NT-proBNP was normal. His office visit with Glendia Ferrier, PA his leg swelling had improved without any more episodes.  He does use a ventilator at night.    He is here today and is doing well.  He denies any chest pain or pressure, SOB, PND, or orthopnea, DOE, dizziness, syncope, palpitations.  He has occasional LE edema that is controlled with diuretics.   ROS-See  HPI    Studies Reviewed:        Results     Risk Assessment/Calculations:             Physical Exam:   VS:  BP 118/62   Pulse 73   Ht 5' 4 (1.626 m)   Wt 110 lb (49.9 kg)   SpO2 99%   BMI 18.88 kg/m    Wt Readings from Last 3 Encounters:  04/07/24 110 lb (49.9 kg)  02/22/24 109 lb 14.4 oz (49.9 kg)  02/05/24 110 lb 7.2 oz (50.1 kg)  GEN: Well nourished, well developed in no acute distress HEENT: Normal NECK: No JVD; No carotid bruits LYMPHATICS: No lymphadenopathy CARDIAC:RRR, no murmurs, rubs, gallops RESPIRATORY:  Clear to auscultation without rales, wheezing or rhonchi  ABDOMEN: Soft, non-tender, non-distended MUSCULOSKELETAL:  No edema; No deformity  SKIN: Warm and dry NEUROLOGIC:  Alert and oriented x 3 PSYCHIATRIC:  Normal affect      Assessment and Plan:  Heart failure with improved ejection fraction (HFimpEF) (HCC) -Secondary to non-ischemic cardiomyopathy.  -Initial ejection fraction was 35-40% in April 2024 -His initial diagnosis was in the setting of pneumonia and sepsis.  Question if his cardiomyopathy was related to this.   -EF improved to 50-55% by Limited echo in October 2024.   -Appears euvolemic on exam today with no further complaints  of LE edema -Continue Lasix  40 mg daily, spironolactone  12.5 mg daily and KCl 20 mEq daily with as needed refills -Continue Toprol  XL 50 mg daily and losartan  12.5 mg daily -I have personally reviewed and interpreted outside labs performed by patient's PCP which showed serum creatinine 0.68 and potassium 4 on 02/05/2024 -he has no edema on exam today>>continue Lasix  40mg  daily with an additional 40mg  PRN daily for LE edema  Elevated blood pressure reading -BP controlled at 118/62 mmHg  -Continue Toprol  XL 50 mg daily and losartan  12.5 mg daily as well as spironolactone  12.5 mg daily with as needed refills  Dilated aortic root (HCC) -Dilated aortic root measuring 41 mm on October 2024 echocardiogram.   -Continue  with annual echocardiograms for surveillance  Bicuspid aortic valve -Mild AI but no evidence of aortic stenosis on echo 12/2023 -continue serial echos      Dispo: Follow-up in 6 months with Glendia Ferrier, PA and me in 1 year  Signed, Wilbert Bihari, MD

## 2024-04-24 ENCOUNTER — Ambulatory Visit (INDEPENDENT_AMBULATORY_CARE_PROVIDER_SITE_OTHER): Admitting: Pulmonary Disease

## 2024-04-24 ENCOUNTER — Encounter (HOSPITAL_BASED_OUTPATIENT_CLINIC_OR_DEPARTMENT_OTHER): Payer: Self-pay | Admitting: Pulmonary Disease

## 2024-04-24 VITALS — BP 113/84 | HR 79 | Ht 64.0 in | Wt 110.0 lb

## 2024-04-24 DIAGNOSIS — J984 Other disorders of lung: Secondary | ICD-10-CM | POA: Diagnosis not present

## 2024-04-24 DIAGNOSIS — J9612 Chronic respiratory failure with hypercapnia: Secondary | ICD-10-CM | POA: Diagnosis not present

## 2024-04-24 DIAGNOSIS — J9621 Acute and chronic respiratory failure with hypoxia: Secondary | ICD-10-CM

## 2024-04-24 DIAGNOSIS — J9611 Chronic respiratory failure with hypoxia: Secondary | ICD-10-CM

## 2024-04-24 NOTE — Progress Notes (Signed)
 Subjective:   PATIENT ID: Samuel Becker GENDER: male DOB: 1991-02-28, MRN: [id number]  Chief Complaint  Patient presents with   Follow-up    8 month follow up    Reason for Visit: Follow-up  Samuel Becker is a 33 year old wheelchair bound male never smoker with spina bifida, functional paraplegia, neurogenic bladder, GERD who presents for follow-up. As baseline he is independent and able to self-transfer. Routinely volunteers. Presents with parents who provide additional pertinent history.  Initial consult He was recently hospitalized from 4/3-4/9 and readmitted 10/02/22-10/07/22 for pneumonia (discharge summaries reviewed) and heart failure requiring diuresis and antibiotics. Discharged on 2L. He currently only wears oxygen at night. During the daytime SpO2 on his wrist monitor is >90%. Denies shortness of breath, cough and wheezing. Back to baseline activity including upper body exercises and independent of ADLs.  12/14/22 Since our last visit he has been compliant with his nightly O2. He will pick up his ONO today. Denies shortness of breath, wheezing or cough.  Symptoms worsen whenever he is ill.  He is back volunteering at [employer] and exercising. Blood pressure was low today. Recently changed BP medications with cardiology.  03/15/23 Since our last visit he wears oxygen nightly. Has BiPAP and uses it when his reflux is not affecting him. On protonix  and continues to have problems at times. Denies shortness of breath, wheezing or cough. Back to his baseline activities of volunteering and working out daily. Mother presents and provides support and information.  08/30/23 Since our last visit he is overall doing well. No exacerbations/URI since our last visit. Denies shortness of breath, cough or wheezing. He reports compliance with his BiPAP nightly. Sometimes has shortness of breath with heavy exertion. He is volunteering. Not working out lately due to cold  weather.  04/24/24 Since our last visit he has had two hospitalizations for sepsis of unknown origin 01/27/24-02/01/24 and returned for heart failure 02/02/24-02/05/24. He complains of hoarse throat due to nebulizer use while hospitalized but this has persisted. Has weaned off oxygen. He reports SpO2 >94% with activity. Denies shortness of breath, cough or wheezing. Compliant with nocturnal vent.   Social History: Rarely smokes cigars  Past Medical History:  Diagnosis Date   Ascending aorta dilatation    40 mm by 2D echo 12/2023   Bicuspid aortic valve    HFrEF (heart failure with reduced ejection fraction) (HCC) 10/02/2022   Limited TTE 12/26/2022: EF 30-35, GR 1 DD, normal RVSF, bicuspid AV, mild AI, aortic root 39 mm, RAP 3     Spina bifida (HCC)      Family History  Problem Relation Age of Onset   Hypertension Father    Heart disease Maternal Grandmother    Heart attack Maternal Grandfather    Heart attack Paternal Grandmother    Heart attack Paternal Grandfather 73     Social History   Occupational History   Occupation: Acupuncturist at [employer]  Tobacco Use   Smoking status: Never    Passive exposure: Never   Smokeless tobacco: Never  Vaping Use   Vaping status: Never Used  Substance and Sexual Activity   Alcohol  use: Not Currently    Comment: occ   Drug use: Never   Sexual activity: Not Currently    Allergies  Allergen Reactions   Morphine And Codeine Other (See Comments)    Hallucinations    Septra [Sulfamethoxazole-Trimethoprim] Other (See Comments)    Gi upset    Sulfamethoxazole Other (See  Comments)    Gi upset    Trimethoprim Other (See Comments)    Unknown    Latex Swelling and Rash     Outpatient Medications Prior to Visit  Medication Sig Dispense Refill   acetaminophen  (TYLENOL ) 500 MG tablet Take 500 mg by mouth as needed for moderate pain (pain score 4-6) or mild pain (pain score 1-3).     calcium  carbonate (SUPER CALCIUM ) 1500 (600 Ca) MG TABS  tablet Take 600 mg of elemental calcium  by mouth daily with breakfast.     cetirizine (ZYRTEC) 10 MG tablet Take 10 mg by mouth daily.     docusate sodium  (COLACE) 100 MG capsule Take 1 capsule (100 mg total) by mouth 2 (two) times daily as needed for mild constipation. 10 capsule 0   furosemide  (LASIX ) 40 MG tablet Take 1 tablet (40 mg total) by mouth daily. You may take an additional 40 mg dose for leg swelling or shortness of breath in the evening. 120 tablet 3   losartan  (COZAAR ) 25 MG tablet Take 0.5 tablets (12.5 mg total) by mouth daily. 45 tablet 3   Lubricants (SURGICAL LUBRICANT) gel Apply 1 Application topically as needed (for cath).     metoprolol  succinate (TOPROL  XL) 50 MG 24 hr tablet Take 1 tablet (50 mg total) by mouth daily. 90 tablet 3   Multiple Vitamins-Minerals (MULTIVITAMIN WITH MINERALS) tablet Take 1 tablet by mouth daily.     oxybutynin  (DITROPAN  XL) 15 MG 24 hr tablet Take 15 mg by mouth at bedtime.     polyethylene glycol (MIRALAX  / GLYCOLAX ) 17 g packet Take 17 g by mouth daily as needed for moderate constipation. 14 each 0   Potassium Chloride  ER 20 MEQ TBCR Take 1 tablet (20 mEq total) by mouth daily. 90 tablet 3   spironolactone  (ALDACTONE ) 25 MG tablet Take 0.5 tablets (12.5 mg total) by mouth daily. 45 tablet 2   VITAMIN D PO Take 1 capsule by mouth daily.     No facility-administered medications prior to visit.    Review of Systems  Constitutional:  Negative for chills, diaphoresis, fever, malaise/fatigue and weight loss.  HENT:  Negative for congestion.   Respiratory:  Negative for cough, hemoptysis, sputum production, shortness of breath and wheezing.   Cardiovascular:  Negative for chest pain, palpitations and leg swelling.     Objective:   Vitals:   04/24/24 1511  BP: 113/84  Pulse: 79  SpO2: 98%  Weight: 110 lb (49.9 kg)  Height: 5' 4 (1.626 m)   SpO2: 98 %  Physical Exam: General: Well-appearing, no acute distress, wheel chair  bound HENT: [place], AT Eyes: EOMI, no scleral icterus Respiratory: Clear to auscultation bilaterally.  No crackles, wheezing or rales Cardiovascular: RRR, -M/R/G, no JVD Extremities:-Edema,-tenderness Neuro: AAO x4, CNII-XII grossly intact Psych: Normal mood, normal affect  Data Reviewed:  Imaging: CXR 10/07/22 - Diffuse bilateral infiltrates CXR 11/03/22 Improved/nearly resolved infiltrates  PFT: 12/14/22 FVC 1.14 (30%) FEV1 1.12 (35%) Ratio 95 TLC 50% DLCO 71% Interpretation: Very severe restrictive defect with mildly reduced DLCO.  No significant bronchodilator response   Labs: ABG    Component Value Date/Time   PHART 7.46 (H) 11/30/2022 1108   PCO2ART 45 11/30/2022 1108   PO2ART 79 (L) 11/30/2022 1108   HCO3 32.0 (H) 11/30/2022 1108   TCO2 30 09/20/2022 2031   ACIDBASEDEF 1.0 09/20/2022 2031   O2SAT 97.8 11/30/2022 1108   VBG 10/02/22 10/02/22 - 7.3/65/35 - chronic respiratory failure  Assessment & Plan:   Discussion: 33 year old wheelchair bound male never smoker with spina bifida, functional paraplegia, neurogenic bladder, GERD, chronic hypoxemic respiratory failure who presents for follow-up. Tolerating NIV nightly. Benefits from nocturnal vent in setting of neuromuscular weakness with FVC <50%.  Per Chest guidelines, patients would benefit from NIV with any fall in FVC to <80% of predicted with symptoms or FVC to <50% of predicted without symptoms or SNIP/MIP to <-40 cmg H20 or hypercapnia would support the initiation of NIV or further testing.   Chronic hypoxemic and hypercarbic respiratory failure Very severe restrictive defect secondary neuromuscular weakness (FVC 30%) --CONTINUE non-invasive ventilation nightly.  Email compliance report 30 days --DISCONTINUE oxygen. No longer needed --Will contact DME for compliance report  Patient's chronic respiratory failure due to diaphragmatic weakness in setting to paraplegia is life threatening.  Previous ABG's have  documented high PCO2 during acute illness.  Patient would benefit from non-invasive ventilation.  Without this therapy, the patient is at high risk of ending up with worsening symptoms, worsened respiratory failure, need for ER visits and/or recurrent hospitalizations.  Bilevel device unable to adequately support patient's nocturnal ventilation needs.  Patient would benefit from NIV therapy with set tidal volumes and pressure.   Health Maintenance Immunization History  Administered Date(s) Administered   DTaP 02/22/1990, 05/17/1990, 11/27/1990, 03/04/1993, 02/26/1995   Fluad Trivalent(High Dose 65+) 03/21/2011   HIB (PRP-T) 02/22/1990, 05/17/1990, 11/27/1990, 03/04/1993   HPV Quadrivalent 04/08/2009, 06/08/2009, 10/25/2009, 03/24/2013, 05/21/2014   Hepatitis A, Adult 11/16/2009   Hepatitis A, Ped/Adol-2 Dose 01/15/2009   Hepatitis B, PED/ADOLESCENT 11/09/2003, 12/11/2003, 07/11/2004   IPV 02/22/1990, 05/17/1990, 03/04/1993   Influenza Inj Mdck Quad With Preservative 04/08/2014   Influenza, Seasonal, Injecte, Preservative Fre 05/28/2023, 02/14/2024   Influenza,Quad,Nasal, Live 03/24/2013   Influenza,inj,Quad PF,6+ Mos 03/19/2015, 07/26/2017, 04/04/2022   Influenza,trivalent, recombinat, inj, PF 03/22/2012   MMR 04/10/1991, 02/26/1995   Meningococcal Acwy, Unspecified 11/09/2003   Meningococcal Conjugate 03/24/2013   PNEUMOCOCCAL CONJUGATE-20 05/28/2023   Pneumococcal Polysaccharide-23 11/16/2009   Td 08/14/2003   Tdap 05/21/2014   CT Lung Screen - not qualified  Orders Placed This Encounter  Procedures   Ambulatory Referral for DME    Referral Priority:   Routine    Referral Type:   Durable Medical Equipment Purchase    Number of Visits Requested:   1  No orders of the defined types were placed in this encounter.   Return in about 6 months (around 10/22/2024).  I have spent a total time of 32-minutes on the day of the appointment including chart review, data review, collecting  history, coordinating care and discussing medical diagnosis and plan with the patient/family. Past medical history, allergies, medications were reviewed. Pertinent imaging, labs and tests included in this note have been reviewed and interpreted independently by me.  Samuel Becker Staff, MD [facility] 04/24/2024 4:53 PM

## 2024-04-24 NOTE — Patient Instructions (Addendum)
 Chronic hypoxemic and hypercarbic respiratory failure Very severe restrictive defect secondary neuromuscular weakness (FVC 30%) --CONTINUE non-invasive ventilation nightly. Send compliance report 30 days through mychart --DME: [facility] --DISCONTINUE oxygen. No longer needed

## 2024-04-28 ENCOUNTER — Encounter (HOSPITAL_BASED_OUTPATIENT_CLINIC_OR_DEPARTMENT_OTHER): Payer: Self-pay | Admitting: Pulmonary Disease

## 2024-06-24 ENCOUNTER — Other Ambulatory Visit (HOSPITAL_BASED_OUTPATIENT_CLINIC_OR_DEPARTMENT_OTHER): Payer: Self-pay

## 2024-06-25 ENCOUNTER — Other Ambulatory Visit (HOSPITAL_BASED_OUTPATIENT_CLINIC_OR_DEPARTMENT_OTHER): Payer: Self-pay

## 2024-06-25 MED ORDER — LOSARTAN POTASSIUM 25 MG PO TABS
12.5000 mg | ORAL_TABLET | Freq: Every day | ORAL | 3 refills | Status: AC
  Filled 2024-07-02: qty 45, 90d supply, fill #0

## 2024-06-25 MED ORDER — OXYBUTYNIN CHLORIDE ER 15 MG PO TB24
15.0000 mg | ORAL_TABLET | Freq: Every day | ORAL | 6 refills | Status: AC
  Filled 2024-07-02: qty 30, 30d supply, fill #0

## 2024-07-01 ENCOUNTER — Other Ambulatory Visit (HOSPITAL_BASED_OUTPATIENT_CLINIC_OR_DEPARTMENT_OTHER): Payer: Self-pay

## 2024-07-02 ENCOUNTER — Other Ambulatory Visit (HOSPITAL_BASED_OUTPATIENT_CLINIC_OR_DEPARTMENT_OTHER): Payer: Self-pay

## 2024-07-03 ENCOUNTER — Other Ambulatory Visit (HOSPITAL_BASED_OUTPATIENT_CLINIC_OR_DEPARTMENT_OTHER): Payer: Self-pay

## 2024-07-04 ENCOUNTER — Other Ambulatory Visit: Payer: Self-pay | Admitting: Cardiology

## 2024-07-07 NOTE — Telephone Encounter (Signed)
 Chloride outside of Normal Range  In accordance with refill protocols, please review and address the following requirements before this medication refill can be authorized:  Labs

## 2025-01-23 ENCOUNTER — Ambulatory Visit (HOSPITAL_COMMUNITY)
